# Patient Record
Sex: Female | Born: 1965 | Race: Black or African American | Hispanic: No | Marital: Married | Smoking: Current every day smoker
Health system: Southern US, Community
[De-identification: ages and names within clinical notes are randomized; demographics above are authoritative.]

## PROBLEM LIST (undated history)

## (undated) DIAGNOSIS — R531 Weakness: Secondary | ICD-10-CM

## (undated) DIAGNOSIS — T7840XA Allergy, unspecified, initial encounter: Secondary | ICD-10-CM

## (undated) DIAGNOSIS — K219 Gastro-esophageal reflux disease without esophagitis: Secondary | ICD-10-CM

## (undated) DIAGNOSIS — J45909 Unspecified asthma, uncomplicated: Secondary | ICD-10-CM

## (undated) DIAGNOSIS — D649 Anemia, unspecified: Secondary | ICD-10-CM

## (undated) DIAGNOSIS — J309 Allergic rhinitis, unspecified: Secondary | ICD-10-CM

## (undated) DIAGNOSIS — E785 Hyperlipidemia, unspecified: Secondary | ICD-10-CM

## (undated) DIAGNOSIS — R55 Syncope and collapse: Secondary | ICD-10-CM

## (undated) DIAGNOSIS — R011 Cardiac murmur, unspecified: Secondary | ICD-10-CM

## (undated) DIAGNOSIS — J9801 Acute bronchospasm: Secondary | ICD-10-CM

## (undated) DIAGNOSIS — R748 Abnormal levels of other serum enzymes: Secondary | ICD-10-CM

## (undated) DIAGNOSIS — R42 Dizziness and giddiness: Secondary | ICD-10-CM

## (undated) DIAGNOSIS — R079 Chest pain, unspecified: Secondary | ICD-10-CM

## (undated) DIAGNOSIS — I10 Essential (primary) hypertension: Secondary | ICD-10-CM

## (undated) DIAGNOSIS — H538 Other visual disturbances: Secondary | ICD-10-CM

## (undated) DIAGNOSIS — Z720 Tobacco use: Secondary | ICD-10-CM

## (undated) HISTORY — DX: Chest pain, unspecified: R07.9

## (undated) HISTORY — DX: Allergic rhinitis, unspecified: J30.9

## (undated) HISTORY — PX: TUBAL LIGATION: SHX77

## (undated) HISTORY — DX: Tobacco use: Z72.0

## (undated) HISTORY — DX: Acute bronchospasm: J98.01

## (undated) HISTORY — DX: Unspecified asthma, uncomplicated: J45.909

## (undated) HISTORY — DX: Hyperlipidemia, unspecified: E78.5

## (undated) HISTORY — DX: Other visual disturbances: H53.8

## (undated) HISTORY — DX: Dizziness and giddiness: R42

## (undated) HISTORY — DX: Gastro-esophageal reflux disease without esophagitis: K21.9

## (undated) HISTORY — DX: Abnormal levels of other serum enzymes: R74.8

## (undated) HISTORY — DX: Essential (primary) hypertension: I10

## (undated) HISTORY — DX: Cardiac murmur, unspecified: R01.1

## (undated) HISTORY — DX: Anemia, unspecified: D64.9

## (undated) HISTORY — DX: Weakness: R53.1

## (undated) HISTORY — DX: Syncope and collapse: R55

## (undated) HISTORY — DX: Allergy, unspecified, initial encounter: T78.40XA

---

## 2009-04-02 ENCOUNTER — Emergency Department (HOSPITAL_COMMUNITY): Admission: EM | Admit: 2009-04-02 | Discharge: 2009-04-02 | Payer: Self-pay | Admitting: Emergency Medicine

## 2009-07-02 ENCOUNTER — Emergency Department (HOSPITAL_COMMUNITY): Admission: EM | Admit: 2009-07-02 | Discharge: 2009-07-02 | Payer: Self-pay | Admitting: Emergency Medicine

## 2010-06-10 ENCOUNTER — Emergency Department (HOSPITAL_COMMUNITY)
Admission: EM | Admit: 2010-06-10 | Discharge: 2010-06-10 | Disposition: A | Discharge status: Home / Self Care | Payer: Self-pay | Attending: Emergency Medicine | Admitting: Emergency Medicine

## 2010-06-10 DIAGNOSIS — M79609 Pain in unspecified limb: Secondary | ICD-10-CM | POA: Insufficient documentation

## 2010-06-10 LAB — DIFFERENTIAL
Basophils Relative: 1 % (ref 0–1)
Eosinophils Absolute: 0.1 10*3/uL (ref 0.0–0.7)
Eosinophils Relative: 2 % (ref 0–5)
Lymphocytes Relative: 37 % (ref 12–46)
Monocytes Absolute: 0.4 10*3/uL (ref 0.1–1.0)
Neutro Abs: 3.6 10*3/uL (ref 1.7–7.7)
Neutrophils Relative %: 54 % (ref 43–77)

## 2010-06-10 LAB — CBC
HCT: 35.6 % — ABNORMAL LOW (ref 36.0–46.0)
Hemoglobin: 12.3 g/dL (ref 12.0–15.0)
MCH: 31.9 pg (ref 26.0–34.0)

## 2010-06-10 LAB — BASIC METABOLIC PANEL
Calcium: 9.6 mg/dL (ref 8.4–10.5)
Creatinine, Ser: 0.63 mg/dL (ref 0.4–1.2)
GFR calc non Af Amer: >60 mL/min (ref >60)
Sodium: 134 mEq/L — ABNORMAL LOW (ref 135–145)

## 2019-09-14 DIAGNOSIS — R55 Syncope and collapse: Secondary | ICD-10-CM | POA: Insufficient documentation

## 2019-09-28 ENCOUNTER — Ambulatory Visit (INDEPENDENT_AMBULATORY_CARE_PROVIDER_SITE_OTHER): Payer: Self-pay | Admitting: Interventional Cardiology

## 2019-09-28 ENCOUNTER — Other Ambulatory Visit: Payer: Self-pay

## 2019-09-28 ENCOUNTER — Telehealth: Payer: Self-pay | Admitting: Radiology

## 2019-09-28 ENCOUNTER — Encounter: Payer: Self-pay | Admitting: Interventional Cardiology

## 2019-09-28 VITALS — BP 130/72 | HR 102 | Ht 64.0 in | Wt 169.8 lb

## 2019-09-28 DIAGNOSIS — I10 Essential (primary) hypertension: Secondary | ICD-10-CM

## 2019-09-28 DIAGNOSIS — E785 Hyperlipidemia, unspecified: Secondary | ICD-10-CM

## 2019-09-28 DIAGNOSIS — Z7189 Other specified counseling: Secondary | ICD-10-CM

## 2019-09-28 DIAGNOSIS — Z720 Tobacco use: Secondary | ICD-10-CM

## 2019-09-28 DIAGNOSIS — R079 Chest pain, unspecified: Secondary | ICD-10-CM

## 2019-09-28 DIAGNOSIS — R55 Syncope and collapse: Secondary | ICD-10-CM

## 2019-09-28 NOTE — Patient Instructions (Addendum)
Medication Instructions:  Your physician recommends that you continue on your current medications as directed. Please refer to the Current Medication list given to you today.  *If you need a refill on your cardiac medications before your next appointment, please call your pharmacy*   Lab Work: None If you have labs (blood work) drawn today and your tests are completely normal, you will receive your results only by: . MyChart Message (if you have MyChart) OR . A paper copy in the mail If you have any lab test that is abnormal or we need to change your treatment, we will call you to review the results.   Testing/Procedures: Your physician has recommended that you wear an event monitor. Event monitors are medical devices that record the heart's electrical activity. Doctors most often us these monitors to diagnose arrhythmias. Arrhythmias are problems with the speed or rhythm of the heartbeat. The monitor is a small, portable device. You can wear one while you do your normal daily activities. This is usually used to diagnose what is causing palpitations/syncope (passing out).    Follow-Up: At [facility], you and your health needs are our priority.  As part of our continuing mission to provide you with exceptional heart care, we have created designated Provider Care Teams.  These Care Teams include your primary Cardiologist (physician) and Advanced Practice Providers (APPs -  Physician Assistants and Nurse Practitioners) who all work together to provide you with the care you need, when you need it.  We recommend signing up for the patient portal called "MyChart".  Sign up information is provided on this After Visit Summary.  MyChart is used to connect with patients for Virtual Visits (Telemedicine).  Patients are able to view lab/test results, encounter notes, upcoming appointments, etc.  Non-urgent messages can be sent to your provider as well.   To learn more about what you can do with  MyChart, go to [email or web address].    Your next appointment:   As needed  The format for your next appointment:   In Person  Provider:   You may see Dr. Henry Smith or one of the following Advanced Practice Providers on your designated Care Team:    Lori Gerhardt, NP  Laura Ingold, NP  Jill McDaniel, NP    Other Instructions  [employer] Cardiac Event Monitor Instructions Your physician has requested you wear your cardiac event monitor for 30 days. [employer] may call or text to confirm a shipping address. The monitor will be sent to a land address via UPS. [employer] will not ship a monitor to a PO BOX. It typically takes 3-5 days to receive your monitor after it has been enrolled. [employer] will assist with USPS tracking if your package is delayed. The telephone number for [employer] is [phone]. Once you have received your monitor, please review the enclosed instructions. Instruction tutorials can also be viewed under help and settings on the enclosed cell phone. Your monitor has already been registered assigning a specific monitor serial # to you.  Applying the monitor Remove cell phone from case and turn it on. The cell phone works as your transmitter and needs to be within 10 feet of you at all times. The cell phone will need to be charged on a daily basis. We recommend you plug the cell phone into the enclosed charger at your bedside table every night.  Monitor batteries: You will receive two monitor batteries labelled #1 and #2. These are your recorders. Plug battery #2 onto the second connection   on the enclosed charger. Keep one battery on the charger at all times. This will keep the monitor battery deactivated. It will also keep it fully charged for when you need to switch your monitor batteries. A small light will be blinking on the battery emblem when it is charging. The light on the battery emblem will remain on when the battery is fully  charged.  Open package of a Monitor strip. Insert battery #1 into black hood on strip and gently squeeze monitor battery onto connection as indicated in instruction booklet. Set aside while preparing skin.  Choose location for your strip, vertical or horizontal, as indicated in the instruction booklet. Shave to remove all hair from location. There cannot be any lotions, oils, powders, or colognes on skin where monitor is to be applied. Wipe skin clean with enclosed Saline wipe. Dry skin completely.  Peel paper labeled #1 off the back of the Monitor strip exposing the adhesive. Place the monitor on the chest in the vertical or horizontal position shown in the instruction booklet. One arrow on the monitor strip must be pointing upward. Carefully remove paper labeled #2, attaching remainder of strip to your skin. Try not to create any folds or wrinkles in the strip as you apply it.  Firmly press and release the circle in the center of the monitor battery. You will hear a small beep. This is turning the monitor battery on. The heart emblem on the monitor battery will light up every 5 seconds if the monitor battery in turned on and connected to the patient securely. Do not push and hold the circle down as this turns the monitor battery off. The cell phone will locate the monitor battery. A screen will appear on the cell phone checking the connection of your monitor strip. This may read poor connection initially but change to good connection within the next minute. Once your monitor accepts the connection you will hear a series of 3 beeps followed by a climbing crescendo of beeps. A screen will appear on the cell phone showing the two monitor strip placement options. Touch the picture that demonstrates where you applied the monitor strip.  Your monitor strip and battery are waterproof. You are able to shower, bathe, or swim with the monitor on. They just ask you do not submerge deeper than 3 feet  underwater. We recommend removing the monitor if you are swimming in a lake, river, or ocean.  Your monitor battery will need to be switched to a fully charged monitor battery approximately once a week. The cell phone will alert you of an action which needs to be made.  On the cell phone, tap for details to reveal connection status, monitor battery status, and cell phone battery status. The green dots indicates your monitor is in good status. A red dot indicates there is something that needs your attention.  To record a symptom, click the circle on the monitor battery. In 30-60 seconds a list of symptoms will appear on the cell phone. Select your symptom and tap save. Your monitor will record a sustained or significant arrhythmia regardless of you clicking the button. Some patients do not feel the heart rhythm irregularities. [employer] will notify us of any serious or critical events.  Refer to instruction booklet for instructions on switching batteries, changing strips, the Do not disturb or Pause features, or any additional questions.  Call [employer] at [phone], to confirm your monitor is transmitting and record your baseline. They will answer any questions you   may have regarding the monitor instructions at that time.  Returning the monitor to [employer] Place all equipment back into blue box. Peel off strip of paper to expose adhesive and close box securely. There is a prepaid UPS shipping label on this box. Drop in a UPS drop box, or at a UPS facility like [employer]. You may also contact [employer] to arrange UPS to pick up monitor package at your home.  

## 2019-09-28 NOTE — Progress Notes (Signed)
Cardiology Office Note:    Date:  09/28/2019   ID:  Haley Nielsen, DOB November 16, 1965, MRN [id number]  PCP:  System, Pcp Not In  Cardiologist:  No primary care provider on file.   Referring MD: Health, Andre Lefort*   Chief Complaint  Patient presents with  . Chest Pain  . Hyperlipidemia  . Hypertension  . Loss of Consciousness    History of Present Illness:    Haley Nielsen is a 54 y.o. female with a hx of chest pain, being referred from the emergency department for further evaluation.  Medical problems include severe hyperlipidemia (potentially familial hypercholesterolemia), tobacco abuse, essential hypertension, presyncope/syncope, and history of bronchospasm.  This is a difficult situation in this female.  She is referred for chest discomfort.  She was seen in late August in the [facility] emergency room.  Delta troponin levels were unremarkable.  An EKG was done and was abnormal but it was related to lead position irregularity.  Repeat EKG today is absolutely normal.  She describes mid back discomfort lower back discomfort and neck stiffness.  She has been having sharp shooting pains around her lower rib cage.  The discomfort feels like electrical shocks and burning.  When it occurs it can last up to 5 minutes.  There is no associated shortness of breath.  The discomfort is not brought on by physical activity.  Coughing or sneezing can sometimes precipitate an episode.  This began occurring on September 14, 2019.  On 09/14/2019 she had an episode of syncope.  She was outdoors feeding her dog and the next thing she knew she was on the ground.  She had a similar episode the next day but fell onto bed.  There was no premonitory symptom.  She has not had fainting in the past.  Past Medical History:  Diagnosis Date  . Allergic rhinitis   . Blurry vision   . Bronchospasm   . Chest pain   . Dizziness   . Elevated CK-MB level   . GERD (gastroesophageal reflux disease)   .  Hyperlipidemia   . Hypertension   . Syncope   . Syncope   . Tobacco abuse   . Weakness     History reviewed. No pertinent surgical history.  Current Medications: Current Meds  Medication Sig  . albuterol (VENTOLIN HFA) 108 (90 Base) MCG/ACT inhaler Inhale into the lungs every 6 (six) hours as needed for wheezing or shortness of breath.  Marland Kitchen amLODipine (NORVASC) 10 MG tablet Take 10 mg by mouth daily.  Marland Kitchen aspirin EC 81 MG tablet Take 81 mg by mouth daily. Swallow whole.  . fluticasone (FLOVENT DISKUS) 50 MCG/BLIST diskus inhaler Inhale 1 puff into the lungs 2 (two) times daily.  Marland Kitchen lisinopril-hydrochlorothiazide (ZESTORETIC) 20-25 MG tablet Take 1 tablet by mouth daily.  Marland Kitchen loratadine (CLARITIN) 10 MG tablet Take 10 mg by mouth daily.  Marland Kitchen lovastatin (MEVACOR) 20 MG tablet Take 20 mg by mouth at bedtime.  . naproxen sodium (ALEVE) 220 MG tablet Take 220 mg by mouth.  . Omega-3 Fatty Acids (FISH OIL) 1000 MG CAPS Take by mouth.  . pantoprazole (PROTONIX) 40 MG tablet Take 40 mg by mouth daily.     Allergies:   Penicillins   Social History   Socioeconomic History  . Marital status: Married    Spouse name: Not on file  . Number of children: Not on file  . Years of education: Not on file  . Highest education level: Not on file  Occupational History  . Not on file  Tobacco Use  . Smoking status: Current Some Day Smoker  . Smokeless tobacco: Never Used  Substance and Sexual Activity  . Alcohol use: Not on file  . Drug use: Not on file  . Sexual activity: Not on file  Other Topics Concern  . Not on file  Social History Narrative  . Not on file   Social Determinants of Health   Financial Resource Strain:   . Difficulty of Paying Living Expenses: Not on file  Food Insecurity:   . Worried About Charity fundraiser in the Last Year: Not on file  . Ran Out of Food in the Last Year: Not on file  Transportation Needs:   . Lack of Transportation (Medical): Not on file  . Lack of  Transportation (Non-Medical): Not on file  Physical Activity:   . Days of Exercise per Week: Not on file  . Minutes of Exercise per Session: Not on file  Stress:   . Feeling of Stress : Not on file  Social Connections:   . Frequency of Communication with Friends and Family: Not on file  . Frequency of Social Gatherings with Friends and Family: Not on file  . Attends Religious Services: Not on file  . Active Member of Clubs or Organizations: Not on file  . Attends Archivist Meetings: Not on file  . Marital Status: Not on file     Family History: The patient's family history is not on file.  ROS:   Please see the history of present illness.    She smokes cigarettes, 3/day.  Has lower back and mid back discomfort.  Stiff neck.  All other systems reviewed and are negative.  EKGs/Labs/Other Studies Reviewed:    The following studies were reviewed today: Recent chest x-ray done at [facility] did not reveal evidence of bony abnormality or rib fracture.  EKG:  EKG reveals normal sinus rhythm today with normal appearance.  In contrast the EKG performed in the emergency room at [facility] appeared abnormal because of abnormal lead placement with Q waves in lead I and aVL and upright lead in aVR suggesting left arm right arm switch.  Recent Labs: No results found for requested labs within last 8760 hours.  Recent Lipid Panel No results found for: CHOL, TRIG, HDL, CHOLHDL, VLDL, LDLCALC, LDLDIRECT  Physical Exam:    VS:  BP 130/72   Pulse (!) 102   Ht 5\' 4"  (1.626 m)   Wt 169 lb 12.8 oz (77 kg)   SpO2 97%   BMI 29.15 kg/m     Wt Readings from Last 3 Encounters:  09/28/19 169 lb 12.8 oz (77 kg)     GEN: Appears older than stated age. No acute distress HEENT: Normal NECK: No JVD. LYMPHATICS: No lymphadenopathy CARDIAC:  RRR without murmur, gallop, or edema. VASCULAR:  Normal Pulses. No bruits. RESPIRATORY:  Clear to auscultation without rales, wheezing or  rhonchi  ABDOMEN: Soft, non-tender, non-distended, No pulsatile mass, MUSCULOSKELETAL: No deformity  SKIN: Warm and dry NEUROLOGIC:  Alert and oriented x 3 PSYCHIATRIC:  Normal affect   ASSESSMENT:    1. Syncope and collapse   2. Chest pain of uncertain etiology   3. Hyperlipidemia LDL goal <70   4. Essential hypertension   5. Tobacco abuse   6. Educated about COVID-19 virus infection    PLAN:    In order of problems listed above:  1. Etiology is uncertain.  Possible neurally  mediated etiology.  30-day continuous monitor is ordered. 2. Chest pain appears to be radicular and emanating from the mid back.  Possible injury to the disc or spine related to the syncopal episode.  She needs imaging of her back.  This should be carried out by primary care. 3. Severe elevation in lipids suggesting familial hypercholesterolemia.  Agree with the addition of Mevacor. 4. Blood pressure is much better controlled today compared to the report from the emergency room.  Continue lisinopril HCTZ 5. Recommended smoking cessation. 6. Recommended mitigation of potential COVID-19 exposure.  Vaccination.  Booster shot when available.  I do not believe further cardiac evaluation is needed at this time.  A 30-day monitor will be done to rule out arrhythmia.  She should not drive for at least 6 months.  In addition to the mid back discomfort with radiating left chest pain, she has numbness in her feet.  I worry that she may have a spinal cord compression problem in the thoracic spine.  This needs to be managed immediately.   Medication Adjustments/Labs and Tests Ordered: Current medicines are reviewed at length with the patient today.  Concerns regarding medicines are outlined above.  Orders Placed This Encounter  Procedures  . Cardiac event monitor  . EKG 12-Lead   No orders of the defined types were placed in this encounter.   Patient Instructions  Medication Instructions:  Your physician  recommends that you continue on your current medications as directed. Please refer to the Current Medication list given to you today.  *If you need a refill on your cardiac medications before your next appointment, please call your pharmacy*   Lab Work: None If you have labs (blood work) drawn today and your tests are completely normal, you will receive your results only by: Marland Kitchen MyChart Message (if you have MyChart) OR . A paper copy in the mail If you have any lab test that is abnormal or we need to change your treatment, we will call you to review the results.   Testing/Procedures: Your physician has recommended that you wear an event monitor. Event monitors are medical devices that record the heart's electrical activity. Doctors most often [place] these monitors to diagnose arrhythmias. Arrhythmias are problems with the speed or rhythm of the heartbeat. The monitor is a small, portable device. You can wear one while you do your normal daily activities. This is usually used to diagnose what is causing palpitations/syncope (passing out).    Follow-Up: At [facility], you and your health needs are our priority.  As part of our continuing mission to provide you with exceptional heart care, we have created designated Provider Care Teams.  These Care Teams include your primary Cardiologist (physician) and Advanced Practice Providers (APPs -  Physician Assistants and Nurse Practitioners) who all work together to provide you with the care you need, when you need it.  We recommend signing up for the patient portal called "MyChart".  Sign up information is provided on this After Visit Summary.  MyChart is used to connect with patients for Virtual Visits (Telemedicine).  Patients are able to view lab/test results, encounter notes, upcoming appointments, etc.  Non-urgent messages can be sent to your provider as well.   To learn more about what you can do with MyChart, go to [email or web address].     Your next appointment:   As needed  The format for your next appointment:   In Person  Provider:   You may see Dr. Daneen Schick or  one of the following Advanced Practice Providers on your designated Care Team:    Truitt Merle, NP  Cecilie Kicks, NP  Kathyrn Drown, NP    Other Instructions      Signed, Sinclair Grooms, MD  09/28/2019 11:21 AM    [place]

## 2019-09-28 NOTE — Telephone Encounter (Signed)
Enrolled patient for a 30 day [employer] Event Monitor to be mailed to patients home  

## 2019-10-04 ENCOUNTER — Other Ambulatory Visit (HOSPITAL_COMMUNITY): Payer: Self-pay | Admitting: *Deleted

## 2019-10-04 DIAGNOSIS — M546 Pain in thoracic spine: Secondary | ICD-10-CM

## 2019-10-04 DIAGNOSIS — M545 Low back pain, unspecified: Secondary | ICD-10-CM

## 2019-10-06 ENCOUNTER — Other Ambulatory Visit: Payer: Self-pay

## 2019-10-06 ENCOUNTER — Ambulatory Visit (HOSPITAL_COMMUNITY)
Admission: RE | Admit: 2019-10-06 | Discharge: 2019-10-06 | Disposition: A | Discharge status: Home / Self Care | Payer: Self-pay | Source: Ambulatory Visit | Attending: *Deleted | Admitting: *Deleted

## 2019-10-06 DIAGNOSIS — M546 Pain in thoracic spine: Secondary | ICD-10-CM | POA: Insufficient documentation

## 2019-10-06 DIAGNOSIS — M545 Low back pain, unspecified: Secondary | ICD-10-CM

## 2019-10-12 ENCOUNTER — Encounter (INDEPENDENT_AMBULATORY_CARE_PROVIDER_SITE_OTHER): Payer: Self-pay

## 2019-10-12 DIAGNOSIS — R55 Syncope and collapse: Secondary | ICD-10-CM

## 2021-05-07 ENCOUNTER — Encounter: Payer: Self-pay | Admitting: Family Medicine

## 2021-05-07 ENCOUNTER — Ambulatory Visit: Payer: PRIVATE HEALTH INSURANCE | Admitting: Family Medicine

## 2021-05-07 ENCOUNTER — Telehealth: Payer: Self-pay | Admitting: Family Medicine

## 2021-05-07 VITALS — BP 212/114 | HR 89 | Temp 96.8°F | Ht 64.0 in | Wt 184.4 lb

## 2021-05-07 DIAGNOSIS — I700 Atherosclerosis of aorta: Secondary | ICD-10-CM

## 2021-05-07 DIAGNOSIS — K219 Gastro-esophageal reflux disease without esophagitis: Secondary | ICD-10-CM

## 2021-05-07 DIAGNOSIS — I10 Essential (primary) hypertension: Secondary | ICD-10-CM | POA: Diagnosis not present

## 2021-05-07 DIAGNOSIS — E6609 Other obesity due to excess calories: Secondary | ICD-10-CM | POA: Diagnosis not present

## 2021-05-07 DIAGNOSIS — Z833 Family history of diabetes mellitus: Secondary | ICD-10-CM

## 2021-05-07 DIAGNOSIS — J4530 Mild persistent asthma, uncomplicated: Secondary | ICD-10-CM

## 2021-05-07 DIAGNOSIS — R82998 Other abnormal findings in urine: Secondary | ICD-10-CM

## 2021-05-07 DIAGNOSIS — J309 Allergic rhinitis, unspecified: Secondary | ICD-10-CM

## 2021-05-07 DIAGNOSIS — Z6831 Body mass index (BMI) 31.0-31.9, adult: Secondary | ICD-10-CM

## 2021-05-07 LAB — MICROSCOPIC EXAMINATION
RBC, Urine: NONE SEEN /hpf (ref 0–2)
Renal Epithel, UA: NONE SEEN /hpf
WBC, UA: NONE SEEN /hpf (ref 0–5)

## 2021-05-07 LAB — URINALYSIS, ROUTINE W REFLEX MICROSCOPIC
Bilirubin, UA: NEGATIVE
Glucose, UA: NEGATIVE
Ketones, UA: NEGATIVE
Leukocytes,UA: NEGATIVE
Nitrite, UA: NEGATIVE
RBC, UA: NEGATIVE
Specific Gravity, UA: 1.025 (ref 1.005–1.030)
Urobilinogen, Ur: 0.2 mg/dL (ref 0.2–1.0)
pH, UA: 5.5 (ref 5.0–7.5)

## 2021-05-07 LAB — BAYER DCA HB A1C WAIVED: HB A1C (BAYER DCA - WAIVED): 5.3 % (ref 4.8–5.6)

## 2021-05-07 MED ORDER — ALBUTEROL SULFATE HFA 108 (90 BASE) MCG/ACT IN AERS: 1.0000 | INHALATION_SPRAY | RESPIRATORY_TRACT | 2 refills | Status: DC | PRN

## 2021-05-07 MED ORDER — LISINOPRIL-HYDROCHLOROTHIAZIDE 20-25 MG PO TABS: 1.0000 | ORAL_TABLET | Freq: Every day | ORAL | 0 refills | Status: DC

## 2021-05-07 MED ORDER — ASPIRIN EC 81 MG PO TBEC: 81.0000 mg | DELAYED_RELEASE_TABLET | Freq: Every day | ORAL | 3 refills | Status: DC

## 2021-05-07 MED ORDER — OMEPRAZOLE 20 MG PO CPDR: 20.0000 mg | DELAYED_RELEASE_CAPSULE | Freq: Every day | ORAL | 3 refills | Status: DC

## 2021-05-07 MED ORDER — LORATADINE 10 MG PO TABS: 10.0000 mg | ORAL_TABLET | Freq: Every day | ORAL | 3 refills | Status: DC

## 2021-05-07 MED ORDER — FLUTICASONE PROPIONATE 50 MCG/ACT NA SUSP: 2.0000 | Freq: Every day | NASAL | 6 refills | Status: DC

## 2021-05-07 MED ORDER — FLOVENT DISKUS 50 MCG/ACT IN AEPB: 1.0000 | INHALATION_SPRAY | Freq: Two times a day (BID) | RESPIRATORY_TRACT | 3 refills | Status: DC

## 2021-05-07 NOTE — Patient Instructions (Signed)
Hypertension, Adult High blood pressure (hypertension) is when the force of blood pumping through the arteries is too strong. The arteries are the blood vessels that carry blood from the heart throughout the body. Hypertension forces the heart to work harder to pump blood and may cause arteries to become narrow or stiff. Untreated or uncontrolled hypertension can lead to a heart attack, heart failure, a stroke, kidney disease, and other problems. A blood pressure reading consists of a higher number over a lower number. Ideally, your blood pressure should be below 120/80. The first ("top") number is called the systolic pressure. It is a measure of the pressure in your arteries as your heart beats. The second ("bottom") number is called the diastolic pressure. It is a measure of the pressure in your arteries as the heart relaxes. What are the causes? The exact cause of this condition is not known. There are some conditions that result in high blood pressure. What increases the risk? Certain factors may make you more likely to develop high blood pressure. Some of these risk factors are under your control, including: Smoking. Not getting enough exercise or physical activity. Being overweight. Having too much fat, sugar, calories, or salt (sodium) in your diet. Drinking too much alcohol. Other risk factors include: Having a personal history of heart disease, diabetes, high cholesterol, or kidney disease. Stress. Having a family history of high blood pressure and high cholesterol. Having obstructive sleep apnea. Age. The risk increases with age. What are the signs or symptoms? High blood pressure may not cause symptoms. Very high blood pressure (hypertensive crisis) may cause: Headache. Fast or irregular heartbeats (palpitations). Shortness of breath. Nosebleed. Nausea and vomiting. Vision changes. Severe chest pain, dizziness, and seizures. How is this diagnosed? This condition is diagnosed by  measuring your blood pressure while you are seated, with your arm resting on a flat surface, your legs uncrossed, and your feet flat on the floor. The cuff of the blood pressure monitor will be placed directly against the skin of your upper arm at the level of your heart. Blood pressure should be measured at least twice using the same arm. Certain conditions can cause a difference in blood pressure between your right and left arms. If you have a high blood pressure reading during one visit or you have normal blood pressure with other risk factors, you may be asked to: Return on a different day to have your blood pressure checked again. Monitor your blood pressure at home for 1 week or longer. If you are diagnosed with hypertension, you may have other blood or imaging tests to help your health care provider understand your overall risk for other conditions. How is this treated? This condition is treated by making healthy lifestyle changes, such as eating healthy foods, exercising more, and reducing your alcohol intake. You may be referred for counseling on a healthy diet and physical activity. Your health care provider may prescribe medicine if lifestyle changes are not enough to get your blood pressure under control and if: Your systolic blood pressure is above 130. Your diastolic blood pressure is above 80. Your personal target blood pressure may vary depending on your medical conditions, your age, and other factors. Follow these instructions at home: Eating and drinking  Eat a diet that is high in fiber and potassium, and low in sodium, added sugar, and fat. An example of this eating plan is called the DASH diet. DASH stands for Dietary Approaches to Stop Hypertension. To eat this way: Eat   plenty of fresh fruits and vegetables. Try to fill one half of your plate at each meal with fruits and vegetables. Eat whole grains, such as whole-wheat pasta, brown rice, or whole-grain bread. Fill about one  fourth of your plate with whole grains. Eat or drink low-fat dairy products, such as skim milk or low-fat yogurt. Avoid fatty cuts of meat, processed or cured meats, and poultry with skin. Fill about one fourth of your plate with lean proteins, such as fish, chicken without skin, beans, eggs, or tofu. Avoid pre-made and processed foods. These tend to be higher in sodium, added sugar, and fat. Reduce your daily sodium intake. Many people with hypertension should eat less than 1,500 mg of sodium a day. Do not drink alcohol if: Your health care provider tells you not to drink. You are pregnant, may be pregnant, or are planning to become pregnant. If you drink alcohol: Limit how much you have to: 0-1 drink a day for women. 0-2 drinks a day for men. Know how much alcohol is in your drink. In the [place] one drink equals one 12 oz bottle of beer (355 mL), one 5 oz glass of wine (148 mL), or one 1 oz glass of hard liquor (44 mL). Lifestyle  Work with your health care provider to maintain a healthy body weight or to lose weight. Ask what an ideal weight is for you. Get at least 30 minutes of exercise that causes your heart to beat faster (aerobic exercise) most days of the week. Activities may include walking, swimming, or biking. Include exercise to strengthen your muscles (resistance exercise), such as Pilates or lifting weights, as part of your weekly exercise routine. Try to do these types of exercises for 30 minutes at least 3 days a week. Do not use any products that contain nicotine or tobacco. These products include cigarettes, chewing tobacco, and vaping devices, such as e-cigarettes. If you need help quitting, ask your health care provider. Monitor your blood pressure at home as told by your health care provider. Keep all follow-up visits. This is important. Medicines Take over-the-counter and prescription medicines only as told by your health care provider. Follow directions carefully. Blood  pressure medicines must be taken as prescribed. Do not skip doses of blood pressure medicine. Doing this puts you at risk for problems and can make the medicine less effective. Ask your health care provider about side effects or reactions to medicines that you should watch for. Contact a health care provider if you: Think you are having a reaction to a medicine you are taking. Have headaches that keep coming back (recurring). Feel dizzy. Have swelling in your ankles. Have trouble with your vision. Get help right away if you: Develop a severe headache or confusion. Have unusual weakness or numbness. Feel faint. Have severe pain in your chest or abdomen. Vomit repeatedly. Have trouble breathing. These symptoms may be an emergency. Get help right away. Call 911. Do not wait to see if the symptoms will go away. Do not drive yourself to the hospital. Summary Hypertension is when the force of blood pumping through your arteries is too strong. If this condition is not controlled, it may put you at risk for serious complications. Your personal target blood pressure may vary depending on your medical conditions, your age, and other factors. For most people, a normal blood pressure is less than 120/80. Hypertension is treated with lifestyle changes, medicines, or a combination of both. Lifestyle changes include losing weight, eating a healthy,   low-sodium diet, exercising more, and limiting alcohol. This information is not intended to replace advice given to you by your health care provider. Make sure you discuss any questions you have with your health care provider. Document Revised: 11/12/2020 Document Reviewed: 11/12/2020 Elsevier Patient Education  2023 [employer]vier Inc.  

## 2021-05-07 NOTE — Progress Notes (Signed)
? ?New Patient Office Visit ? ?Subjective   ? ?Patient ID: Haley Nielsen, female    DOB: 11/01/1965  Age: 56 y.o. MRN: [id number] ? ?CC:  ?Chief Complaint  ?Patient presents with  ? New Patient (Initial Visit)  ? ? ?HPI ?Haley Nielsen presents to establish care. She has a history of HTN. She reports his BP is often 825 or higher systolic. She has not been on medication in over a year. She denies chest pain, shortness of breath, dizziness, edema, diaphoresis, visual disturbances, or focal weakness. ? ?She also reports a history of GERD. Has been off medication for a year for this as well. She reports heartburn at times and water brash. Denies nausea, vomiting, weight loss, or bleeding.  ? ?Has hx of asthma. Is has been out of her inhaler for a year. She has been using her husband's albuterol intermittently for wheezing. Also reports chronic cough and nasal congestion due to seasonal allergies.  ? ? ? ?Outpatient Encounter Medications as of 05/07/2021  ?Medication Sig  ? albuterol (VENTOLIN HFA) 108 (90 Base) MCG/ACT inhaler Inhale into the lungs every 6 (six) hours as needed for wheezing or shortness of breath.  ? amLODipine (NORVASC) 10 MG tablet Take 10 mg by mouth daily.  ? aspirin EC 81 MG tablet Take 81 mg by mouth daily. Swallow whole.  ? fluticasone (FLOVENT DISKUS) 50 MCG/BLIST diskus inhaler Inhale 1 puff into the lungs 2 (two) times daily.  ? lisinopril-hydrochlorothiazide (ZESTORETIC) 20-25 MG tablet Take 1 tablet by mouth daily.  ? loratadine (CLARITIN) 10 MG tablet Take 10 mg by mouth daily.  ? lovastatin (MEVACOR) 20 MG tablet Take 20 mg by mouth at bedtime.  ? naproxen sodium (ALEVE) 220 MG tablet Take 220 mg by mouth.  ? Omega-3 Fatty Acids (FISH OIL) 1000 MG CAPS Take by mouth.  ? pantoprazole (PROTONIX) 40 MG tablet Take 40 mg by mouth daily.  ? ?No facility-administered encounter medications on file as of 05/07/2021.  ? ? ?Past Medical History:  ?Diagnosis Date  ? Allergic rhinitis   ? Allergy   ?  Blurry vision   ? Bronchospasm   ? Chest pain   ? Dizziness   ? Elevated CK-MB level   ? GERD (gastroesophageal reflux disease)   ? Hyperlipidemia   ? Hypertension   ? Syncope   ? Syncope   ? Tobacco abuse   ? Weakness   ? ? ?Past Surgical History:  ?Procedure Laterality Date  ? TUBAL LIGATION    ? ? ?Family History  ?Problem Relation Age of Onset  ? Hyperlipidemia Mother   ? Hypertension Mother   ? Stroke Father 19  ? Heart attack Maternal Grandmother   ? Cancer Paternal Grandmother   ?     cervical cancer  ? ? ?Social History  ? ?Socioeconomic History  ? Marital status: Married  ?  Spouse name: Not on file  ? Number of children: Not on file  ? Years of education: 67  ? Highest education level: 11th grade  ?Occupational History  ? Not on file  ?Tobacco Use  ? Smoking status: Every Day  ?  Packs/day: 0.25  ?  Years: 15.00  ?  Pack years: 3.75  ?  Types: Cigarettes  ? Smokeless tobacco: Never  ?Vaping Use  ? Vaping Use: Never used  ?Substance and Sexual Activity  ? Alcohol use: Not Currently  ? Drug use: Not Currently  ? Sexual activity: Yes  ?  Birth control/protection: Surgical  ?  Other Topics Concern  ? Not on file  ?Social History Narrative  ? Not on file  ? ?Social Determinants of Health  ? ?Financial Resource Strain: Not on file  ?Food Insecurity: Not on file  ?Transportation Needs: Not on file  ?Physical Activity: Not on file  ?Stress: Not on file  ?Social Connections: Not on file  ?Intimate Partner Violence: Not on file  ? ? ?ROS ?As per HPI.  ?  ? ? ?Objective   ? ?BP (!) 212/114   Pulse 89   Temp (!) 96.8 ?F (36 ?C) (Temporal)   Ht _0  (1.626 m)   Wt 184 lb 6 oz (83.6 kg)   SpO2 97%   BMI 31.65 kg/m?  ? ?Physical Exam ?Vitals and nursing note reviewed.  ?Constitutional:   ?   General: She is not in acute distress. ?   Appearance: She is not ill-appearing, toxic-appearing or diaphoretic.  ?HENT:  ?   Head: Normocephalic and atraumatic.  ?   Nose: Congestion present.  ?   Mouth/Throat:  ?   Mouth:  Mucous membranes are moist.  ?   Pharynx: Oropharynx is clear.  ?Eyes:  ?   Extraocular Movements: Extraocular movements intact.  ?   Pupils: Pupils are equal, round, and reactive to light.  ?Neck:  ?   Vascular: No carotid bruit or JVD.  ?Cardiovascular:  ?   Rate and Rhythm: Normal rate and regular rhythm.  ?   Heart sounds: Normal heart sounds. No murmur heard. ?Pulmonary:  ?   Effort: Pulmonary effort is normal.  ?   Breath sounds: Normal breath sounds.  ?Abdominal:  ?   General: Bowel sounds are normal. There is no distension.  ?   Palpations: Abdomen is soft.  ?   Tenderness: There is no abdominal tenderness. There is no guarding or rebound.  ?Musculoskeletal:  ?   Right lower leg: No edema.  ?   Left lower leg: No edema.  ?Lymphadenopathy:  ?   Cervical: No cervical adenopathy.  ?Skin: ?   General: Skin is warm and dry.  ?Neurological:  ?   General: No focal deficit present.  ?   Mental Status: She is alert and oriented to person, place, and time.  ?Psychiatric:     ?   Mood and Affect: Mood normal.     ?   Behavior: Behavior normal.  ? ? ? ?  ? ?Assessment & Plan:  ? ?Haley Nielsen was seen today for new patient (initial visit). ? ?Diagnoses and all orders for this visit: ? ?Uncontrolled hypertension ?BP 212/114 today. Has been out of medication x1 year. EKG done today- no significant changes from previous, sinus rhythm. Recommend ED evaluation given significant elevated BP, patient refused. Restart lisinopril-hctz. BP log given. Labs pending as below. Will follow up in 2 weeks for recheck, sooner if needed. Strict return precautions given.  ?-     EKG 12-Lead ?-     Microalbumin / creatinine urine ratio ?-     CBC with Differential/Platelet ?-     CMP14+EGFR ?-     Lipid panel ?-     Thyroid Panel With TSH ?-     lisinopril-hydrochlorothiazide (ZESTORETIC) 20-25 MG tablet; Take 1 tablet by mouth daily. ? ?Aortic atherosclerosis ([place]) ?Restart daily aspiring. Will restart stating pending lab results.  ?-     CBC  with Differential/Platelet ?-     CMP14+EGFR ?-     Lipid panel ?-  aspirin EC 81 MG tablet; Take 1 tablet (81 mg total) by mouth daily. Swallow whole. ? ?Class 1 obesity due to excess calories with serious comorbidity and body mass index (BMI) of 31.0 to 31.9 in adult ?A1c 5.3 today.  ?-     Bayer DCA Hb A1c Waived ? ?Dark urine ?Negative UA today. Increase hydration.  ?-     Urinalysis, Routine w reflex microscopic ?-     Microscopic Examination ? ?Mild persistent asthma without complication ?Restart maintenance and rescue inhaler.  ?-     albuterol (VENTOLIN HFA) 108 (90 Base) MCG/ACT inhaler; Inhale 1-2 puffs into the lungs every 4 (four) hours as needed for wheezing or shortness of breath. ?-     Fluticasone Propionate, Inhal, (FLOVENT DISKUS) 50 MCG/ACT AEPB; Inhale 1 puff into the lungs in the morning and at bedtime. ? ?Gastroesophageal reflux disease, unspecified whether esophagitis present ?Will restart PPI today.  ?-     omeprazole (PRILOSEC) 20 MG capsule; Take 1 capsule (20 mg total) by mouth daily. ? ?Chronic allergic rhinitis ?Continue claritin. Restart flonase.  ?-     fluticasone (FLONASE) 50 MCG/ACT nasal spray; Place 2 sprays into both nostrils daily. ?-     loratadine (CLARITIN) 10 MG tablet; Take 1 tablet (10 mg total) by mouth daily. ? ?Family history of diabetes mellitus ?-     Bayer DCA Hb A1c Waived ? ?Return in about 2 weeks (around 05/21/2021) for BP and labs.  ? ?The patient indicates understanding of these issues and agrees with the plan. ? ?Gwenlyn Perking, FNP ? ? ?

## 2021-05-07 NOTE — Telephone Encounter (Signed)
Fluticasone Propionate, Inhal, (FLOVENT DISKUS) 50 MCG/ACT AEPB is $145 for month supply. Pt wants to know if something else can be called in. Please call back ?

## 2021-05-08 ENCOUNTER — Other Ambulatory Visit: Payer: Self-pay | Admitting: Family Medicine

## 2021-05-08 ENCOUNTER — Telehealth: Payer: Self-pay | Admitting: Family Medicine

## 2021-05-08 DIAGNOSIS — J4530 Mild persistent asthma, uncomplicated: Secondary | ICD-10-CM

## 2021-05-08 DIAGNOSIS — I700 Atherosclerosis of aorta: Secondary | ICD-10-CM

## 2021-05-08 DIAGNOSIS — E782 Mixed hyperlipidemia: Secondary | ICD-10-CM

## 2021-05-08 LAB — CBC WITH DIFFERENTIAL/PLATELET
Basophils Absolute: 0.1 10*3/uL (ref 0.0–0.2)
Basos: 1 %
EOS (ABSOLUTE): 0.3 10*3/uL (ref 0.0–0.4)
Eos: 4 %
Hematocrit: 42.2 % (ref 34.0–46.6)
Hemoglobin: 14.6 g/dL (ref 11.1–15.9)
Immature Grans (Abs): 0 10*3/uL (ref 0.0–0.1)
Immature Granulocytes: 0 %
Lymphocytes Absolute: 2.7 10*3/uL (ref 0.7–3.1)
Lymphs: 32 %
MCH: 31.2 pg (ref 26.6–33.0)
MCHC: 34.6 g/dL (ref 31.5–35.7)
MCV: 90 fL (ref 79–97)
Monocytes Absolute: 0.6 10*3/uL (ref 0.1–0.9)
Monocytes: 7 %
Neutrophils Absolute: 4.9 10*3/uL (ref 1.4–7.0)
Neutrophils: 56 %
Platelets: 338 10*3/uL (ref 150–450)
RBC: 4.68 x10E6/uL (ref 3.77–5.28)
RDW: 12.9 % (ref 11.7–15.4)
WBC: 8.6 10*3/uL (ref 3.4–10.8)

## 2021-05-08 LAB — CMP14+EGFR
ALT: 23 IU/L (ref 0–32)
AST: 19 IU/L (ref 0–40)
Albumin/Globulin Ratio: 1.8 (ref 1.2–2.2)
Albumin: 4.4 g/dL (ref 3.8–4.9)
Alkaline Phosphatase: 114 IU/L (ref 44–121)
BUN/Creatinine Ratio: 13 (ref 9–23)
BUN: 8 mg/dL (ref 6–24)
Bilirubin Total: 0.3 mg/dL (ref 0.0–1.2)
CO2: 24 mmol/L (ref 20–29)
Calcium: 9.7 mg/dL (ref 8.7–10.2)
Chloride: 104 mmol/L (ref 96–106)
Creatinine, Ser: 0.62 mg/dL (ref 0.57–1.00)
Globulin, Total: 2.4 g/dL (ref 1.5–4.5)
Glucose: 93 mg/dL (ref 70–99)
Potassium: 4.6 mmol/L (ref 3.5–5.2)
Sodium: 144 mmol/L (ref 134–144)
Total Protein: 6.8 g/dL (ref 6.0–8.5)
eGFR: 104 mL/min/{1.73_m2} (ref >59)

## 2021-05-08 LAB — LIPID PANEL
Chol/HDL Ratio: 4.8 ratio — ABNORMAL HIGH (ref 0.0–4.4)
Cholesterol, Total: 252 mg/dL — ABNORMAL HIGH (ref 100–199)
HDL: 52 mg/dL (ref >39)
LDL Chol Calc (NIH): 164 mg/dL — ABNORMAL HIGH (ref 0–99)
Triglycerides: 195 mg/dL — ABNORMAL HIGH (ref 0–149)
VLDL Cholesterol Cal: 36 mg/dL (ref 5–40)

## 2021-05-08 LAB — MICROALBUMIN / CREATININE URINE RATIO
Creatinine, Urine: 99.8 mg/dL
Microalb/Creat Ratio: 269 mg/g creat — ABNORMAL HIGH (ref 0–29)
Microalbumin, Urine: 268 ug/mL

## 2021-05-08 LAB — THYROID PANEL WITH TSH
Free Thyroxine Index: 1.6 (ref 1.2–4.9)
T3 Uptake Ratio: 26 % (ref 24–39)
T4, Total: 6.1 ug/dL (ref 4.5–12.0)
TSH: 1.33 u[IU]/mL (ref 0.450–4.500)

## 2021-05-08 MED ORDER — ATORVASTATIN CALCIUM 40 MG PO TABS: 40.0000 mg | ORAL_TABLET | Freq: Every day | ORAL | 3 refills | Status: DC

## 2021-05-08 MED ORDER — ARNUITY ELLIPTA 100 MCG/ACT IN AEPB: 1.0000 | INHALATION_SPRAY | Freq: Every day | RESPIRATORY_TRACT | 6 refills | Status: DC

## 2021-05-08 MED ORDER — FLUTICASONE PROPIONATE HFA 44 MCG/ACT IN AERO: 2.0000 | INHALATION_SPRAY | Freq: Two times a day (BID) | RESPIRATORY_TRACT | 12 refills | Status: DC

## 2021-05-08 NOTE — Telephone Encounter (Signed)
Different rx sent

## 2021-05-08 NOTE — Telephone Encounter (Signed)
I sent in [place]. This is showing in epic as preferred level 1 for her insurance.  ?

## 2021-05-08 NOTE — Telephone Encounter (Signed)
[place] called to let PCP know that pts insurance is not covering [place] Rx that was sent in for pt either.  ?

## 2021-05-08 NOTE — Progress Notes (Signed)
Patient returning call. Please call back

## 2021-05-08 NOTE — Telephone Encounter (Signed)
Patient informed. 

## 2021-05-08 NOTE — Telephone Encounter (Signed)
LM informing patient of RX sent to pharmacy. Advised to call back with questions or concerns.  ? ?Haley Nielsen. LPN  ? ?

## 2021-05-09 ENCOUNTER — Telehealth: Payer: Self-pay | Admitting: Family Medicine

## 2021-05-09 NOTE — Telephone Encounter (Signed)
Patient said she went to the pharmacy to pick up fluticasone (FLOVENT HFA) 44 MCG/ACT inhaler and it was too expensive. She would like to know if anything else can be called in. Please call back.  ?

## 2021-05-12 NOTE — Telephone Encounter (Signed)
Pt aware of provider feedback and voiced understanding. She states she will call her insurance and then let us know what is covered. ?

## 2021-05-12 NOTE — Telephone Encounter (Signed)
I've sent in the options that are showing as preferred level 1 for her insurance. She can look for a copay card or contact her insurance to see what would be best covered  ?

## 2021-05-15 ENCOUNTER — Encounter: Payer: Self-pay | Admitting: Family Medicine

## 2021-05-15 DIAGNOSIS — G629 Polyneuropathy, unspecified: Secondary | ICD-10-CM | POA: Insufficient documentation

## 2021-05-15 HISTORY — DX: Polyneuropathy, unspecified: G62.9

## 2021-05-15 NOTE — Telephone Encounter (Signed)
Pt insurance will cover fluticasone. Should be $5. Pt said to go ahead with the testing for colonoscopy and shingles. Insurance will cover. ?

## 2021-05-15 NOTE — Telephone Encounter (Signed)
LMTCB as it looks like fluticasone has already been sent in so just wanted to verify with the pt. ?

## 2021-05-16 ENCOUNTER — Other Ambulatory Visit: Payer: Self-pay | Admitting: Family Medicine

## 2021-05-16 DIAGNOSIS — Z1231 Encounter for screening mammogram for malignant neoplasm of breast: Secondary | ICD-10-CM

## 2021-05-21 ENCOUNTER — Ambulatory Visit
Admission: RE | Admit: 2021-05-21 | Discharge: 2021-05-21 | Disposition: A | Discharge status: Home / Self Care | Payer: Managed Care, Other (non HMO) | Source: Ambulatory Visit

## 2021-05-21 DIAGNOSIS — Z1231 Encounter for screening mammogram for malignant neoplasm of breast: Secondary | ICD-10-CM

## 2021-05-22 ENCOUNTER — Ambulatory Visit: Payer: PRIVATE HEALTH INSURANCE | Admitting: Family Medicine

## 2021-05-22 ENCOUNTER — Encounter: Payer: Self-pay | Admitting: Family Medicine

## 2021-05-22 VITALS — BP 118/76 | HR 98 | Temp 98.6°F | Resp 18 | Ht 64.0 in | Wt 184.4 lb

## 2021-05-22 DIAGNOSIS — Z23 Encounter for immunization: Secondary | ICD-10-CM

## 2021-05-22 DIAGNOSIS — I10 Essential (primary) hypertension: Secondary | ICD-10-CM

## 2021-05-22 DIAGNOSIS — J4530 Mild persistent asthma, uncomplicated: Secondary | ICD-10-CM | POA: Diagnosis not present

## 2021-05-22 DIAGNOSIS — Z1211 Encounter for screening for malignant neoplasm of colon: Secondary | ICD-10-CM

## 2021-05-22 DIAGNOSIS — J4540 Moderate persistent asthma, uncomplicated: Secondary | ICD-10-CM | POA: Insufficient documentation

## 2021-05-22 DIAGNOSIS — M62838 Other muscle spasm: Secondary | ICD-10-CM | POA: Diagnosis not present

## 2021-05-22 MED ORDER — FLUTICASONE PROPIONATE HFA 220 MCG/ACT IN AERO: 1.0000 | INHALATION_SPRAY | Freq: Two times a day (BID) | RESPIRATORY_TRACT | 12 refills | Status: DC

## 2021-05-22 NOTE — Progress Notes (Signed)
? ?Established Patient Office Visit ? ?Subjective   ?Patient ID: Haley Nielsen, female    DOB: November 19, 1965  Age: 56 y.o. MRN: [id number] ? ?Chief Complaint  ?Patient presents with  ? Blood Pressure Check  ?  Pt states it has been really good , 118/76 , states in the morning before her medicine it ranges around 140/80 KH/CMA  ? ? ?HPI ? ?HTN ?Complaint with meds - yes, but did not take them this morning before visit ?Current Medications - HCTZ-lisinopril ?Checking BP at home ranging 110-130s/70-80s ?Pertinent ROS:  ?Headache - No ?Fatigue - No ?Visual Disturbances - No ?Chest pain - No ?Dyspnea - No ?Palpitations - No ?LE edema - No ? ?2. Asthma ?Was not able to pick up inhaler. Was told the cost would be $145 by pharmacy and that the generic fluticasone would be $5 with her insurance. ? ?3. Muscle spasms ?Gazelle reports muscle spasms in her thighs, lower legs, and ribs. This has been ongoing for a few months now. She reports that she does stay well hydrated.  ?  ? ? ?ROS ?Negative unless specially indicated above in HPI. ?  ?Objective:  ?  ? ?BP 118/76 Comment: home reading  Pulse 98   Temp 98.6 ?F (37 ?C) (Oral)   Resp 18   Ht _0  (1.626 m)   Wt 184 lb 6.4 oz (83.6 kg)   SpO2 97%   BMI 31.65 kg/m?  ?BP Readings from Last 3 Encounters:  ?05/22/21 118/76  ?05/07/21 (!) 212/114  ?09/28/19 130/72  ? ?  ? ?Physical Exam ?Vitals and nursing note reviewed.  ?Constitutional:   ?   General: She is not in acute distress. ?   Appearance: She is not ill-appearing, toxic-appearing or diaphoretic.  ?HENT:  ?   Nose: Nose normal.  ?   Mouth/Throat:  ?   Mouth: Mucous membranes are moist.  ?   Pharynx: Oropharynx is clear.  ?Eyes:  ?   Extraocular Movements: Extraocular movements intact.  ?   Pupils: Pupils are equal, round, and reactive to light.  ?Neck:  ?   Vascular: No carotid bruit or JVD.  ?Cardiovascular:  ?   Rate and Rhythm: Normal rate and regular rhythm.  ?   Heart sounds: Normal heart sounds. No murmur  heard. ?Pulmonary:  ?   Effort: Pulmonary effort is normal. No respiratory distress.  ?   Breath sounds: Normal breath sounds. No wheezing, rhonchi or rales.  ?Chest:  ?   Chest wall: No tenderness.  ?Musculoskeletal:  ?   Right lower leg: No edema.  ?   Left lower leg: No edema.  ?Skin: ?   General: Skin is warm and dry.  ?Neurological:  ?   General: No focal deficit present.  ?   Mental Status: She is alert and oriented to person, place, and time.  ?Psychiatric:     ?   Mood and Affect: Mood normal.     ?   Behavior: Behavior normal.  ? ? ? ?No results found for any visits on 05/22/21. ? ?Last CBC ?Lab Results  ?Component Value Date  ? WBC 8.6 05/07/2021  ? HGB 14.6 05/07/2021  ? HCT 42.2 05/07/2021  ? MCV 90 05/07/2021  ? MCH 31.2 05/07/2021  ? RDW 12.9 05/07/2021  ? PLT 338 05/07/2021  ? ?Last metabolic panel ?Lab Results  ?Component Value Date  ? GLUCOSE 93 05/07/2021  ? NA 144 05/07/2021  ? K 4.6 05/07/2021  ? CL 104 05/07/2021  ?  CO2 24 05/07/2021  ? BUN 8 05/07/2021  ? CREATININE 0.62 05/07/2021  ? EGFR 104 05/07/2021  ? CALCIUM 9.7 05/07/2021  ? PROT 6.8 05/07/2021  ? ALBUMIN 4.4 05/07/2021  ? LABGLOB 2.4 05/07/2021  ? AGRATIO 1.8 05/07/2021  ? BILITOT 0.3 05/07/2021  ? ALKPHOS 114 05/07/2021  ? AST 19 05/07/2021  ? ALT 23 05/07/2021  ? ?Last lipids ?Lab Results  ?Component Value Date  ? CHOL 252 (H) 05/07/2021  ? HDL 52 05/07/2021  ? LDLCALC 164 (H) 05/07/2021  ? TRIG 195 (H) 05/07/2021  ? CHOLHDL 4.8 (H) 05/07/2021  ? ?Last hemoglobin A1c ?Lab Results  ?Component Value Date  ? HGBA1C 5.3 05/07/2021  ? ?  ? ?The 10-year ASCVD risk score (Arnett DK, et al., 2019) is: 22.8% ? ?  ?Assessment & Plan:  ? ?Tyyonna was seen today for blood pressure check. ? ?Diagnoses and all orders for this visit: ? ?Primary hypertension ?Well controlled on current regimen. Will recheck labs today.  ?-     Anemia Profile B ?-     BMP8+EGFR ? ?Mild persistent asthma without complication ?Will resend fluticasone Rx. Discussed to  notify office if this is not $5 as she was told it would be with her insurance.  ?-     fluticasone (FLOVENT HFA) 220 MCG/ACT inhaler; Inhale 1 puff into the lungs 2 (two) times daily. ? ?Muscle spasm ?Hydration, stretching. Will check labs as below.  ?-     Anemia Profile B ?-     BMP8+EGFR ? ?Colon cancer screening ?-     Ambulatory referral to Gastroenterology ? ?Need for vaccination ?Shingles vaccine today in office.  ? ? ?Return in about 2 months (around 08/06/2021) for chronic follow up.  ? ?The patient indicates understanding of these issues and agrees with the plan. ? ?Gwenlyn Perking, FNP ? ?

## 2021-05-23 LAB — ANEMIA PROFILE B
Basophils Absolute: 0.1 10*3/uL (ref 0.0–0.2)
Basos: 1 %
EOS (ABSOLUTE): 0.3 10*3/uL (ref 0.0–0.4)
Eos: 4 %
Ferritin: 241 ng/mL — ABNORMAL HIGH (ref 15–150)
Folate: 6.3 ng/mL (ref >3.0)
Hematocrit: 40.7 % (ref 34.0–46.6)
Hemoglobin: 14.4 g/dL (ref 11.1–15.9)
Immature Grans (Abs): 0 10*3/uL (ref 0.0–0.1)
Immature Granulocytes: 0 %
Iron Saturation: 33 % (ref 15–55)
Iron: 83 ug/dL (ref 27–159)
Lymphocytes Absolute: 2.3 10*3/uL (ref 0.7–3.1)
Lymphs: 27 %
MCH: 32.4 pg (ref 26.6–33.0)
MCHC: 35.4 g/dL (ref 31.5–35.7)
MCV: 92 fL (ref 79–97)
Monocytes Absolute: 0.7 10*3/uL (ref 0.1–0.9)
Monocytes: 8 %
Neutrophils Absolute: 5.1 10*3/uL (ref 1.4–7.0)
Neutrophils: 60 %
Platelets: 371 10*3/uL (ref 150–450)
RBC: 4.45 x10E6/uL (ref 3.77–5.28)
RDW: 12.9 % (ref 11.7–15.4)
Retic Ct Pct: 1.6 % (ref 0.6–2.6)
Total Iron Binding Capacity: 254 ug/dL (ref 250–450)
UIBC: 171 ug/dL (ref 131–425)
Vitamin B-12: 579 pg/mL (ref 232–1245)
WBC: 8.4 10*3/uL (ref 3.4–10.8)

## 2021-05-23 LAB — BMP8+EGFR
BUN/Creatinine Ratio: 21 (ref 9–23)
BUN: 14 mg/dL (ref 6–24)
CO2: 23 mmol/L (ref 20–29)
Calcium: 9.1 mg/dL (ref 8.7–10.2)
Chloride: 101 mmol/L (ref 96–106)
Creatinine, Ser: 0.67 mg/dL (ref 0.57–1.00)
Glucose: 92 mg/dL (ref 70–99)
Potassium: 4.2 mmol/L (ref 3.5–5.2)
Sodium: 143 mmol/L (ref 134–144)
eGFR: 103 mL/min/{1.73_m2} (ref >59)

## 2021-05-26 NOTE — Telephone Encounter (Signed)
Attempted to contact pt without a return call in over 3 days, will close encounter. 

## 2021-05-28 NOTE — Telephone Encounter (Signed)
Spoke with [facility] pharmacist and was told that pts insurance does not cover the Fluticasone Rx and says it will cost pt $145 to fill. ? ?Can we send a cheaper Rx? ?

## 2021-05-31 ENCOUNTER — Other Ambulatory Visit: Payer: Self-pay | Admitting: Family Medicine

## 2021-05-31 DIAGNOSIS — I10 Essential (primary) hypertension: Secondary | ICD-10-CM

## 2021-06-19 ENCOUNTER — Telehealth: Payer: Self-pay | Admitting: Family Medicine

## 2021-06-19 NOTE — Telephone Encounter (Signed)
Na acc

## 2021-06-19 NOTE — Telephone Encounter (Signed)
Pt aware we do not have this as samples we give.

## 2021-06-19 NOTE — Telephone Encounter (Signed)
Pt aware we do not have samples to give. Aware This is not something we typically give

## 2021-06-19 NOTE — Telephone Encounter (Signed)
Patient calling to check on samples.

## 2021-06-25 DIAGNOSIS — H5213 Myopia, bilateral: Secondary | ICD-10-CM | POA: Diagnosis not present

## 2021-06-25 DIAGNOSIS — H5203 Hypermetropia, bilateral: Secondary | ICD-10-CM | POA: Diagnosis not present

## 2021-06-27 ENCOUNTER — Encounter: Payer: PRIVATE HEALTH INSURANCE | Admitting: Family Medicine

## 2021-07-02 ENCOUNTER — Other Ambulatory Visit: Payer: Self-pay | Admitting: Family Medicine

## 2021-07-02 DIAGNOSIS — I10 Essential (primary) hypertension: Secondary | ICD-10-CM

## 2021-07-23 ENCOUNTER — Ambulatory Visit: Payer: PRIVATE HEALTH INSURANCE | Admitting: Family Medicine

## 2021-07-24 ENCOUNTER — Other Ambulatory Visit: Payer: Self-pay | Admitting: Family Medicine

## 2021-07-24 ENCOUNTER — Encounter: Payer: Self-pay | Admitting: Family Medicine

## 2021-07-24 DIAGNOSIS — I10 Essential (primary) hypertension: Secondary | ICD-10-CM

## 2021-08-04 ENCOUNTER — Ambulatory Visit: Payer: BC Managed Care – PPO | Admitting: Family Medicine

## 2021-08-04 ENCOUNTER — Encounter: Payer: Self-pay | Admitting: Family Medicine

## 2021-08-04 VITALS — BP 134/77 | HR 86 | Temp 97.6°F | Resp 20 | Ht 64.0 in | Wt 184.0 lb

## 2021-08-04 DIAGNOSIS — E6609 Other obesity due to excess calories: Secondary | ICD-10-CM

## 2021-08-04 DIAGNOSIS — Z6831 Body mass index (BMI) 31.0-31.9, adult: Secondary | ICD-10-CM | POA: Diagnosis not present

## 2021-08-04 DIAGNOSIS — J309 Allergic rhinitis, unspecified: Secondary | ICD-10-CM

## 2021-08-04 DIAGNOSIS — J4550 Severe persistent asthma, uncomplicated: Secondary | ICD-10-CM | POA: Diagnosis not present

## 2021-08-04 DIAGNOSIS — K219 Gastro-esophageal reflux disease without esophagitis: Secondary | ICD-10-CM

## 2021-08-04 DIAGNOSIS — E782 Mixed hyperlipidemia: Secondary | ICD-10-CM

## 2021-08-04 DIAGNOSIS — M62838 Other muscle spasm: Secondary | ICD-10-CM

## 2021-08-04 DIAGNOSIS — Z1211 Encounter for screening for malignant neoplasm of colon: Secondary | ICD-10-CM

## 2021-08-04 DIAGNOSIS — I10 Essential (primary) hypertension: Secondary | ICD-10-CM | POA: Diagnosis not present

## 2021-08-04 MED ORDER — TRELEGY ELLIPTA 100-62.5-25 MCG/ACT IN AEPB: 1.0000 | INHALATION_SPRAY | Freq: Every day | RESPIRATORY_TRACT | 11 refills | Status: DC

## 2021-08-04 MED ORDER — METHOCARBAMOL 500 MG PO TABS: 500.0000 mg | ORAL_TABLET | Freq: Three times a day (TID) | ORAL | 3 refills | Status: DC | PRN

## 2021-08-04 NOTE — Patient Instructions (Signed)
Muscle Cramps and Spasms Muscle cramps and spasms occur when a muscle or muscles tighten and you have no control over this tightening (involuntary muscle contraction). They are a common problem and can develop in any muscle. The most common place is in the calf muscles of the leg. Muscle cramps and muscle spasms are both involuntary muscle contractions, but there are some differences between the two: Muscle cramps are painful. They come and go and may last for a few seconds or up to 15 minutes. Muscle cramps are often more forceful and last longer than muscle spasms. Muscle spasms may or may not be painful. They may also last just a few seconds or much longer. Certain medical conditions, such as diabetes or Parkinson's disease, can make it more likely to develop cramps or spasms. However, cramps or spasms are usually not caused by a serious underlying problem. Common causes include: Doing more physical work or exercise than your body is ready for (overexertion). Overuse from repeating certain movements too many times. Remaining in a certain position for a long period of time. Improper preparation, form, or technique while playing a sport or doing an activity. Dehydration. Injury. Side effects of some medicines. Abnormally low levels of the salts and minerals in your blood (electrolytes), especially potassium and calcium. This could happen if you are taking water pills (diuretics) or if you are pregnant. In many cases, the cause of muscle cramps or spasms is not known. Follow these instructions at home: Managing pain and stiffness     Try massaging, stretching, and relaxing the affected muscle. Do this for several minutes at a time. If directed, apply heat to tight or tense muscles as often as told by your health care provider. Use the heat source that your health care provider recommends, such as a moist heat pack or a heating pad. Place a towel between your skin and the heat source. Leave the  heat on for 20-30 minutes. Remove the heat if your skin turns bright red. This is especially important if you are unable to feel pain, heat, or cold. You may have a greater risk of getting burned. If directed, put ice on the affected area. This may help if you are sore or have pain after a cramp or spasm. Put ice in a plastic bag. Place a towel between your skin and the bag. Leave the ice on for 20 minutes, 2-3 times a day. Try taking hot showers or baths to help relax tight muscles. Eating and drinking Drink enough fluid to keep your urine pale yellow. Staying well hydrated may help prevent cramps or spasms. Eat a healthy diet that includes plenty of nutrients to help your muscles function. A healthy diet includes fruits and vegetables, lean protein, whole grains, and low-fat or nonfat dairy products. General instructions If you are having frequent cramps, avoid intense exercise for several days. Take over-the-counter and prescription medicines only as told by your health care provider. Pay attention to any changes in your symptoms. Keep all follow-up visits as told by your health care provider. This is important. Contact a health care provider if: Your cramps or spasms get more severe or happen more often. Your cramps or spasms do not improve over time. Summary Muscle cramps and spasms occur when a muscle or muscles tighten and you have no control over this tightening (involuntary muscle contraction). The most common place for cramps or spasms to occur is in the calf muscles of the leg. Massaging, stretching, and relaxing the affected   muscle may relieve the cramp or spasm. Drink enough fluid to keep your urine pale yellow. Staying well hydrated may help prevent cramps or spasms. This information is not intended to replace advice given to you by your health care provider. Make sure you discuss any questions you have with your health care provider. Document Revised: 07/26/2020 Document  Reviewed: 07/26/2020 Elsevier Patient Education  2023 Elsevier Inc.  

## 2021-08-04 NOTE — Progress Notes (Signed)
Established Patient Office Visit  Subjective   Patient ID: Haley Nielsen, female    DOB: 1965/02/04  Age: 56 y.o. MRN: [id number]  Chief Complaint  Patient presents with   Medical Management of Chronic Issues    HPI 1.HTN Complaint with meds - yes for the most part- she has missed her dose today.  Current Medications - lisinopril HCTZ Pertinent ROS:  Headache - No Fatigue - No Visual Disturbances - No Chest pain - No Dyspnea - No Palpitations - No LE edema - No  2. Asthma She has been using flovent daily. She has felt improvement with her symptoms but continues to have some wheezing and shortness of breath daily. She uses her albuterol inhaler BID currenlty.   3.  Muscle spasms She would like to try a muscle relaxer for her muscle spasms. They occurs in her sides and legs. They occur daily, more likely at night but also depending on her activity at work. She has been staying well hydrated. She has not been stretching.   4. GERD Compliant with medications - Yes Current medications - prilosec Sore throat - No Voice change - No Hemoptysis - No Dysphagia or dyspepsia - No Water brash - No Red Flags (weight loss, hematochezia, melena, weight loss, early satiety, fevers, odynophagia, or persistent vomiting) - No   Past Medical History:  Diagnosis Date   Allergic rhinitis    Allergy    Blurry vision    Bronchospasm    Chest pain    Dizziness    Elevated CK-MB level    GERD (gastroesophageal reflux disease)    Hyperlipidemia    Hypertension    Neuropathy 05/15/2021   Syncope    Syncope    Tobacco abuse    Weakness       ROS Negative unless specially indicated above in HPI.   Objective:     BP 134/77   Pulse 86   Temp 97.6 F (36.4 C) (Oral)   Resp 20   Ht _0  (1.626 m)   Wt 184 lb (83.5 kg)   SpO2 98%   BMI 31.58 kg/m  BP Readings from Last 3 Encounters:  08/04/21 134/77  05/22/21 118/76  05/07/21 (!) 212/114     Physical Exam Vitals and  nursing note reviewed.  Constitutional:      General: She is not in acute distress.    Appearance: She is not ill-appearing, toxic-appearing or diaphoretic.  HENT:     Head: Normocephalic and atraumatic.  Eyes:     Extraocular Movements: Extraocular movements intact.     Conjunctiva/sclera: Conjunctivae normal.     Pupils: Pupils are equal, round, and reactive to light.  Cardiovascular:     Rate and Rhythm: Normal rate and regular rhythm.     Heart sounds: Normal heart sounds. No murmur heard. Pulmonary:     Effort: Pulmonary effort is normal. No respiratory distress.     Breath sounds: Normal breath sounds. No wheezing or rales.  Chest:     Chest wall: No tenderness.  Abdominal:     General: Bowel sounds are normal. There is no distension.     Palpations: Abdomen is soft.     Tenderness: There is no abdominal tenderness. There is no guarding or rebound.  Musculoskeletal:        General: No swelling or tenderness. Normal range of motion.     Right lower leg: No edema.     Left lower leg: No edema.  Skin:  General: Skin is warm and dry.     Coloration: Skin is not jaundiced.     Findings: No erythema or rash.  Neurological:     General: No focal deficit present.     Mental Status: She is alert and oriented to person, place, and time.     Motor: No weakness.     Gait: Gait normal.  Psychiatric:        Mood and Affect: Mood normal.        Behavior: Behavior normal.      No results found for any visits on 08/04/21.  Last CBC Lab Results  Component Value Date   WBC 9.7 08/04/2021   HGB 12.7 08/04/2021   HCT 37.2 08/04/2021   MCV 94 08/04/2021   MCH 31.9 08/04/2021   RDW 12.9 08/04/2021   PLT 335 12/75/1700   Last metabolic panel Lab Results  Component Value Date   GLUCOSE 96 08/04/2021   NA 146 (H) 08/04/2021   K 4.2 08/04/2021   CL 107 (H) 08/04/2021   CO2 25 08/04/2021   BUN 14 08/04/2021   CREATININE 0.68 08/04/2021   EGFR 102 08/04/2021   CALCIUM 9.7  08/04/2021   PROT 6.8 08/04/2021   ALBUMIN 4.3 08/04/2021   LABGLOB 2.5 08/04/2021   AGRATIO 1.7 08/04/2021   BILITOT 0.2 08/04/2021   ALKPHOS 102 08/04/2021   AST 19 08/04/2021   ALT 22 08/04/2021   Last lipids Lab Results  Component Value Date   CHOL 187 08/04/2021   HDL 46 08/04/2021   LDLCALC 100 (H) 08/04/2021   TRIG 239 (H) 08/04/2021   CHOLHDL 4.1 08/04/2021      The 10-year ASCVD risk score (Arnett DK, et al., 2019) is: 15.2%    Assessment & Plan:   Jalisia was seen today for medical management of chronic issues.  Diagnoses and all orders for this visit:  Primary hypertension Well controlled on current regimen.  -     CBC with Differential/Platelet -     CMP14+EGFR -     Lipid panel  Severe persistent asthma without complication Uncontrolled. Switch from flovent to trelegy.  -     Fluticasone-Umeclidin-Vilant (TRELEGY ELLIPTA) 100-62.5-25 MCG/ACT AEPB; Inhale 1 puff into the lungs daily.  Mixed hyperlipidemia On statin. Labs pending. -     CBC with Differential/Platelet -     CMP14+EGFR -     Lipid panel  Class 1 obesity due to excess calories with serious comorbidity and body mass index (BMI) of 31.0 to 31.9 in adult Diet and exercise. Labs pending.  -     CBC with Differential/Platelet -     CMP14+EGFR -     Lipid panel  Gastroesophageal reflux disease, unspecified whether esophagitis present Well controlled on current regimen. On prilosec -     CBC with Differential/Platelet -     CMP14+EGFR  Muscle spasm Discussed hydration, stretching. Try robaxin as below.  -     methocarbamol (ROBAXIN) 500 MG tablet; Take 1 tablet (500 mg total) by mouth every 8 (eight) hours as needed for muscle spasms.  Colon cancer screening -     Ambulatory referral to Gastroenterology   Return in about 3 months (around 11/04/2021) for chronic follow up.  The patient indicates understanding of these issues and agrees with the plan.   Gwenlyn Perking, FNP

## 2021-08-05 LAB — CBC WITH DIFFERENTIAL/PLATELET
Basophils Absolute: 0.1 10*3/uL (ref 0.0–0.2)
Basos: 1 %
EOS (ABSOLUTE): 0.4 10*3/uL (ref 0.0–0.4)
Eos: 4 %
Hematocrit: 37.2 % (ref 34.0–46.6)
Hemoglobin: 12.7 g/dL (ref 11.1–15.9)
Immature Grans (Abs): 0 10*3/uL (ref 0.0–0.1)
Immature Granulocytes: 0 %
Lymphocytes Absolute: 2.7 10*3/uL (ref 0.7–3.1)
Lymphs: 28 %
MCH: 31.9 pg (ref 26.6–33.0)
MCHC: 34.1 g/dL (ref 31.5–35.7)
MCV: 94 fL (ref 79–97)
Monocytes Absolute: 0.6 10*3/uL (ref 0.1–0.9)
Monocytes: 7 %
Neutrophils Absolute: 5.9 10*3/uL (ref 1.4–7.0)
Neutrophils: 60 %
Platelets: 335 10*3/uL (ref 150–450)
RBC: 3.98 x10E6/uL (ref 3.77–5.28)
RDW: 12.9 % (ref 11.7–15.4)
WBC: 9.7 10*3/uL (ref 3.4–10.8)

## 2021-08-05 LAB — CMP14+EGFR
ALT: 22 IU/L (ref 0–32)
AST: 19 IU/L (ref 0–40)
Albumin/Globulin Ratio: 1.7 (ref 1.2–2.2)
Albumin: 4.3 g/dL (ref 3.8–4.9)
Alkaline Phosphatase: 102 IU/L (ref 44–121)
BUN/Creatinine Ratio: 21 (ref 9–23)
BUN: 14 mg/dL (ref 6–24)
Bilirubin Total: 0.2 mg/dL (ref 0.0–1.2)
CO2: 25 mmol/L (ref 20–29)
Calcium: 9.7 mg/dL (ref 8.7–10.2)
Chloride: 107 mmol/L — ABNORMAL HIGH (ref 96–106)
Creatinine, Ser: 0.68 mg/dL (ref 0.57–1.00)
Globulin, Total: 2.5 g/dL (ref 1.5–4.5)
Glucose: 96 mg/dL (ref 70–99)
Potassium: 4.2 mmol/L (ref 3.5–5.2)
Sodium: 146 mmol/L — ABNORMAL HIGH (ref 134–144)
Total Protein: 6.8 g/dL (ref 6.0–8.5)
eGFR: 102 mL/min/{1.73_m2} (ref >59)

## 2021-08-05 LAB — LIPID PANEL
Chol/HDL Ratio: 4.1 ratio (ref 0.0–4.4)
Cholesterol, Total: 187 mg/dL (ref 100–199)
HDL: 46 mg/dL (ref >39)
LDL Chol Calc (NIH): 100 mg/dL — ABNORMAL HIGH (ref 0–99)
Triglycerides: 239 mg/dL — ABNORMAL HIGH (ref 0–149)
VLDL Cholesterol Cal: 41 mg/dL — ABNORMAL HIGH (ref 5–40)

## 2021-08-06 ENCOUNTER — Other Ambulatory Visit: Payer: Self-pay | Admitting: Family Medicine

## 2021-08-06 DIAGNOSIS — I700 Atherosclerosis of aorta: Secondary | ICD-10-CM

## 2021-08-06 DIAGNOSIS — E782 Mixed hyperlipidemia: Secondary | ICD-10-CM

## 2021-08-06 MED ORDER — ATORVASTATIN CALCIUM 80 MG PO TABS: 40.0000 mg | ORAL_TABLET | Freq: Every day | ORAL | 3 refills | Status: DC

## 2021-08-07 ENCOUNTER — Encounter: Payer: Self-pay | Admitting: Family Medicine

## 2021-08-07 DIAGNOSIS — K219 Gastro-esophageal reflux disease without esophagitis: Secondary | ICD-10-CM | POA: Insufficient documentation

## 2021-08-07 DIAGNOSIS — E782 Mixed hyperlipidemia: Secondary | ICD-10-CM | POA: Insufficient documentation

## 2021-08-07 DIAGNOSIS — E6609 Other obesity due to excess calories: Secondary | ICD-10-CM | POA: Insufficient documentation

## 2021-08-15 ENCOUNTER — Telehealth: Payer: Self-pay | Admitting: Family Medicine

## 2021-08-15 DIAGNOSIS — J4530 Mild persistent asthma, uncomplicated: Secondary | ICD-10-CM

## 2021-08-18 MED ORDER — ALBUTEROL SULFATE HFA 108 (90 BASE) MCG/ACT IN AERS: 1.0000 | INHALATION_SPRAY | RESPIRATORY_TRACT | 2 refills | Status: DC | PRN

## 2021-08-18 NOTE — Telephone Encounter (Signed)
Pt aware Albuterol RF sent to [facility] in chart per 08/04/21 OV note, changed to Trelegy

## 2021-09-02 ENCOUNTER — Other Ambulatory Visit: Payer: Self-pay | Admitting: Family Medicine

## 2021-09-02 DIAGNOSIS — K219 Gastro-esophageal reflux disease without esophagitis: Secondary | ICD-10-CM

## 2021-09-02 DIAGNOSIS — J309 Allergic rhinitis, unspecified: Secondary | ICD-10-CM

## 2021-10-13 ENCOUNTER — Encounter: Payer: Self-pay | Admitting: Gastroenterology

## 2021-11-05 ENCOUNTER — Ambulatory Visit: Payer: BC Managed Care – PPO | Admitting: Family Medicine

## 2021-11-05 ENCOUNTER — Telehealth: Payer: Self-pay | Admitting: *Deleted

## 2021-11-05 ENCOUNTER — Encounter: Payer: Self-pay | Admitting: Family Medicine

## 2021-11-05 VITALS — BP 113/67 | HR 92 | Temp 97.0°F | Ht 64.0 in | Wt 181.0 lb

## 2021-11-05 DIAGNOSIS — J4551 Severe persistent asthma with (acute) exacerbation: Secondary | ICD-10-CM | POA: Diagnosis not present

## 2021-11-05 DIAGNOSIS — Z124 Encounter for screening for malignant neoplasm of cervix: Secondary | ICD-10-CM

## 2021-11-05 DIAGNOSIS — E782 Mixed hyperlipidemia: Secondary | ICD-10-CM | POA: Diagnosis not present

## 2021-11-05 DIAGNOSIS — M62838 Other muscle spasm: Secondary | ICD-10-CM

## 2021-11-05 DIAGNOSIS — J4550 Severe persistent asthma, uncomplicated: Secondary | ICD-10-CM | POA: Insufficient documentation

## 2021-11-05 DIAGNOSIS — I700 Atherosclerosis of aorta: Secondary | ICD-10-CM | POA: Diagnosis not present

## 2021-11-05 DIAGNOSIS — M546 Pain in thoracic spine: Secondary | ICD-10-CM

## 2021-11-05 DIAGNOSIS — E6609 Other obesity due to excess calories: Secondary | ICD-10-CM

## 2021-11-05 DIAGNOSIS — I10 Essential (primary) hypertension: Secondary | ICD-10-CM | POA: Diagnosis not present

## 2021-11-05 DIAGNOSIS — Z6831 Body mass index (BMI) 31.0-31.9, adult: Secondary | ICD-10-CM

## 2021-11-05 MED ORDER — LIDOCAINE 5 % EX PTCH: 1.0000 | MEDICATED_PATCH | CUTANEOUS | 0 refills | Status: AC

## 2021-11-05 MED ORDER — PREDNISONE 20 MG PO TABS: 40.0000 mg | ORAL_TABLET | Freq: Every day | ORAL | 0 refills | Status: AC

## 2021-11-05 MED ORDER — METHOCARBAMOL 500 MG PO TABS: 500.0000 mg | ORAL_TABLET | Freq: Three times a day (TID) | ORAL | 0 refills | Status: DC | PRN

## 2021-11-05 MED ORDER — MELOXICAM 15 MG PO TABS: 15.0000 mg | ORAL_TABLET | Freq: Every day | ORAL | 0 refills | Status: AC

## 2021-11-05 MED ORDER — ATORVASTATIN CALCIUM 80 MG PO TABS: 80.0000 mg | ORAL_TABLET | Freq: Every day | ORAL | 3 refills | Status: DC

## 2021-11-05 NOTE — Patient Instructions (Signed)
Asthma, Adult ? ?Asthma is a long-term (chronic) condition that causes recurrent episodes in which the lower airways in the lungs become tight and narrow. The narrowing is caused by inflammation and tightening of the smooth muscle around the lower airways. ?Asthma episodes, also called asthma attacks or asthma flares, may cause coughing, making high-pitched whistling sounds when you breathe, most often when you breathe out (wheezing), shortness of breath, and chest pain. The airways may produce extra mucus caused by the inflammation and irritation. During an attack, it can be difficult to breathe. Asthma attacks can range from minor to life-threatening. ?Asthma cannot be cured, but medicines and lifestyle changes can help control it and treat acute attacks. It is important to keep your asthma well controlled so the condition does not interfere with your daily life. ?What are the causes? ?This condition is believed to be caused by inherited (genetic) and environmental factors, but its exact cause is not known. ?What can trigger an asthma attack? ?Many things can bring on an asthma attack or make symptoms worse. These triggers are different for every person. Common triggers include: ?Allergens and irritants like mold, dust, pet dander, cockroaches, pollen, air pollution, and chemical odors. ?Cigarette smoke. ?Weather changes and cold air. ?Stress and strong emotional responses such as crying or laughing hard. ?Certain medications such as aspirin or beta blockers. ?Infections and inflammatory conditions, such as the flu, a cold, pneumonia, or inflammation of the nasal membranes (rhinitis). ?Gastroesophageal reflux disease (GERD). ?What are the signs or symptoms? ?Symptoms may occur right after exposure to an asthma trigger or hours later and can vary by person. Common signs and symptoms include: ?Wheezing. ?Trouble breathing (shortness of breath). ?Excessive nighttime or early morning coughing. ?Chest  tightness. ?Tiredness (fatigue) with minimal activity. ?Difficulty talking in complete sentences. ?Poor exercise tolerance. ?How is this diagnosed? ?This condition is diagnosed based on: ?A physical exam and your medical history. ?Tests, which may include: ?Lung function studies to evaluate the flow of air in your lungs. ?Allergy tests. ?Imaging tests, such as X-rays. ?How is this treated? ?There is no cure, but symptoms can be controlled with proper treatment. Treatment usually involves: ?Identifying and avoiding your asthma triggers. ?Inhaled medicines. Two types are commonly used to treat asthma, depending on severity: ?Controller medicines. These help prevent asthma symptoms from occurring. They are taken every day. ?Fast-acting reliever or rescue medicines. These quickly relieve asthma symptoms. They are used as needed and provide short-term relief. ?Using other medicines, such as: ?Allergy medicines, such as antihistamines, if your asthma attacks are triggered by allergens. ?Immune medicines (immunomodulators). These are medicines that help control the immune system. ?Using supplemental oxygen. This is only needed during a severe episode. ?Creating an asthma action plan. An asthma action plan is a written plan for managing and treating your asthma attacks. This plan includes: ?A list of your asthma triggers and how to avoid them. ?Information about when medicines should be taken and when their dosage should be changed. ?Instructions about using a device called a peak flow meter. A peak flow meter measures how well the lungs are working and the severity of your asthma. It helps you monitor your condition. ?Follow these instructions at home: ?Take over-the-counter and prescription medicines only as told by your health care provider. ?Stay up to date on all vaccinations as recommended by your healthcare provider, including vaccines for the flu and pneumonia. ?Use a peak flow meter and keep track of your peak flow  readings. ?Understand and use your asthma   action plan to address any asthma flares. ?Do not smoke or allow anyone to smoke in your home. ?Contact a health care provider if: ?You have wheezing, shortness of breath, or a cough that is not responding to medicines. ?Your medicines are causing side effects, such as a rash, itching, swelling, or trouble breathing. ?You need to use a reliever medicine more than 2-3 times a week. ?Your peak flow reading is still at 50-79% of your personal best after following your action plan for 1 hour. ?You have a fever and shortness of breath. ?Get help right away if: ?You are getting worse and do not respond to treatment during an asthma attack. ?You are short of breath when at rest or when doing very little physical activity. ?You have difficulty eating, drinking, or talking. ?You have chest pain or tightness. ?You develop a fast heartbeat or palpitations. ?You have a bluish color to your lips or fingernails. ?You are light-headed or dizzy, or you faint. ?Your peak flow reading is less than 50% of your personal best. ?You feel too tired to breathe normally. ?These symptoms may be an emergency. Get help right away. Call 911. ?Do not wait to see if the symptoms will go away. ?Do not drive yourself to the hospital. ?Summary ?Asthma is a long-term (chronic) condition that causes recurrent episodes in which the airways become tight and narrow. Asthma episodes, also called asthma attacks or asthma flares, can cause coughing, wheezing, shortness of breath, and chest pain. ?Asthma cannot be cured, but medicines and lifestyle changes can help keep it well controlled and prevent asthma flares. ?Make sure you understand how to avoid triggers and how and when to use your medicines. ?Asthma attacks can range from minor to life-threatening. Get help right away if you have an asthma attack and do not respond to treatment with your usual rescue medicines. ?This information is not intended to replace  advice given to you by your health care provider. Make sure you discuss any questions you have with your health care provider. ?Document Revised: 10/23/2020 Document Reviewed: 10/14/2020 ?Elsevier Patient Education ? 2023 Elsevier Inc. ? ?

## 2021-11-05 NOTE — Telephone Encounter (Signed)
Lidocaine 5% patches  Status: PA Response - Approved  Effective from 11/05/2021 through 11/04/2022.

## 2021-11-05 NOTE — Progress Notes (Signed)
Established Patient Office Visit  Subjective   Patient ID: Haley Nielsen, female    DOB: 1965-04-10  Age: 56 y.o. MRN: [id number]  Chief Complaint  Patient presents with   Medical Management of Chronic Issues   Hypertension    HPI 1.HTN Complaint with meds - yes for the most part- she has missed her dose today.  Current Medications - lisinopril HCTZ Pertinent ROS:  Headache - No Fatigue - No Visual Disturbances - No Chest pain - No Dyspnea - No Palpitations - No LE edema - No  2. Asthma She has been using trelegy daily. She reports increased cough and wheezing for the last 1-2 weeks. She has had to start using her albuterol inhaler 4x a day for the last few days. Denies fever, chest pain. Reports shortness of breath intermittently at work with increased activity. She typically uses her albuterol BID.    3. GERD Compliant with medications - Yes Current medications - prilosec Sore throat - No Voice change - No Hemoptysis - No Dysphagia or dyspepsia - No Water brash - No Red Flags (weight loss, hematochezia, melena, weight loss, early satiety, fevers, odynophagia, or persistent vomiting) - No  4. Pain on side Reports a constant burning pain for 2 weeks right along her bra line on the left side. It is very sensitive to the touch. She denies any rash. She has not tried any remedies. Denies injury. She denies aggravating or relieving factors. She does do repetitive movements at work. She denies nausea, vomiting, or abdominal pain. She continues to take robaxin prn for muscle spasms with improvement.   Past Medical History:  Diagnosis Date   Allergic rhinitis    Allergy    Blurry vision    Bronchospasm    Chest pain    Dizziness    Elevated CK-MB level    GERD (gastroesophageal reflux disease)    Hyperlipidemia    Hypertension    Neuropathy 05/15/2021   Syncope    Syncope    Tobacco abuse    Weakness       ROS Negative unless specially indicated above in HPI.    Objective:     BP 113/67   Pulse 92   Temp (!) 97 F (36.1 C) (Temporal)   Ht 5\' 4"  (1.626 m)   Wt 181 lb (82.1 kg)   SpO2 95%   BMI 31.07 kg/m  BP Readings from Last 3 Encounters:  11/05/21 113/67  08/04/21 134/77  05/22/21 118/76   Wt Readings from Last 3 Encounters:  11/05/21 181 lb (82.1 kg)  08/04/21 184 lb (83.5 kg)  05/22/21 184 lb 6.4 oz (83.6 kg)    Physical Exam Vitals and nursing note reviewed.  Constitutional:      General: She is not in acute distress.    Appearance: She is not ill-appearing, toxic-appearing or diaphoretic.  HENT:     Head: Normocephalic and atraumatic.  Eyes:     Extraocular Movements: Extraocular movements intact.     Conjunctiva/sclera: Conjunctivae normal.     Pupils: Pupils are equal, round, and reactive to light.  Cardiovascular:     Rate and Rhythm: Normal rate and regular rhythm.     Heart sounds: Normal heart sounds. No murmur heard. Pulmonary:     Effort: Pulmonary effort is normal. No tachypnea or respiratory distress.     Breath sounds: No wheezing, rhonchi or rales.  Chest:     Chest wall: No tenderness.  Abdominal:     General:  Bowel sounds are normal. There is no distension.     Palpations: Abdomen is soft.     Tenderness: There is no abdominal tenderness. There is no guarding or rebound.  Musculoskeletal:        General: No swelling.     Right lower leg: No edema.     Left lower leg: No edema.     Comments: Tenderness along T5 dermatone of left side. No rash, erythema, lesions, or swelling. Normal ROM. No bony tenderness.  Skin:    General: Skin is warm and dry.     Coloration: Skin is not jaundiced.     Findings: No erythema or rash.  Neurological:     General: No focal deficit present.     Mental Status: She is alert and oriented to person, place, and time.     Motor: No weakness.     Gait: Gait normal.  Psychiatric:        Mood and Affect: Mood normal.        Behavior: Behavior normal.      No  results found for any visits on 11/05/21.  Last CBC Lab Results  Component Value Date   WBC 9.7 08/04/2021   HGB 12.7 08/04/2021   HCT 37.2 08/04/2021   MCV 94 08/04/2021   MCH 31.9 08/04/2021   RDW 12.9 08/04/2021   PLT 335 09/01/4816   Last metabolic panel Lab Results  Component Value Date   GLUCOSE 96 08/04/2021   NA 146 (H) 08/04/2021   K 4.2 08/04/2021   CL 107 (H) 08/04/2021   CO2 25 08/04/2021   BUN 14 08/04/2021   CREATININE 0.68 08/04/2021   EGFR 102 08/04/2021   CALCIUM 9.7 08/04/2021   PROT 6.8 08/04/2021   ALBUMIN 4.3 08/04/2021   LABGLOB 2.5 08/04/2021   AGRATIO 1.7 08/04/2021   BILITOT 0.2 08/04/2021   ALKPHOS 102 08/04/2021   AST 19 08/04/2021   ALT 22 08/04/2021   Last lipids Lab Results  Component Value Date   CHOL 187 08/04/2021   HDL 46 08/04/2021   LDLCALC 100 (H) 08/04/2021   TRIG 239 (H) 08/04/2021   CHOLHDL 4.1 08/04/2021      The 10-year ASCVD risk score (Arnett DK, et al., 2019) is: 7.5%    Assessment & Plan:   [name] was seen today for medical management of chronic issues and hypertension.  Diagnoses and all orders for this visit:  Primary hypertension Well controlled on current regimen. Continue HCTZ-lisinopril. Will recheck labs at next visit.   Mixed hyperlipidemia Last LDL at goal. Unfortunately prescription was sent in incorrectly last time so dosage was not increased. Will now increase to 80 mg daily and will recheck at next visit.  -     atorvastatin (LIPITOR) 80 MG tablet; Take 1 tablet (80 mg total) by mouth daily. Keep on file  Aortic atherosclerosis ([place]) On lipitor and aspirin.  -     atorvastatin (LIPITOR) 80 MG tablet; Take 1 tablet (80 mg total) by mouth daily. Keep on file  Severe persistent asthma with acute exacerbation Uncontrolled in general with trelegy. Will treat for exacerbation now as she has had increased symptoms for 1-2 weeks. Referral to pulmonology placed.  -     predniSONE (DELTASONE) 20 MG  tablet; Take 2 tablets (40 mg total) by mouth daily with breakfast for 5 days. -     Ambulatory referral to Pulmonology  Class 1 obesity due to excess calories with serious comorbidity and body mass  index (BMI) of 31.0 to 31.9 in adult Down 3 lbs since last visit. Diet and exercise as tolerated.  Cervical cancer screening -     Ambulatory referral to Gynecology  Acute left-sided thoracic back pain ? Neuropathy vs shingles without rash vs muscle strain. Symptoms x 2 week. Will try prednisone burst, lidocaine patch and mobic as below. Do not take other NSAIDs with mobic.  -     predniSONE (DELTASONE) 20 MG tablet; Take 2 tablets (40 mg total) by mouth daily with breakfast for 5 days. -     meloxicam (MOBIC) 15 MG tablet; Take 1 tablet (15 mg total) by mouth daily for 14 days. -     lidocaine (LIDODERM) 5 %; Place 1 patch onto the skin daily. Remove & Discard patch within 12 hours or as directed by MD  Muscle spasm Well controlled on current regimen.  -     methocarbamol (ROBAXIN) 500 MG tablet; Take 1 tablet (500 mg total) by mouth every 8 (eight) hours as needed for muscle spasms.  Return in about 3 months (around 02/05/2022) for CPE. Sooner for new or worsening symptoms.   The patient indicates understanding of these issues and agrees with the plan.   Haley Perking, FNP

## 2021-11-07 ENCOUNTER — Ambulatory Visit (AMBULATORY_SURGERY_CENTER): Payer: Self-pay

## 2021-11-07 VITALS — Ht 64.0 in | Wt 180.0 lb

## 2021-11-07 DIAGNOSIS — Z1211 Encounter for screening for malignant neoplasm of colon: Secondary | ICD-10-CM

## 2021-11-07 NOTE — Progress Notes (Signed)

## 2021-11-29 ENCOUNTER — Other Ambulatory Visit: Payer: Self-pay | Admitting: Family Medicine

## 2021-11-29 DIAGNOSIS — M546 Pain in thoracic spine: Secondary | ICD-10-CM

## 2021-11-30 ENCOUNTER — Encounter: Payer: Self-pay | Admitting: Certified Registered Nurse Anesthetist

## 2021-11-30 NOTE — Progress Notes (Unsigned)
Haley Nielsen, female    DOB: 1965/05/27   MRN: [id number]   Brief patient profile:  56 yobf  active smoker with smoke exp as child and problems with rhinitis/cough worse with pollen  did not use inhalers in HS but has ever since as adult referred to pulmonary clinic 12/01/2021 by Marjorie Smolder  for ? Copd         History of Present Illness  12/01/2021  Pulmonary/ 1st office eval/Martavion Couper  on ACEi and trelegy  Chief Complaint  Patient presents with   Consult    Persistent wheeze, cough and dyspnea.  Increased use of albuterol x 2 months  Dyspnea:  no problem walking on job, nl pace, mb is 150 ft slt uphill  with freq  stopping x 1 no better on trelegy /  never pre treats  Cough: non productive/ pattern varies but worse at work Haley Nielsen with globus sensation  Sleep: on 3 pillows flat bed SABA use: not using over weekend when not working    No obvious day to day or daytime pattern/variability or assoc excess/ purulent sputum or mucus plugs or hemoptysis or cp or chest tightness,   or overt sinus or hb symptoms.   Sleeping as above  without nocturnal  or early am exacerbation  of respiratory  c/o's or need for noct saba. Also denies any obvious fluctuation of symptoms with weather or environmental changes or other aggravating or alleviating factors except as outlined above   No unusual exposure hx or h/o childhood pna or knowledge of premature birth.  Current Allergies, Complete Past Medical History, Past Surgical History, Family History, and Social History were reviewed in [employer] record.  ROS  The following are not active complaints unless bolded Hoarseness, sore throat, dysphagia, dental problems, itching, sneezing,  nasal congestion or discharge of excess mucus or purulent secretions, ear ache,   fever, chills, sweats, unintended wt loss or wt gain, classically pleuritic or exertional cp,  orthopnea pnd or arm/hand swelling  or leg swelling, presyncope,  palpitations, abdominal pain, anorexia, nausea, vomiting, diarrhea  or change in bowel habits or change in bladder habits, change in stools or change in urine, dysuria, hematuria,  rash, arthralgias, visual complaints, headache, numbness, weakness or ataxia or problems with walking or coordination,  change in mood or  memory.           Past Medical History:  Diagnosis Date   Allergic rhinitis    Allergy    Blurry vision    Bronchospasm    Chest pain    Dizziness    Elevated CK-MB level    GERD (gastroesophageal reflux disease)    Hyperlipidemia    Hypertension    Neuropathy 05/15/2021   Syncope    Syncope    Tobacco abuse    Weakness     Outpatient Medications Prior to Visit  Medication Sig Dispense Refill   albuterol (VENTOLIN HFA) 108 (90 Base) MCG/ACT inhaler Inhale 1-2 puffs into the lungs every 4 (four) hours as needed for wheezing or shortness of breath. 18 g 2   aspirin EC 81 MG tablet Take 1 tablet (81 mg total) by mouth daily. Swallow whole. 90 tablet 3   atorvastatin (LIPITOR) 80 MG tablet Take 1 tablet (80 mg total) by mouth daily. Keep on file 90 tablet 3   fluticasone (FLONASE) 50 MCG/ACT nasal spray Place 2 sprays into both nostrils daily. 16 g 6   Fluticasone-Umeclidin-Vilant (TRELEGY ELLIPTA) 100-62.5-25 MCG/ACT AEPB Inhale  1 puff into the lungs daily. 1 each 11   lidocaine (LIDODERM) 5 % Place 1 patch onto the skin daily. Remove & Discard patch within 12 hours or as directed by MD 30 patch 0   lisinopril-hydrochlorothiazide (ZESTORETIC) 20-25 MG tablet Take 1 tablet by mouth once daily 90 tablet 1   loratadine (CLARITIN) 10 MG tablet Take 1 tablet by mouth once daily 90 tablet 1   methocarbamol (ROBAXIN) 500 MG tablet Take 1 tablet (500 mg total) by mouth every 8 (eight) hours as needed for muscle spasms. 180 tablet 0   Omega-3 Fatty Acids (FISH OIL) 1000 MG CAPS Take by mouth.     omeprazole (PRILOSEC) 20 MG capsule Take 1 capsule by mouth once daily 90 capsule 1    No facility-administered medications prior to visit.     Objective:     BP 126/64 (BP Location: Left Arm, Patient Position: Sitting, Cuff Size: Normal)   Pulse 77   Temp 97.9 F (36.6 C) (Oral)   Ht '5\' 4"'$  (1.626 m)   Wt 184 lb 6.4 oz (83.6 kg)   SpO2 96%   BMI 31.65 kg/m   SpO2: 96 %  Hoarse amb wf nad    HEENT : Oropharynx  clear     Nasal turbinates nl    NECK :  without  apparent JVD/ palpable Nodes/TM    LUNGS: no acc muscle use,  Nl contour chest which is clear to A and P bilaterally without cough on insp or exp maneuvers   CV:  RRR  no s3 or murmur or increase in P2, and no edema   ABD:  soft and nontender with nl inspiratory excursion in the supine position. No bruits or organomegaly appreciated   MS:  Nl gait/ ext warm without deformities Or obvious joint restrictions  calf tenderness, cyanosis or clubbing    SKIN: warm and dry without lesions    NEURO:  alert, approp, nl sensorium with  no motor or cerebellar deficits apparent.   CXR PA and Lateral:   12/01/2021 :    I personally reviewed images and impression is as follows:     Mild CB changes, no as dz      Assessment   Mild persistent asthma without complication Active smoker with rninitis symptoms and ? Asthma  since childhood  - 12/01/2021  After extensive coaching inhaler device,  effectiveness =    90% > continue hfa saba and trelegy short term until return for pfts - d/c acei 12/01/2021 due to hoarseness and upper airway pattern cough   DDX of  difficult airways management almost all start with A and  include Adherence, Ace Inhibitors, Acid Reflux, Active Sinus Disease, Alpha 1 Antitripsin deficiency, Anxiety masquerading as Airways dz,  ABPA,  Allergy(esp in young), Aspiration (esp in elderly), Adverse effects of meds,  Active smoking or vaping, A bunch of PE's (a small clot burden can't cause this syndrome unless there is already severe underlying pulm or vascular dz with poor reserve) plus  two Bs  = Bronchiectasis and Beta blocker use..and one C= CHF  Adherence is always the initial "prime suspect" and is a multilayered concern that requires a "trust but verify" approach in every patient - starting with knowing how to use medications, especially inhalers, correctly, keeping up with refills and understanding the fundamental difference between maintenance and prns vs those medications only taken for a very short course and then stopped and not refilled.  - see hfa teaching  Active smoking at top of the rest of the list  (see separate a/p)   ACEi adverse effects at the  top of the usual list of suspects and the only way to rule it out is a trial off > see a/p    ? Acid (or non-acid) GERD > always difficult to exclude as up to 75% of pts in some series report no assoc GI/ Heartburn symptoms> rec continue  ppi ac daily   ? Adverse effects of DPI > she's convinced improved since started though note still needing freq saba > consider simplify to low dose symbicort  p obtain pfts   ? Allergyies > Eos 0.4 on 08/04/21 will screen on return, in meatime continue clariton      Primary hypertension D/c'd acei 12/01/2021 due to cough/ hoarseness   In the best review of chronic cough to date ( NEJM 2016 375 4098-1191) ,  ACEi are now felt to cause cough in up to  20% of pts which is a 4 fold increase from previous reports and does not include the variety of non-specific complaints we see in pulmonary clinic in pts on ACEi but previously attributed to another dx like  Copd/asthma and  include PNDS, throat  congestion, "bronchitis", unexplained dyspnea and noct "strangling" sensations, and hoarseness, but also  atypical /refractory GERD symptoms like dysphagia and "bad heartburn"   The only way I know  to prove this is not an "ACEi Case" is a trial off ACEi x a minimum of 6 weeks then regroup.   >>> try benicar 20-12.5 one daily in place of lisinopril hczt and regroup in 6 weeks   Discussed  in detail all the  indications, usual  risks and alternatives  relative to the benefits with patient who agrees to proceed with Rx as outlined.        Each maintenance medication was reviewed in detail including emphasizing most importantly the difference between maintenance and prns and under what circumstances the prns are to be triggered using an action plan format where appropriate.  Total time for H and P, chart review, counseling, reviewing hfa device(s) and generating customized AVS unique to this office visit / same day charting  > 45 min with new pt        Cigarette smoker 4-5 min discussion re active cigarette smoking in addition to office E&M  Ask about tobacco use:  ongoing Advise quitting    I reviewed the Fletcher curve with the patient that basically indicates  if you quit smoking when your best day FEV1 is still well preserved (as is clearly  the case here)  it is highly unlikely you will progress to severe disease and informed the patient there was  no medication on the market that has proven to alter the curve/ its downward trajectory  or the likelihood of progression of their disease(unlike other chronic medical conditions such as atheroclerosis where we do think we can change the natural hx with risk reducing meds)    Therefore stopping smoking and maintaining abstinence are  the most important aspects of her care, not choice of inhalers or for that matter, pulmonary doctors.  Treatment other than smoking cessation  is entirely directed by severity of symptoms and focused also on reducing exacerbations, not attempting to change the natural history of the disease.   Assess willingness:  Not committed at this point Assist in quit attempt:  Per PCP when ready Arrange follow up:   Follow up per Primary  Care planned  Also: Low-dose CT lung cancer screening is recommended for patients who are 65-40 years of age with a 20+ pack-year history of smoking and who are currently smoking  or quit <=15 years ago. No coughing up blood  No unintentional weight loss of > 15 pounds in the last 6 months - pt is eligible for scanning yearly until 13 y after quits smoking > defer to PCP until/ unless requested to do thru this office            Christinia Gully, MD 12/01/2021

## 2021-12-01 ENCOUNTER — Encounter: Payer: Self-pay | Admitting: Internal Medicine

## 2021-12-01 ENCOUNTER — Ambulatory Visit (INDEPENDENT_AMBULATORY_CARE_PROVIDER_SITE_OTHER): Payer: BC Managed Care – PPO | Admitting: Internal Medicine

## 2021-12-01 ENCOUNTER — Ambulatory Visit (INDEPENDENT_AMBULATORY_CARE_PROVIDER_SITE_OTHER): Payer: BC Managed Care – PPO

## 2021-12-01 VITALS — BP 126/64 | HR 77 | Temp 97.9°F | Ht 64.0 in | Wt 184.4 lb

## 2021-12-01 DIAGNOSIS — J4530 Mild persistent asthma, uncomplicated: Secondary | ICD-10-CM | POA: Diagnosis not present

## 2021-12-01 DIAGNOSIS — I10 Essential (primary) hypertension: Secondary | ICD-10-CM

## 2021-12-01 DIAGNOSIS — F17210 Nicotine dependence, cigarettes, uncomplicated: Secondary | ICD-10-CM

## 2021-12-01 DIAGNOSIS — R059 Cough, unspecified: Secondary | ICD-10-CM | POA: Diagnosis not present

## 2021-12-01 MED ORDER — OLMESARTAN MEDOXOMIL-HCTZ 20-12.5 MG PO TABS: 1.0000 | ORAL_TABLET | Freq: Every day | ORAL | 2 refills | Status: DC

## 2021-12-01 NOTE — Assessment & Plan Note (Signed)
4-5 min discussion re active cigarette smoking in addition to office E&M  Ask about tobacco use:  ongoing Advise quitting    I reviewed the Fletcher curve with the patient that basically indicates  if you quit smoking when your best day FEV1 is still well preserved (as is clearly  the case here)  it is highly unlikely you will progress to severe disease and informed the patient there was  no medication on the market that has proven to alter the curve/ its downward trajectory  or the likelihood of progression of their disease(unlike other chronic medical conditions such as atheroclerosis where we do think we can change the natural hx with risk reducing meds)    Therefore stopping smoking and maintaining abstinence are  the most important aspects of her care, not choice of inhalers or for that matter, pulmonary doctors.  Treatment other than smoking cessation  is entirely directed by severity of symptoms and focused also on reducing exacerbations, not attempting to change the natural history of the disease.   Assess willingness:  Not committed at this point Assist in quit attempt:  Per PCP when ready Arrange follow up:   Follow up per Primary Care planned  Also: Low-dose CT lung cancer screening is recommended for patients who are 58-59 years of age with a 20+ pack-year history of smoking and who are currently smoking or quit <=15 years ago. No coughing up blood  No unintentional weight loss of > 15 pounds in the last 6 months - pt is eligible for scanning yearly until 57 y after quits smoking > defer to PCP until/ unless requested to do thru this office

## 2021-12-01 NOTE — Assessment & Plan Note (Signed)
D/c'd acei 12/01/2021 due to cough/ hoarseness   In the best review of chronic cough to date ( NEJM 2016 375 2060-1561) ,  ACEi are now felt to cause cough in up to  20% of pts which is a 4 fold increase from previous reports and does not include the variety of non-specific complaints we see in pulmonary clinic in pts on ACEi but previously attributed to another dx like  Copd/asthma and  include PNDS, throat  congestion, "bronchitis", unexplained dyspnea and noct "strangling" sensations, and hoarseness, but also  atypical /refractory GERD symptoms like dysphagia and "bad heartburn"   The only way I know  to prove this is not an "ACEi Case" is a trial off ACEi x a minimum of 6 weeks then regroup.   >>> try benicar 20-12.5 one daily in place of lisinopril hczt and regroup in 6 weeks   Discussed in detail all the  indications, usual  risks and alternatives  relative to the benefits with patient who agrees to proceed with Rx as outlined.        Each maintenance medication was reviewed in detail including emphasizing most importantly the difference between maintenance and prns and under what circumstances the prns are to be triggered using an action plan format where appropriate.  Total time for H and P, chart review, counseling, reviewing hfa device(s) and generating customized AVS unique to this office visit / same day charting  > 45 min with new pt

## 2021-12-01 NOTE — Assessment & Plan Note (Signed)
Active smoker with rninitis symptoms and ? Asthma  since childhood  - 12/01/2021  After extensive coaching inhaler device,  effectiveness =    90% > continue hfa saba and trelegy short term until return for pfts - d/c acei 12/01/2021 due to hoarseness and upper airway pattern cough   DDX of  difficult airways management almost all start with A and  include Adherence, Ace Inhibitors, Acid Reflux, Active Sinus Disease, Alpha 1 Antitripsin deficiency, Anxiety masquerading as Airways dz,  ABPA,  Allergy(esp in young), Aspiration (esp in elderly), Adverse effects of meds,  Active smoking or vaping, A bunch of PE's (a small clot burden can't cause this syndrome unless there is already severe underlying pulm or vascular dz with poor reserve) plus two Bs  = Bronchiectasis and Beta blocker use..and one C= CHF  Adherence is always the initial "prime suspect" and is a multilayered concern that requires a "trust but verify" approach in every patient - starting with knowing how to use medications, especially inhalers, correctly, keeping up with refills and understanding the fundamental difference between maintenance and prns vs those medications only taken for a very short course and then stopped and not refilled.  - see hfa teaching   Active smoking at top of the rest of the list  (see separate a/p)   ACEi adverse effects at the  top of the usual list of suspects and the only way to rule it out is a trial off > see a/p    ? Acid (or non-acid) GERD > always difficult to exclude as up to 75% of pts in some series report no assoc GI/ Heartburn symptoms> rec continue  ppi ac daily   ? Adverse effects of DPI > she's convinced improved since started though note still needing freq saba > consider simplify to low dose symbicort  p obtain pfts   ? Allergyies > Eos 0.4 on 08/04/21 will screen on return, in meatime continue clariton

## 2021-12-01 NOTE — Patient Instructions (Addendum)
The key is to stop smoking completely before smoking completely stops you!    Stop lisinopril  and start olmesartan 20-12.5 one daily in its place    Plan A = Automatic = Always=    trelegy 299 one click each am   Plan B = Backup (to supplement plan A, not to replace it) Only use your albuterol inhaler as a rescue medication to be used if you can't catch your breath by resting or doing a relaxed purse lip breathing pattern.  - The less you use it, the better it will work when you need it. - Ok to use the inhaler up to 2 puffs  every 4 hours if you must but call for appointment if use goes up over your usual need - Don't leave home without it !!  (think of it like the spare tire for your car)    Also  Ok to try albuterol 15 min before an activity (on alternating days)  that you know would usually make you short of breath and see if it makes any difference and if makes none then don't take albuterol after activity unless you can't catch your breath as this means it's the resting that helps, not the albuterol.   Please schedule a follow up office visit in 6 weeks, call sooner if needed with PFT s on return

## 2021-12-02 ENCOUNTER — Encounter: Payer: Self-pay | Admitting: Gastroenterology

## 2021-12-03 ENCOUNTER — Telehealth: Payer: Self-pay | Admitting: Family Medicine

## 2021-12-03 NOTE — Telephone Encounter (Signed)
Patient has colonoscopy on Monday 11/20 and she wants to know if Haley Nielsen can call in the solution to [facility] in [place]

## 2021-12-04 NOTE — Telephone Encounter (Signed)
She needs to contact GI for this.

## 2021-12-05 NOTE — Telephone Encounter (Signed)
Left message advising pt to contact GI and to call us back with any further questions and concerns.

## 2021-12-08 ENCOUNTER — Encounter: Payer: Self-pay | Admitting: Gastroenterology

## 2021-12-08 ENCOUNTER — Ambulatory Visit (AMBULATORY_SURGERY_CENTER): Payer: BC Managed Care – PPO | Admitting: Gastroenterology

## 2021-12-08 VITALS — BP 116/75 | HR 65 | Temp 94.8°F | Resp 14 | Ht 64.0 in | Wt 180.0 lb

## 2021-12-08 DIAGNOSIS — Z1211 Encounter for screening for malignant neoplasm of colon: Secondary | ICD-10-CM

## 2021-12-08 DIAGNOSIS — D124 Benign neoplasm of descending colon: Secondary | ICD-10-CM

## 2021-12-08 DIAGNOSIS — Z800 Family history of malignant neoplasm of digestive organs: Secondary | ICD-10-CM | POA: Diagnosis not present

## 2021-12-08 MED ORDER — SODIUM CHLORIDE 0.9 % IV SOLN: 500.0000 mL | Freq: Once | INTRAVENOUS | Status: DC

## 2021-12-08 NOTE — Progress Notes (Signed)
Called to room to assist during endoscopic procedure.  Patient ID and intended procedure confirmed with present staff. Received instructions for my participation in the procedure from the performing physician.  

## 2021-12-08 NOTE — Progress Notes (Signed)
[facility] History and Physical   Primary Care Physician:  Gwenlyn Perking, FNP   Reason for Procedure:   [facility] screeing  Plan:    colon     HPI: Haley Nielsen is a 56 y.o. female    Past Medical History:  Diagnosis Date   Allergic rhinitis    Allergy    Blurry vision    Bronchospasm    Chest pain    Dizziness    Elevated CK-MB level    GERD (gastroesophageal reflux disease)    Hyperlipidemia    Hypertension    Neuropathy 05/15/2021   Syncope    Syncope    Tobacco abuse    Weakness     Past Surgical History:  Procedure Laterality Date   TUBAL LIGATION      Prior to Admission medications   Medication Sig Start Date End Date Taking? Authorizing Provider  albuterol (VENTOLIN HFA) 108 (90 Base) MCG/ACT inhaler Inhale 1-2 puffs into the lungs every 4 (four) hours as needed for wheezing or shortness of breath. 08/18/21  Yes Gwenlyn Perking, FNP  aspirin EC 81 MG tablet Take 1 tablet (81 mg total) by mouth daily. Swallow whole. 05/07/21  Yes Gwenlyn Perking, FNP  atorvastatin (LIPITOR) 80 MG tablet Take 1 tablet (80 mg total) by mouth daily. Keep on file 11/05/21  Yes Gwenlyn Perking, FNP  fluticasone [facility]) 50 MCG/ACT nasal spray Place 2 sprays into both nostrils daily. 05/07/21  Yes Gwenlyn Perking, FNP  Fluticasone-Umeclidin-Vilant (TRELEGY ELLIPTA) 100-62.5-25 MCG/ACT AEPB Inhale 1 puff into the lungs daily. 08/04/21  Yes Gwenlyn Perking, FNP  loratadine (CLARITIN) 10 MG tablet Take 1 tablet by mouth once daily 09/03/21  Yes Gwenlyn Perking, FNP  methocarbamol (ROBAXIN) 500 MG tablet Take 1 tablet (500 mg total) by mouth every 8 (eight) hours as needed for muscle spasms. 11/05/21  Yes Gwenlyn Perking, FNP  olmesartan-hydrochlorothiazide (BENICAR HCT) 20-12.5 MG tablet Take 1 tablet by mouth daily. 12/01/21  Yes Tanda Rockers, MD  Omega-3 Fatty Acids (FISH OIL) 1000 MG CAPS Take by mouth.   Yes [provider]  omeprazole (PRILOSEC)  20 MG capsule Take 1 capsule by mouth once daily 09/03/21  Yes Gwenlyn Perking, FNP  lidocaine (LIDODERM) 5 % Place 1 patch onto the skin daily. Remove & Discard patch within 12 hours or as directed by MD 11/05/21   Gwenlyn Perking, FNP    Current Outpatient Medications  Medication Sig Dispense Refill   albuterol (VENTOLIN HFA) 108 (90 Base) MCG/ACT inhaler Inhale 1-2 puffs into the lungs every 4 (four) hours as needed for wheezing or shortness of breath. 18 g 2   aspirin EC 81 MG tablet Take 1 tablet (81 mg total) by mouth daily. Swallow whole. 90 tablet 3   atorvastatin (LIPITOR) 80 MG tablet Take 1 tablet (80 mg total) by mouth daily. Keep on file 90 tablet 3   fluticasone (FLONASE) 50 MCG/ACT nasal spray Place 2 sprays into both nostrils daily. 16 g 6   Fluticasone-Umeclidin-Vilant (TRELEGY ELLIPTA) 100-62.5-25 MCG/ACT AEPB Inhale 1 puff into the lungs daily. 1 each 11   loratadine (CLARITIN) 10 MG tablet Take 1 tablet by mouth once daily 90 tablet 1   methocarbamol (ROBAXIN) 500 MG tablet Take 1 tablet (500 mg total) by mouth every 8 (eight) hours as needed for muscle spasms. 180 tablet 0   olmesartan-hydrochlorothiazide (BENICAR HCT) 20-12.5 MG tablet Take 1 tablet by mouth daily. 30 tablet  2   Omega-3 Fatty Acids (FISH OIL) 1000 MG CAPS Take by mouth.     omeprazole (PRILOSEC) 20 MG capsule Take 1 capsule by mouth once daily 90 capsule 1   lidocaine (LIDODERM) 5 % Place 1 patch onto the skin daily. Remove & Discard patch within 12 hours or as directed by MD 30 patch 0   Current Facility-Administered Medications  Medication Dose Route Frequency Provider Last Rate Last Admin   0.9 %  sodium chloride infusion  500 mL Intravenous Once Jackquline Denmark, MD        Allergies as of 12/08/2021 - Review Complete 12/08/2021  Allergen Reaction Noted   Penicillins Hives 01/17/2017    Family History  Problem Relation Age of Onset   Colon polyps Mother    Hyperlipidemia Mother    Hypertension  Mother    Stroke Father 14   Heart attack Maternal Grandmother    Cancer Paternal Grandmother        cervical cancer   Colon cancer Neg Hx    Esophageal cancer Neg Hx    Rectal cancer Neg Hx    Stomach cancer Neg Hx     Social History   Socioeconomic History   Marital status: Married    Spouse name: Not on file   Number of children: Not on file   Years of education: 11   Highest education level: 11th grade  Occupational History   Not on file  Tobacco Use   Smoking status: Every Day    Packs/day: 0.25    Years: 15.00    Total pack years: 3.75    Types: Cigarettes   Smokeless tobacco: Never  Vaping Use   Vaping Use: Never used  Substance and Sexual Activity   Alcohol use: Not Currently   Drug use: Never   Sexual activity: Yes    Birth control/protection: Surgical  Other Topics Concern   Not on file  Social History Narrative   Not on file   Social Determinants of Health   Financial Resource Strain: Not on file  Food Insecurity: Not on file  Transportation Needs: Not on file  Physical Activity: Not on file  Stress: Not on file  Social Connections: Not on file  Intimate Partner Violence: Not on file    Review of Systems: Positive for NONE All other review of systems negative except as mentioned in the HPI.  Physical Exam: Vital signs in last 24 hours: '@VSRANGES'$ @   General:   Alert,  Well-developed, well-nourished, pleasant and cooperative in NAD Lungs:  Clear throughout to auscultation.   Heart:  Regular rate and rhythm; no murmurs, clicks, rubs,  or gallops. Abdomen:  Soft, nontender and nondistended. Normal bowel sounds.   Neuro/Psych:  Alert and cooperative. Normal mood and affect. A and O x 3    No significant changes were identified.  The patient continues to be an appropriate candidate for the planned procedure and anesthesia.   Carmell Austria, MD. [facility] 12/08/2021 8:03 AM@

## 2021-12-08 NOTE — Progress Notes (Signed)
Report given to PACU, vss 

## 2021-12-08 NOTE — Op Note (Signed)
[facility] Patient Name: Haley Nielsen Procedure Date: 12/08/2021 8:04 AM MRN: [id number] Endoscopist: Jackquline Denmark , MD, [id number] Age: 56 Referring MD:  Date of Birth: 1965/12/20 Gender: Female Account #: [id number] Procedure:                Colonoscopy Indications:              Colon cancer screening in patient at increased                            risk: FH of colon polyps (mom) at age 32 years (or                            older) Medicines:                Monitored Anesthesia Care Procedure:                Pre-Anesthesia Assessment:                           - Prior to the procedure, a History and Physical                            was performed, and patient medications and                            allergies were reviewed. The patient's tolerance of                            previous anesthesia was also reviewed. The risks                            and benefits of the procedure and the sedation                            options and risks were discussed with the patient.                            All questions were answered, and informed consent                            was obtained. Prior Anticoagulants: The patient has                            taken no anticoagulant or antiplatelet agents. ASA                            Grade Assessment: II - A patient with mild systemic                            disease. After reviewing the risks and benefits,                            the patient was deemed in satisfactory condition to  undergo the procedure.                           After obtaining informed consent, the colonoscope                            was passed under direct vision. Throughout the                            procedure, the patient's blood pressure, pulse, and                            oxygen saturations were monitored continuously. The                            Olympus PCF-H190DL ([id number]) Colonoscope was                             introduced through the anus and advanced to the the                            cecum, identified by appendiceal orifice and                            ileocecal valve. The colonoscopy was performed                            without difficulty. The patient tolerated the                            procedure well. The quality of the bowel                            preparation was good. The ileocecal valve,                            appendiceal orifice, and rectum were photographed. Scope In: 8:09:41 AM Scope Out: 8:22:59 AM Total Procedure Duration: 0 hours 13 minutes 18 seconds  Findings:                 A 6 mm polyp was found in the mid descending colon.                            The polyp was sessile. The polyp was removed with a                            cold snare. Resection and retrieval were complete.                           Multiple medium-mouthed diverticula were found in                            the sigmoid colon, descending colon and ascending  colon.                           Non-bleeding internal hemorrhoids were found during                            retroflexion. The hemorrhoids were small and Grade                            I (internal hemorrhoids that do not prolapse).                           The exam was otherwise without abnormality on                            direct and retroflexion views. Complications:            No immediate complications. Estimated Blood Loss:     Estimated blood loss: none. Impression:               - One 6 mm polyp in the mid descending colon,                            removed with a cold snare. Resected and retrieved.                           - Diverticulosis in the sigmoid colon, in the                            descending colon and in the ascending colon.                           - Non-bleeding internal hemorrhoids.                           - The examination was otherwise normal  on direct                            and retroflexion views. Recommendation:           - Patient has a contact number available for                            emergencies. The signs and symptoms of potential                            delayed complications were discussed with the                            patient. Return to normal activities tomorrow.                            Written discharge instructions were provided to the                            patient.                           -  High fiber diet.                           - Continue present medications.                           - Await pathology results.                           - Repeat colonoscopy in 5 years for surveillance                            d/t strong FH of polyps.                           - The findings and recommendations were discussed                            with the patient's family. Jackquline Denmark, MD 12/08/2021 8:27:07 AM This report has been signed electronically.

## 2021-12-08 NOTE — Patient Instructions (Signed)
Handouts on polyps, hemorrhoids, and diverticulosis given to you today  Await pathology results   YOU HAD AN ENDOSCOPIC PROCEDURE TODAY AT :   Refer to the procedure report that was given to you for any specific questions about what was found during the examination.  If the procedure report does not answer your questions, please call your gastroenterologist to clarify.  If you requested that your care partner not be given the details of your procedure findings, then the procedure report has been included in a sealed envelope for you to review at your convenience later.  YOU SHOULD EXPECT: Some feelings of bloating in the abdomen. Passage of more gas than usual.  Walking can help get rid of the air that was put into your GI tract during the procedure and reduce the bloating. If you had a lower endoscopy (such as a colonoscopy or flexible sigmoidoscopy) you may notice spotting of blood in your stool or on the toilet paper. If you underwent a bowel prep for your procedure, you may not have a normal bowel movement for a few days.  Please Note:  You might notice some irritation and congestion in your nose or some drainage.  This is from the oxygen used during your procedure.  There is no need for concern and it should clear up in a day or so.  SYMPTOMS TO REPORT IMMEDIATELY:  Following lower endoscopy (colonoscopy or flexible sigmoidoscopy):  Excessive amounts of blood in the stool  Significant tenderness or worsening of abdominal pains  Swelling of the abdomen that is new, acute  Fever of 100F or higher  For urgent or emergent issues, a gastroenterologist can be reached at any hour by calling [phone]. Do not use MyChart messaging for urgent concerns.    DIET:  We do recommend a small meal at first, but then you may proceed to your regular diet.  Drink plenty of fluids but you should avoid alcoholic beverages for 24 hours.  ACTIVITY:  You should plan to take it  easy for the rest of today and you should NOT DRIVE or use heavy machinery until tomorrow (because of the sedation medicines used during the test).    FOLLOW UP: Our staff will call the number listed on your records the next business day following your procedure.  We will call around 7:15- 8:00 am to check on you and address any questions or concerns that you may have regarding the information given to you following your procedure. If we do not reach you, we will leave a message.     If any biopsies were taken you will be contacted by phone or by letter within the next 1-3 weeks.  Please call us at [phone] if you have not heard about the biopsies in 3 weeks.    SIGNATURES/CONFIDENTIALITY: You and/or your care partner have signed paperwork which will be entered into your electronic medical record.  These signatures attest to the fact that that the information above on your After Visit Summary has been reviewed and is understood.  Full responsibility of the confidentiality of this discharge information lies with you and/or your care-partner.

## 2021-12-08 NOTE — Progress Notes (Signed)
Pt's states no medical or surgical changes since previsit or office visit. 

## 2021-12-09 ENCOUNTER — Telehealth: Payer: Self-pay | Admitting: *Deleted

## 2021-12-09 NOTE — Telephone Encounter (Signed)
  Follow up Call-     12/08/2021    7:21 AM  Call back number  Post procedure Call Back phone  # [phone]  Permission to leave phone message Yes     Patient questions:  Do you have a fever, pain , or abdominal swelling? No. Pain Score  0 *  Have you tolerated food without any problems? Yes.    Have you been able to return to your normal activities? Yes.    Do you have any questions about your discharge instructions: Diet   No. Medications  No. Follow up visit  No.  Do you have questions or concerns about your Care? No.  Actions: * If pain score is 4 or above: No action needed, pain <4.

## 2021-12-21 ENCOUNTER — Encounter: Payer: Self-pay | Admitting: Gastroenterology

## 2022-01-06 ENCOUNTER — Other Ambulatory Visit: Payer: Self-pay | Admitting: Family Medicine

## 2022-01-06 DIAGNOSIS — J4530 Mild persistent asthma, uncomplicated: Secondary | ICD-10-CM

## 2022-01-22 ENCOUNTER — Other Ambulatory Visit: Payer: Self-pay | Admitting: Family Medicine

## 2022-01-22 DIAGNOSIS — I10 Essential (primary) hypertension: Secondary | ICD-10-CM

## 2022-01-27 ENCOUNTER — Ambulatory Visit: Payer: BC Managed Care – PPO | Admitting: Internal Medicine

## 2022-02-05 LAB — LAB REPORT - SCANNED
A1c: 5.9
EGFR: 102

## 2022-02-06 ENCOUNTER — Encounter: Payer: BC Managed Care – PPO | Admitting: Family Medicine

## 2022-03-06 ENCOUNTER — Other Ambulatory Visit: Payer: Self-pay

## 2022-03-06 DIAGNOSIS — J4530 Mild persistent asthma, uncomplicated: Secondary | ICD-10-CM

## 2022-03-09 ENCOUNTER — Ambulatory Visit: Payer: BC Managed Care – PPO | Admitting: Internal Medicine

## 2022-03-15 ENCOUNTER — Other Ambulatory Visit: Payer: Self-pay | Admitting: Family Medicine

## 2022-03-15 DIAGNOSIS — I10 Essential (primary) hypertension: Secondary | ICD-10-CM

## 2022-03-16 NOTE — Telephone Encounter (Signed)
Refill denied as it looks like pulmonology discontinued due to cough.

## 2022-03-18 ENCOUNTER — Telehealth: Payer: Self-pay | Admitting: Family Medicine

## 2022-03-18 MED ORDER — OLMESARTAN MEDOXOMIL-HCTZ 20-12.5 MG PO TABS: 1.0000 | ORAL_TABLET | Freq: Every day | ORAL | 0 refills | Status: DC

## 2022-03-18 NOTE — Telephone Encounter (Signed)
Refill on olmesartan sent in per verbal order from [facility] and pt is aware.

## 2022-03-18 NOTE — Telephone Encounter (Signed)
  Prescription Request  03/18/2022  Is this a "Controlled Substance" medicine? no  Have you seen your PCP in the last 2 weeks? No pt has appt on 04/02/2022 she is out of meds  If YES, route message to pool  -  If NO, patient needs to be scheduled for appointment.  What is the name of the medication or equipment? BP medication pt does not know name of it   Have you contacted your pharmacy to request a refill? yes   Which pharmacy would you like this sent to? [facility]    Patient notified that their request is being sent to the clinical staff for review and that they should receive a response within 2 business days.

## 2022-03-26 ENCOUNTER — Encounter: Payer: BC Managed Care – PPO | Admitting: Obstetrics and Gynecology

## 2022-04-02 ENCOUNTER — Ambulatory Visit (INDEPENDENT_AMBULATORY_CARE_PROVIDER_SITE_OTHER): Payer: BC Managed Care – PPO | Admitting: Family Medicine

## 2022-04-02 ENCOUNTER — Encounter: Payer: Self-pay | Admitting: Family Medicine

## 2022-04-02 VITALS — BP 149/94 | HR 91 | Temp 98.0°F | Ht 64.0 in | Wt 181.1 lb

## 2022-04-02 DIAGNOSIS — I700 Atherosclerosis of aorta: Secondary | ICD-10-CM

## 2022-04-02 DIAGNOSIS — E782 Mixed hyperlipidemia: Secondary | ICD-10-CM

## 2022-04-02 DIAGNOSIS — Z0001 Encounter for general adult medical examination with abnormal findings: Secondary | ICD-10-CM | POA: Diagnosis not present

## 2022-04-02 DIAGNOSIS — R7303 Prediabetes: Secondary | ICD-10-CM

## 2022-04-02 DIAGNOSIS — I10 Essential (primary) hypertension: Secondary | ICD-10-CM | POA: Diagnosis not present

## 2022-04-02 DIAGNOSIS — F419 Anxiety disorder, unspecified: Secondary | ICD-10-CM

## 2022-04-02 DIAGNOSIS — Z6831 Body mass index (BMI) 31.0-31.9, adult: Secondary | ICD-10-CM

## 2022-04-02 DIAGNOSIS — J4530 Mild persistent asthma, uncomplicated: Secondary | ICD-10-CM

## 2022-04-02 DIAGNOSIS — Z0000 Encounter for general adult medical examination without abnormal findings: Secondary | ICD-10-CM

## 2022-04-02 DIAGNOSIS — E6609 Other obesity due to excess calories: Secondary | ICD-10-CM

## 2022-04-02 DIAGNOSIS — Z23 Encounter for immunization: Secondary | ICD-10-CM | POA: Diagnosis not present

## 2022-04-02 LAB — BAYER DCA HB A1C WAIVED: HB A1C (BAYER DCA - WAIVED): 5.4 % (ref 4.8–5.6)

## 2022-04-02 MED ORDER — OLMESARTAN MEDOXOMIL-HCTZ 20-12.5 MG PO TABS: 1.0000 | ORAL_TABLET | Freq: Every day | ORAL | 3 refills | Status: DC

## 2022-04-02 MED ORDER — BUSPIRONE HCL 5 MG PO TABS: 5.0000 mg | ORAL_TABLET | Freq: Two times a day (BID) | ORAL | 1 refills | Status: DC

## 2022-04-02 NOTE — Progress Notes (Signed)
Complete physical exam  Patient: Haley Nielsen   DOB: 15-Oct-1965   57 y.o. Female  MRN: [id number]  Subjective:    Chief Complaint  Patient presents with   Annual Exam    Tamlyn Pacitti is a 57 y.o. female who presents today for a complete physical exam. She reports consuming a general diet. The patient has a physically strenuous job, but has no regular exercise apart from work.  She generally feels fairly well. She reports sleeping fairly well. She does have additional problems to discuss today.   Anxiety Clarise Cruz reports increased anxiety for the last few days. She recently found out that her infant grandchild has bone cancer and will need a bone marrow transplant. She has been very worried about this and has had difficulty sleeping during to the worry. She has also had difficulty with focus at work. She would like to try medication to help with symptoms.  HTN Complaint with meds - Yes Current Medications - has missed some of her medication recently due to being out. Did not take BP meds this morning.  Checking BP at home - no Pertinent ROS:  Fatigue - No Visual Disturbances - No Chest pain - No Dyspnea - No Palpitations - No LE edema - No  2. HLD She is taking Lipitor 80 mg daily. Also on omega 3 supplement. She eats a regular diet.   3. Asthma Reports improvement in symptoms. She is now only using albuterol once a day. She has a follow up with pulmonology in a few weeks. She is currently on trelegy daily.    Most recent fall risk assessment:    04/02/2022    8:11 AM  Fall Risk   Falls in the past year? 1  Number falls in past yr: 0  Injury with Fall? 0  Risk for fall due to : History of fall(s)  Follow up Falls evaluation completed     Most recent depression screenings:    04/02/2022    8:11 AM 11/05/2021    5:22 PM  PHQ 2/9 Scores  PHQ - 2 Score 0 2  PHQ- 9 Score 3 2      04/02/2022    8:12 AM 11/05/2021    5:22 PM 08/04/2021    4:23 PM 05/22/2021    7:59 AM   GAD 7 : Generalized Anxiety Score  Nervous, Anxious, on Edge 0 0 0 0  Control/stop worrying 0 0 0 0  Worry too much - different things 3 0 0 0  Trouble relaxing 2 0 3 0  Restless 0 0 0 0  Easily annoyed or irritable 0 0 0 0  Afraid - awful might happen 0 0 0 0  Total GAD 7 Score 5 0 3 0  Anxiety Difficulty Not difficult at all Not difficult at all Not difficult at all Not difficult at all        Past Medical History:  Diagnosis Date   Allergic rhinitis    Allergy    Blurry vision    Bronchospasm    Chest pain    Dizziness    Elevated CK-MB level    GERD (gastroesophageal reflux disease)    Hyperlipidemia    Hypertension    Neuropathy 05/15/2021   Syncope    Syncope    Tobacco abuse    Weakness       Patient Care Team: Gwenlyn Perking, FNP as PCP - General (Family Medicine)   Outpatient Medications Prior to Visit  Medication  Sig   aspirin EC 81 MG tablet Take 1 tablet (81 mg total) by mouth daily. Swallow whole.   atorvastatin (LIPITOR) 80 MG tablet Take 1 tablet (80 mg total) by mouth daily. Keep on file   fluticasone (FLONASE) 50 MCG/ACT nasal spray Place 2 sprays into both nostrils daily.   Fluticasone-Umeclidin-Vilant (TRELEGY ELLIPTA) 100-62.5-25 MCG/ACT AEPB Inhale 1 puff into the lungs daily.   lidocaine (LIDODERM) 5 % Place 1 patch onto the skin daily. Remove & Discard patch within 12 hours or as directed by MD   loratadine (CLARITIN) 10 MG tablet Take 1 tablet by mouth once daily   methocarbamol (ROBAXIN) 500 MG tablet Take 1 tablet (500 mg total) by mouth every 8 (eight) hours as needed for muscle spasms.   olmesartan-hydrochlorothiazide (BENICAR HCT) 20-12.5 MG tablet Take 1 tablet by mouth daily.   Omega-3 Fatty Acids (FISH OIL) 1000 MG CAPS Take by mouth.   omeprazole (PRILOSEC) 20 MG capsule Take 1 capsule by mouth once daily   VENTOLIN HFA 108 (90 Base) MCG/ACT inhaler INHALE 1 TO 2 PUFFS BY MOUTH EVERY 4 HOURS AS NEEDED FOR WHEEZING OR SHORTNESS OF  BREATH   No facility-administered medications prior to visit.    ROS Negative unless specially indicated above in HPI.     Objective:     BP (!) 149/94   Pulse 91   Temp 98 F (36.7 C) (Temporal)   Ht '5\' 4"'$  (1.626 m)   Wt 181 lb 2 oz (82.2 kg)   SpO2 94%   BMI 31.09 kg/m    Physical Exam Vitals and nursing note reviewed.  Constitutional:      General: She is not in acute distress.    Appearance: Normal appearance. She is not ill-appearing, toxic-appearing or diaphoretic.  HENT:     Head: Normocephalic and atraumatic.     Right Ear: Tympanic membrane, ear canal and external ear normal.     Left Ear: Tympanic membrane, ear canal and external ear normal.     Nose: Nose normal.     Mouth/Throat:     Mouth: Mucous membranes are moist.     Pharynx: Oropharynx is clear.  Eyes:     Extraocular Movements: Extraocular movements intact.     Conjunctiva/sclera: Conjunctivae normal.     Pupils: Pupils are equal, round, and reactive to light.  Neck:     Thyroid: No thyroid mass, thyromegaly or thyroid tenderness.  Cardiovascular:     Rate and Rhythm: Normal rate and regular rhythm.     Pulses: Normal pulses.     Heart sounds: Normal heart sounds. No murmur heard.    No friction rub. No gallop.  Pulmonary:     Effort: Pulmonary effort is normal.     Breath sounds: Normal breath sounds.  Abdominal:     General: Bowel sounds are normal. There is no distension.     Palpations: Abdomen is soft. There is no mass.     Tenderness: There is no abdominal tenderness. There is no guarding.  Musculoskeletal:        General: No tenderness.     Cervical back: Neck supple. No rigidity.     Right lower leg: No edema.     Left lower leg: No edema.  Skin:    General: Skin is warm and dry.     Capillary Refill: Capillary refill takes less than 2 seconds.     Findings: No lesion or rash.  Neurological:     General: No  focal deficit present.     Mental Status: She is alert and oriented  to person, place, and time.     Cranial Nerves: No cranial nerve deficit.     Motor: No weakness.     Gait: Gait normal.  Psychiatric:        Mood and Affect: Mood normal.        Behavior: Behavior normal.        Thought Content: Thought content normal.        Judgment: Judgment normal.      No results found for any visits on 04/02/22.     Assessment & Plan:    Routine Health Maintenance and Physical Exam  Carolann was seen today for annual exam.  Diagnoses and all orders for this visit:  Routine general medical examination at a health care facility  Primary hypertension BP not at goal today, however she has not taken her medication today. Discussed compliance with medications. Fasting labs pending. Will recheck in 4 weeks.  -     CBC with Differential/Platelet -     CMP14+EGFR -     TSH -     olmesartan-hydrochlorothiazide (BENICAR HCT) 20-12.5 MG tablet; Take 1 tablet by mouth daily.  Mixed hyperlipidemia Fasting panel pending. On statin and omega 3. -     Lipid panel  Aortic atherosclerosis (HCC) On aspirin and statin.   Class 1 obesity due to excess calories with serious comorbidity and body mass index (BMI) of 31.0 to 31.9 in adult Diet and exercise.   Mild persistent asthma without complication Keep follow up with pulmonology. Improving symptoms.   Prediabetes A1c pending.  -     Bayer DCA Hb A1c Waived  Anxiety Start buspar as below. Will recheck in 4 weeks.  -     busPIRone (BUSPAR) 5 MG tablet; Take 1 tablet (5 mg total) by mouth 2 (two) times daily.    Immunization History  Administered Date(s) Administered   Influenza-Unspecified 11/10/2021   Tdap 07/21/2006, 05/21/2018, 12/26/2019   Zoster Recombinat (Shingrix) 05/22/2021    Health Maintenance  Topic Date Due   PAP SMEAR-Modifier  Never done   Zoster Vaccines- Shingrix (2 of 2) 07/17/2021   Hepatitis C Screening  05/08/2022 (Originally 01/25/1983)   HIV Screening  05/08/2022 (Originally  01/25/1980)   COVID-19 Vaccine (1) 05/24/2022 (Originally 07/24/1965)   MAMMOGRAM  05/22/2023   COLONOSCOPY (Pts 45-59yr Insurance coverage will need to be confirmed)  12/09/2026   DTaP/Tdap/Td (4 - Td or Tdap) 12/25/2029   INFLUENZA VACCINE  Completed   HPV VACCINES  Aged Out    Discussed health benefits of physical activity, and encouraged her to engage in regular exercise appropriate for her age and condition.  Problem List Items Addressed This Visit       Cardiovascular and Mediastinum   Primary hypertension   Relevant Medications   olmesartan-hydrochlorothiazide (BENICAR HCT) 20-12.5 MG tablet   Other Relevant Orders   CBC with Differential/Platelet   CMP14+EGFR   TSH   Aortic atherosclerosis (HCC)   Relevant Medications   olmesartan-hydrochlorothiazide (BENICAR HCT) 20-12.5 MG tablet     Respiratory   Mild persistent asthma without complication     Digestive   Gastroesophageal reflux disease     Other   Mixed hyperlipidemia   Relevant Medications   olmesartan-hydrochlorothiazide (BENICAR HCT) 20-12.5 MG tablet   Other Relevant Orders   Lipid panel   Class 1 obesity due to excess calories with serious comorbidity and body mass index (  BMI) of 31.0 to 31.9 in adult   Prediabetes   Relevant Orders   Bayer DCA Hb A1c Waived   Anxiety   Relevant Medications   busPIRone (BUSPAR) 5 MG tablet   Other Visit Diagnoses     Routine general medical examination at a health care facility    -  Primary      Return in about 4 weeks (around 04/30/2022) for anxiety.     Gwenlyn Perking, FNP

## 2022-04-02 NOTE — Patient Instructions (Signed)

## 2022-04-02 NOTE — Addendum Note (Signed)
Addended by: Milas Hock on: 04/02/2022 04:29 PM   Modules accepted: Orders

## 2022-04-03 LAB — CBC WITH DIFFERENTIAL/PLATELET
Basophils Absolute: 0 10*3/uL (ref 0.0–0.2)
Basos: 1 %
EOS (ABSOLUTE): 0.2 10*3/uL (ref 0.0–0.4)
Eos: 2 %
Hematocrit: 42.3 % (ref 34.0–46.6)
Hemoglobin: 14.3 g/dL (ref 11.1–15.9)
Immature Grans (Abs): 0 10*3/uL (ref 0.0–0.1)
Immature Granulocytes: 0 %
Lymphocytes Absolute: 1.9 10*3/uL (ref 0.7–3.1)
Lymphs: 24 %
MCH: 31.2 pg (ref 26.6–33.0)
MCHC: 33.8 g/dL (ref 31.5–35.7)
MCV: 92 fL (ref 79–97)
Monocytes Absolute: 0.5 10*3/uL (ref 0.1–0.9)
Monocytes: 6 %
Neutrophils Absolute: 5.5 10*3/uL (ref 1.4–7.0)
Neutrophils: 67 %
Platelets: 378 10*3/uL (ref 150–450)
RBC: 4.59 x10E6/uL (ref 3.77–5.28)
RDW: 12.7 % (ref 11.7–15.4)
WBC: 8.1 10*3/uL (ref 3.4–10.8)

## 2022-04-03 LAB — CMP14+EGFR
ALT: 20 IU/L (ref 0–32)
AST: 18 IU/L (ref 0–40)
Albumin/Globulin Ratio: 2 (ref 1.2–2.2)
Albumin: 4.5 g/dL (ref 3.8–4.9)
Alkaline Phosphatase: 99 IU/L (ref 44–121)
BUN/Creatinine Ratio: 16 (ref 9–23)
BUN: 12 mg/dL (ref 6–24)
Bilirubin Total: 0.4 mg/dL (ref 0.0–1.2)
CO2: 23 mmol/L (ref 20–29)
Calcium: 9.9 mg/dL (ref 8.7–10.2)
Chloride: 103 mmol/L (ref 96–106)
Creatinine, Ser: 0.73 mg/dL (ref 0.57–1.00)
Globulin, Total: 2.3 g/dL (ref 1.5–4.5)
Glucose: 103 mg/dL — ABNORMAL HIGH (ref 70–99)
Potassium: 4.1 mmol/L (ref 3.5–5.2)
Sodium: 141 mmol/L (ref 134–144)
Total Protein: 6.8 g/dL (ref 6.0–8.5)
eGFR: 96 mL/min/{1.73_m2} (ref >59)

## 2022-04-03 LAB — LIPID PANEL
Chol/HDL Ratio: 3.9 ratio (ref 0.0–4.4)
Cholesterol, Total: 182 mg/dL (ref 100–199)
HDL: 47 mg/dL (ref >39)
LDL Chol Calc (NIH): 114 mg/dL — ABNORMAL HIGH (ref 0–99)
Triglycerides: 116 mg/dL (ref 0–149)
VLDL Cholesterol Cal: 21 mg/dL (ref 5–40)

## 2022-04-03 LAB — TSH: TSH: 1.29 u[IU]/mL (ref 0.450–4.500)

## 2022-04-06 ENCOUNTER — Other Ambulatory Visit: Payer: Self-pay | Admitting: Family Medicine

## 2022-04-06 DIAGNOSIS — E782 Mixed hyperlipidemia: Secondary | ICD-10-CM

## 2022-04-06 MED ORDER — EZETIMIBE 10 MG PO TABS: 10.0000 mg | ORAL_TABLET | Freq: Every day | ORAL | 3 refills | Status: DC

## 2022-04-23 ENCOUNTER — Other Ambulatory Visit: Payer: Self-pay | Admitting: Family Medicine

## 2022-04-23 DIAGNOSIS — K219 Gastro-esophageal reflux disease without esophagitis: Secondary | ICD-10-CM

## 2022-04-23 DIAGNOSIS — J309 Allergic rhinitis, unspecified: Secondary | ICD-10-CM

## 2022-05-01 ENCOUNTER — Encounter: Payer: Self-pay | Admitting: Family Medicine

## 2022-05-01 ENCOUNTER — Ambulatory Visit: Payer: BC Managed Care – PPO | Admitting: Family Medicine

## 2022-05-01 ENCOUNTER — Other Ambulatory Visit (HOSPITAL_COMMUNITY)
Admission: RE | Admit: 2022-05-01 | Discharge: 2022-05-01 | Disposition: A | Discharge status: Home / Self Care | Payer: BC Managed Care – PPO | Source: Ambulatory Visit | Attending: Family Medicine | Admitting: Family Medicine

## 2022-05-01 VITALS — BP 144/79 | HR 74 | Temp 98.0°F | Ht 64.0 in | Wt 184.0 lb

## 2022-05-01 DIAGNOSIS — Z1231 Encounter for screening mammogram for malignant neoplasm of breast: Secondary | ICD-10-CM

## 2022-05-01 DIAGNOSIS — F419 Anxiety disorder, unspecified: Secondary | ICD-10-CM

## 2022-05-01 DIAGNOSIS — Z124 Encounter for screening for malignant neoplasm of cervix: Secondary | ICD-10-CM | POA: Diagnosis not present

## 2022-05-01 DIAGNOSIS — I10 Essential (primary) hypertension: Secondary | ICD-10-CM

## 2022-05-01 DIAGNOSIS — N889 Noninflammatory disorder of cervix uteri, unspecified: Secondary | ICD-10-CM

## 2022-05-01 DIAGNOSIS — F320 Major depressive disorder, single episode, mild: Secondary | ICD-10-CM

## 2022-05-01 MED ORDER — CITALOPRAM HYDROBROMIDE 20 MG PO TABS: 20.0000 mg | ORAL_TABLET | Freq: Every day | ORAL | 3 refills | Status: DC

## 2022-05-01 NOTE — Progress Notes (Signed)
Established Patient Office Visit  Subjective   Patient ID: Haley Nielsen, female    DOB: 12/20/1965  Age: 57 y.o. MRN: [id number]  Chief Complaint  Patient presents with   Anxiety   Gynecologic Exam    HPI Musiq is here for a follow up of anxiety and for a PAP.   She started of buspar at her last visit. She does feel like this has been helpful, but she continues to feel quite anxious. She is also having some depression symptoms now and this has been bothersome. She has been having crying spells.   She cannot recall the last time that she had a pap smear. She has never had an abnormal pap smear. She is sexually have with one female partner (husband). She denies pain, discharge, itching, dryness, bleeding, pain or bleeding with intercourse. She has not had a cycle in over 3 years.      05/01/2022   10:19 AM 04/02/2022    8:11 AM 11/05/2021    5:22 PM  Depression screen PHQ 2/9  Decreased Interest 0 0 1  Down, Depressed, Hopeless 2 0 1  PHQ - 2 Score 2 0 2  Altered sleeping 0 0 0  Tired, decreased energy 2 3 0  Change in appetite 0 0 0  Feeling bad or failure about yourself  0 0 0  Trouble concentrating 0 0 0  Moving slowly or fidgety/restless 0 0 0  Suicidal thoughts 0 0 0  PHQ-9 Score 4 3 2   Difficult doing work/chores Not difficult at all Not difficult at all Not difficult at all      05/01/2022   10:19 AM 04/02/2022    8:12 AM 11/05/2021    5:22 PM 08/04/2021    4:23 PM  GAD 7 : Generalized Anxiety Score  Nervous, Anxious, on Edge 0 0 0 0  Control/stop worrying 2 0 0 0  Worry too much - different things 2 3 0 0  Trouble relaxing 2 2 0 3  Restless 1 0 0 0  Easily annoyed or irritable 0 0 0 0  Afraid - awful might happen 1 0 0 0  Total GAD 7 Score 8 5 0 3  Anxiety Difficulty Somewhat difficult Not difficult at all Not difficult at all Not difficult at all       ROS As per HPI.    Objective:     BP (!) 144/79   Pulse 74   Temp 98 F (36.7 C) (Temporal)    Ht 5\' 4"  (1.626 m)   Wt 184 lb (83.5 kg)   SpO2 98%   BMI 31.58 kg/m    Physical Exam Vitals and nursing note reviewed. Exam conducted with a chaperone present.  Constitutional:      General: She is not in acute distress.    Appearance: She is not ill-appearing, toxic-appearing or diaphoretic.  Cardiovascular:     Rate and Rhythm: Normal rate and regular rhythm.     Heart sounds: Normal heart sounds. No murmur heard. Pulmonary:     Effort: Pulmonary effort is normal. No respiratory distress.     Breath sounds: Normal breath sounds.  Abdominal:     General: Bowel sounds are normal.     Palpations: Abdomen is soft.  Genitourinary:    General: Normal vulva.     Exam position: Lithotomy position.     Labia:        Right: No rash, tenderness or lesion.  Left: No rash, tenderness or lesion.      Vagina: No signs of injury and foreign body. No vaginal discharge, erythema, tenderness, bleeding, lesions or prolapsed vaginal walls.     Cervix: Lesion (erythematous growth at cervical os with friability) and erythema present.     Uterus: Normal.      Adnexa: Right adnexa normal.  Musculoskeletal:     Right lower leg: No edema.     Left lower leg: No edema.  Skin:    General: Skin is warm and dry.  Neurological:     General: No focal deficit present.     Mental Status: She is alert and oriented to person, place, and time.  Psychiatric:        Mood and Affect: Mood normal.        Behavior: Behavior normal.      No results found for any visits on 05/01/22.    The 10-year ASCVD risk score (Arnett DK, et al., 2019) is: 16.3%    Assessment & Plan:   Shawndrea was seen today for anxiety and gynecologic exam.  Diagnoses and all orders for this visit:  Primary hypertension BP not at goal today. Discussed compliance with medication and lifestyle factors. Will recheck at next visit.   Anxiety Depression, major, single episode, mild Uncontrolled. Denies SI. Start celexa as  below.  -     citalopram (CELEXA) 20 MG tablet; Take 1 tablet (20 mg total) by mouth daily.  Screening for malignant neoplasm of cervix -     Cytology - PAP  Abnormal appearance of cervix Referral to GYN for further evaluation.  -     Ambulatory referral to Gynecology  Breast cancer screening by mammogram She will schedule on mammo bus.    Return in about 4 weeks (around 05/29/2022) for anixety/depression.   The patient indicates understanding of these issues and agrees with the plan.  Gabriel Earing, FNP

## 2022-05-04 ENCOUNTER — Ambulatory Visit: Payer: BC Managed Care – PPO | Admitting: Family Medicine

## 2022-05-07 ENCOUNTER — Ambulatory Visit: Payer: BC Managed Care – PPO | Admitting: Internal Medicine

## 2022-05-07 ENCOUNTER — Other Ambulatory Visit: Payer: Self-pay | Admitting: Family Medicine

## 2022-05-07 ENCOUNTER — Ambulatory Visit: Payer: BC Managed Care – PPO | Admitting: Nurse Practitioner

## 2022-05-07 DIAGNOSIS — Z1231 Encounter for screening mammogram for malignant neoplasm of breast: Secondary | ICD-10-CM

## 2022-05-07 LAB — CYTOLOGY - PAP
Chlamydia: NEGATIVE
Comment: NEGATIVE
Comment: NEGATIVE
Comment: NORMAL
Diagnosis: NEGATIVE
Diagnosis: REACTIVE
High risk HPV: NEGATIVE
Neisseria Gonorrhea: NEGATIVE

## 2022-05-08 ENCOUNTER — Other Ambulatory Visit: Payer: Self-pay | Admitting: Family Medicine

## 2022-05-08 DIAGNOSIS — B9689 Other specified bacterial agents as the cause of diseases classified elsewhere: Secondary | ICD-10-CM

## 2022-05-08 MED ORDER — METRONIDAZOLE 500 MG PO TABS
500.0000 mg | ORAL_TABLET | Freq: Two times a day (BID) | ORAL | 0 refills | Status: DC
Start: 2022-05-08 — End: 2022-05-18

## 2022-05-12 DIAGNOSIS — I700 Atherosclerosis of aorta: Secondary | ICD-10-CM | POA: Diagnosis not present

## 2022-05-12 DIAGNOSIS — D3501 Benign neoplasm of right adrenal gland: Secondary | ICD-10-CM | POA: Diagnosis not present

## 2022-05-12 DIAGNOSIS — D72829 Elevated white blood cell count, unspecified: Secondary | ICD-10-CM | POA: Diagnosis not present

## 2022-05-12 DIAGNOSIS — Z79899 Other long term (current) drug therapy: Secondary | ICD-10-CM | POA: Diagnosis not present

## 2022-05-12 DIAGNOSIS — I119 Hypertensive heart disease without heart failure: Secondary | ICD-10-CM | POA: Diagnosis not present

## 2022-05-12 DIAGNOSIS — M549 Dorsalgia, unspecified: Secondary | ICD-10-CM | POA: Diagnosis not present

## 2022-05-12 DIAGNOSIS — D259 Leiomyoma of uterus, unspecified: Secondary | ICD-10-CM | POA: Diagnosis not present

## 2022-05-12 DIAGNOSIS — Z880 Allergy status to penicillin: Secondary | ICD-10-CM | POA: Diagnosis not present

## 2022-05-12 DIAGNOSIS — R1031 Right lower quadrant pain: Secondary | ICD-10-CM | POA: Diagnosis not present

## 2022-05-12 DIAGNOSIS — R10817 Generalized abdominal tenderness: Secondary | ICD-10-CM | POA: Diagnosis not present

## 2022-05-12 DIAGNOSIS — K5730 Diverticulosis of large intestine without perforation or abscess without bleeding: Secondary | ICD-10-CM | POA: Diagnosis not present

## 2022-05-13 ENCOUNTER — Inpatient Hospital Stay: Admission: RE | Admit: 2022-05-13 | Payer: BC Managed Care – PPO | Source: Ambulatory Visit

## 2022-05-13 ENCOUNTER — Telehealth: Payer: Self-pay | Admitting: Family Medicine

## 2022-05-13 ENCOUNTER — Ambulatory Visit
Admission: RE | Admit: 2022-05-13 | Discharge: 2022-05-13 | Disposition: A | Discharge status: Home / Self Care | Payer: BC Managed Care – PPO | Source: Ambulatory Visit | Attending: Family Medicine | Admitting: Family Medicine

## 2022-05-13 DIAGNOSIS — Z1231 Encounter for screening mammogram for malignant neoplasm of breast: Secondary | ICD-10-CM

## 2022-05-13 NOTE — Telephone Encounter (Signed)
Pt aware we can't do a note unless she is seen in office and evaluated and pt voiced understanding.

## 2022-05-14 DIAGNOSIS — Z880 Allergy status to penicillin: Secondary | ICD-10-CM | POA: Diagnosis not present

## 2022-05-14 DIAGNOSIS — K219 Gastro-esophageal reflux disease without esophagitis: Secondary | ICD-10-CM | POA: Diagnosis not present

## 2022-05-14 DIAGNOSIS — M79605 Pain in left leg: Secondary | ICD-10-CM | POA: Diagnosis not present

## 2022-05-14 DIAGNOSIS — R109 Unspecified abdominal pain: Secondary | ICD-10-CM | POA: Diagnosis not present

## 2022-05-14 DIAGNOSIS — M4316 Spondylolisthesis, lumbar region: Secondary | ICD-10-CM | POA: Diagnosis not present

## 2022-05-14 DIAGNOSIS — F17210 Nicotine dependence, cigarettes, uncomplicated: Secondary | ICD-10-CM | POA: Diagnosis not present

## 2022-05-14 DIAGNOSIS — M545 Low back pain, unspecified: Secondary | ICD-10-CM | POA: Diagnosis not present

## 2022-05-14 DIAGNOSIS — I10 Essential (primary) hypertension: Secondary | ICD-10-CM | POA: Diagnosis not present

## 2022-05-14 DIAGNOSIS — R1030 Lower abdominal pain, unspecified: Secondary | ICD-10-CM | POA: Diagnosis not present

## 2022-05-14 DIAGNOSIS — M25552 Pain in left hip: Secondary | ICD-10-CM | POA: Diagnosis not present

## 2022-05-18 ENCOUNTER — Other Ambulatory Visit (HOSPITAL_COMMUNITY)
Admission: RE | Admit: 2022-05-18 | Discharge: 2022-05-18 | Disposition: A | Discharge status: Home / Self Care | Payer: BC Managed Care – PPO | Source: Ambulatory Visit | Attending: Obstetrics & Gynecology | Admitting: Obstetrics & Gynecology

## 2022-05-18 ENCOUNTER — Encounter (HOSPITAL_COMMUNITY): Payer: Self-pay | Admitting: *Deleted

## 2022-05-18 ENCOUNTER — Observation Stay (HOSPITAL_COMMUNITY)
Admission: EM | Admit: 2022-05-18 | Discharge: 2022-05-19 | Disposition: A | Discharge status: Home / Self Care | Payer: BC Managed Care – PPO | Attending: Family Medicine | Admitting: Family Medicine

## 2022-05-18 ENCOUNTER — Emergency Department (HOSPITAL_COMMUNITY): Payer: BC Managed Care – PPO

## 2022-05-18 ENCOUNTER — Encounter: Payer: Self-pay | Admitting: Obstetrics & Gynecology

## 2022-05-18 ENCOUNTER — Ambulatory Visit: Payer: BC Managed Care – PPO | Admitting: Family Medicine

## 2022-05-18 ENCOUNTER — Other Ambulatory Visit: Payer: Self-pay

## 2022-05-18 ENCOUNTER — Encounter: Payer: Self-pay | Admitting: Family Medicine

## 2022-05-18 ENCOUNTER — Ambulatory Visit: Payer: BC Managed Care – PPO | Admitting: Obstetrics & Gynecology

## 2022-05-18 ENCOUNTER — Observation Stay (HOSPITAL_COMMUNITY): Payer: BC Managed Care – PPO

## 2022-05-18 VITALS — BP 164/81 | HR 67 | Temp 98.1°F | Ht 64.0 in | Wt 184.0 lb

## 2022-05-18 VITALS — BP 183/95 | HR 62 | Ht 64.0 in

## 2022-05-18 DIAGNOSIS — H9312 Tinnitus, left ear: Secondary | ICD-10-CM | POA: Diagnosis not present

## 2022-05-18 DIAGNOSIS — R102 Pelvic and perineal pain: Secondary | ICD-10-CM

## 2022-05-18 DIAGNOSIS — I700 Atherosclerosis of aorta: Secondary | ICD-10-CM

## 2022-05-18 DIAGNOSIS — Z7982 Long term (current) use of aspirin: Secondary | ICD-10-CM | POA: Insufficient documentation

## 2022-05-18 DIAGNOSIS — F17210 Nicotine dependence, cigarettes, uncomplicated: Secondary | ICD-10-CM | POA: Diagnosis not present

## 2022-05-18 DIAGNOSIS — M549 Dorsalgia, unspecified: Secondary | ICD-10-CM | POA: Diagnosis present

## 2022-05-18 DIAGNOSIS — J45909 Unspecified asthma, uncomplicated: Secondary | ICD-10-CM | POA: Insufficient documentation

## 2022-05-18 DIAGNOSIS — M79605 Pain in left leg: Secondary | ICD-10-CM

## 2022-05-18 DIAGNOSIS — I10 Essential (primary) hypertension: Secondary | ICD-10-CM

## 2022-05-18 DIAGNOSIS — N841 Polyp of cervix uteri: Secondary | ICD-10-CM | POA: Insufficient documentation

## 2022-05-18 DIAGNOSIS — M5442 Lumbago with sciatica, left side: Secondary | ICD-10-CM | POA: Diagnosis not present

## 2022-05-18 DIAGNOSIS — R531 Weakness: Secondary | ICD-10-CM

## 2022-05-18 DIAGNOSIS — Z79899 Other long term (current) drug therapy: Secondary | ICD-10-CM | POA: Diagnosis not present

## 2022-05-18 DIAGNOSIS — R7303 Prediabetes: Secondary | ICD-10-CM | POA: Diagnosis not present

## 2022-05-18 DIAGNOSIS — I6523 Occlusion and stenosis of bilateral carotid arteries: Secondary | ICD-10-CM | POA: Diagnosis not present

## 2022-05-18 DIAGNOSIS — E782 Mixed hyperlipidemia: Secondary | ICD-10-CM | POA: Diagnosis present

## 2022-05-18 DIAGNOSIS — F419 Anxiety disorder, unspecified: Secondary | ICD-10-CM

## 2022-05-18 DIAGNOSIS — F320 Major depressive disorder, single episode, mild: Secondary | ICD-10-CM

## 2022-05-18 DIAGNOSIS — R200 Anesthesia of skin: Secondary | ICD-10-CM

## 2022-05-18 DIAGNOSIS — I639 Cerebral infarction, unspecified: Secondary | ICD-10-CM | POA: Diagnosis not present

## 2022-05-18 DIAGNOSIS — M25552 Pain in left hip: Secondary | ICD-10-CM

## 2022-05-18 DIAGNOSIS — G629 Polyneuropathy, unspecified: Secondary | ICD-10-CM

## 2022-05-18 DIAGNOSIS — R29898 Other symptoms and signs involving the musculoskeletal system: Secondary | ICD-10-CM | POA: Diagnosis not present

## 2022-05-18 DIAGNOSIS — R202 Paresthesia of skin: Secondary | ICD-10-CM

## 2022-05-18 LAB — DIFFERENTIAL
Abs Immature Granulocytes: 0.03 10*3/uL (ref 0.00–0.07)
Basophils Absolute: 0.1 10*3/uL (ref 0.0–0.1)
Basophils Relative: 0 %
Eosinophils Absolute: 0.3 10*3/uL (ref 0.0–0.5)
Eosinophils Relative: 2 %
Immature Granulocytes: 0 %
Lymphocytes Relative: 23 %
Lymphs Abs: 2.8 10*3/uL (ref 0.7–4.0)
Monocytes Absolute: 0.6 10*3/uL (ref 0.1–1.0)
Monocytes Relative: 5 %
Neutro Abs: 8.5 10*3/uL — ABNORMAL HIGH (ref 1.7–7.7)
Neutrophils Relative %: 70 %

## 2022-05-18 LAB — URINALYSIS, ROUTINE W REFLEX MICROSCOPIC
Bilirubin Urine: NEGATIVE
Glucose, UA: NEGATIVE mg/dL
Ketones, ur: NEGATIVE mg/dL
Nitrite: NEGATIVE
Protein, ur: 30 mg/dL — AB
Specific Gravity, Urine: 1.027 (ref 1.005–1.030)
pH: 5 (ref 5.0–8.0)

## 2022-05-18 LAB — CBG MONITORING, ED: Glucose-Capillary: 103 mg/dL — ABNORMAL HIGH (ref 70–99)

## 2022-05-18 LAB — COMPREHENSIVE METABOLIC PANEL
ALT: 62 U/L — ABNORMAL HIGH (ref 0–44)
AST: 65 U/L — ABNORMAL HIGH (ref 15–41)
Albumin: 4.3 g/dL (ref 3.5–5.0)
Alkaline Phosphatase: 77 U/L (ref 38–126)
Anion gap: 10 (ref 5–15)
BUN: 12 mg/dL (ref 6–20)
CO2: 22 mmol/L (ref 22–32)
Calcium: 9.6 mg/dL (ref 8.9–10.3)
Chloride: 105 mmol/L (ref 98–111)
Creatinine, Ser: 0.72 mg/dL (ref 0.44–1.00)
GFR, Estimated: >60 mL/min (ref >60)
Glucose, Bld: 120 mg/dL — ABNORMAL HIGH (ref 70–99)
Potassium: 3.9 mmol/L (ref 3.5–5.1)
Sodium: 137 mmol/L (ref 135–145)
Total Bilirubin: 0.9 mg/dL (ref 0.3–1.2)
Total Protein: 7.7 g/dL (ref 6.5–8.1)

## 2022-05-18 LAB — CBC
HCT: 41.8 % (ref 36.0–46.0)
Hemoglobin: 14.6 g/dL (ref 12.0–15.0)
MCH: 31.9 pg (ref 26.0–34.0)
MCHC: 34.9 g/dL (ref 30.0–36.0)
MCV: 91.3 fL (ref 80.0–100.0)
Platelets: 369 10*3/uL (ref 150–400)
RBC: 4.58 MIL/uL (ref 3.87–5.11)
RDW: 13.4 % (ref 11.5–15.5)
WBC: 12.1 10*3/uL — ABNORMAL HIGH (ref 4.0–10.5)
nRBC: 0 % (ref 0.0–0.2)

## 2022-05-18 LAB — ETHANOL: Alcohol, Ethyl (B): <10 mg/dL (ref <10)

## 2022-05-18 LAB — RAPID URINE DRUG SCREEN, HOSP PERFORMED
Amphetamines: NOT DETECTED
Barbiturates: NOT DETECTED
Benzodiazepines: NOT DETECTED
Cocaine: NOT DETECTED
Opiates: POSITIVE — AB
Tetrahydrocannabinol: NOT DETECTED

## 2022-05-18 LAB — PROTIME-INR
INR: 1 (ref 0.8–1.2)
Prothrombin Time: 13.1 seconds (ref 11.4–15.2)

## 2022-05-18 LAB — APTT: aPTT: 28 seconds (ref 24–36)

## 2022-05-18 MED ORDER — PREDNISONE 20 MG PO TABS
40.0000 mg | ORAL_TABLET | Freq: Once | ORAL | Status: AC
  Administered 2022-05-18: 40 mg via ORAL
  Filled 2022-05-18: qty 2

## 2022-05-18 MED ORDER — STROKE: EARLY STAGES OF RECOVERY BOOK: Freq: Once | Status: AC

## 2022-05-18 MED ORDER — CITALOPRAM HYDROBROMIDE 20 MG PO TABS
20.0000 mg | ORAL_TABLET | Freq: Two times a day (BID) | ORAL | Status: DC
  Administered 2022-05-18 – 2022-05-19 (×2): 20 mg via ORAL
  Filled 2022-05-18 (×2): qty 1

## 2022-05-18 MED ORDER — IOHEXOL 350 MG/ML SOLN
75.0000 mL | Freq: Once | INTRAVENOUS | Status: AC | PRN
  Administered 2022-05-18: 75 mL via INTRAVENOUS

## 2022-05-18 MED ORDER — ENOXAPARIN SODIUM 40 MG/0.4ML IJ SOSY
40.0000 mg | PREFILLED_SYRINGE | INTRAMUSCULAR | Status: DC
  Administered 2022-05-18: 40 mg via SUBCUTANEOUS
  Filled 2022-05-18: qty 0.4

## 2022-05-18 MED ORDER — FLUTICASONE FUROATE-VILANTEROL 100-25 MCG/ACT IN AEPB
1.0000 | INHALATION_SPRAY | Freq: Every day | RESPIRATORY_TRACT | Status: DC
  Administered 2022-05-19: 1 via RESPIRATORY_TRACT
  Filled 2022-05-18: qty 28

## 2022-05-18 MED ORDER — CYCLOBENZAPRINE HCL 10 MG PO TABS
10.0000 mg | ORAL_TABLET | Freq: Once | ORAL | Status: AC
  Administered 2022-05-18: 10 mg via ORAL
  Filled 2022-05-18: qty 1

## 2022-05-18 MED ORDER — ASPIRIN 81 MG PO TBEC
81.0000 mg | DELAYED_RELEASE_TABLET | Freq: Every day | ORAL | Status: DC
  Administered 2022-05-19: 81 mg via ORAL
  Filled 2022-05-18: qty 1

## 2022-05-18 MED ORDER — EZETIMIBE 10 MG PO TABS
10.0000 mg | ORAL_TABLET | Freq: Every day | ORAL | Status: DC
  Administered 2022-05-19: 10 mg via ORAL
  Filled 2022-05-18: qty 1

## 2022-05-18 MED ORDER — BUSPIRONE HCL 5 MG PO TABS
5.0000 mg | ORAL_TABLET | Freq: Two times a day (BID) | ORAL | Status: DC
  Administered 2022-05-18 – 2022-05-19 (×2): 5 mg via ORAL
  Filled 2022-05-18 (×2): qty 1

## 2022-05-18 MED ORDER — ORAL CARE MOUTH RINSE: 15.0000 mL | OROMUCOSAL | Status: DC | PRN

## 2022-05-18 MED ORDER — MORPHINE SULFATE (PF) 2 MG/ML IV SOLN
2.0000 mg | Freq: Once | INTRAVENOUS | Status: AC
  Administered 2022-05-18: 2 mg via INTRAVENOUS
  Filled 2022-05-18: qty 1

## 2022-05-18 MED ORDER — ACETAMINOPHEN 650 MG RE SUPP: 650.0000 mg | RECTAL | Status: DC | PRN

## 2022-05-18 MED ORDER — SENNOSIDES-DOCUSATE SODIUM 8.6-50 MG PO TABS: 1.0000 | ORAL_TABLET | Freq: Every evening | ORAL | Status: DC | PRN

## 2022-05-18 MED ORDER — ASPIRIN 325 MG PO TABS
325.0000 mg | ORAL_TABLET | Freq: Once | ORAL | Status: AC
  Administered 2022-05-18: 325 mg via ORAL
  Filled 2022-05-18: qty 1

## 2022-05-18 MED ORDER — ACETAMINOPHEN 160 MG/5ML PO SOLN: 650.0000 mg | ORAL | Status: DC | PRN

## 2022-05-18 MED ORDER — UMECLIDINIUM BROMIDE 62.5 MCG/ACT IN AEPB
1.0000 | INHALATION_SPRAY | Freq: Every day | RESPIRATORY_TRACT | Status: DC
  Administered 2022-05-19: 1 via RESPIRATORY_TRACT
  Filled 2022-05-18: qty 7

## 2022-05-18 MED ORDER — ACETAMINOPHEN 325 MG PO TABS: 650.0000 mg | ORAL_TABLET | ORAL | Status: DC | PRN

## 2022-05-18 MED ORDER — LORAZEPAM 2 MG/ML IJ SOLN
0.5000 mg | Freq: Once | INTRAMUSCULAR | Status: AC
  Administered 2022-05-19: 0.5 mg via INTRAVENOUS
  Filled 2022-05-18: qty 1

## 2022-05-18 MED ORDER — HYDROCODONE-ACETAMINOPHEN 5-325 MG PO TABS
1.0000 | ORAL_TABLET | Freq: Four times a day (QID) | ORAL | Status: DC | PRN
  Administered 2022-05-19 (×2): 1 via ORAL
  Filled 2022-05-18 (×2): qty 1

## 2022-05-18 MED ORDER — ATORVASTATIN CALCIUM 40 MG PO TABS
80.0000 mg | ORAL_TABLET | Freq: Every day | ORAL | Status: DC
  Administered 2022-05-19: 80 mg via ORAL
  Filled 2022-05-18: qty 2

## 2022-05-18 MED ORDER — NICOTINE 7 MG/24HR TD PT24
7.0000 mg | MEDICATED_PATCH | Freq: Every day | TRANSDERMAL | Status: DC
  Administered 2022-05-18 – 2022-05-19 (×2): 7 mg via TRANSDERMAL
  Filled 2022-05-18 (×2): qty 1

## 2022-05-18 NOTE — Assessment & Plan Note (Signed)
Ongoing tobacco abuse.  7 cigarettes daily. - Counseled to quit smoking. -Nicotine patch

## 2022-05-18 NOTE — Assessment & Plan Note (Addendum)
Acute onset back pain of 1 week.  No improvement in pain with NSAIDs.  Recent workup lumbar MRI W Wo contrast- 05/14/2022- Small left foraminal to extraforaminal disc protrusion at L4-5,  closely approximating and potentially affecting the exiting left L4  nerve root.  2. Mild to moderate left greater than right facet hypertrophy at  L4-5 with associated marrow edema. Finding could contribute to lower  back pain and referred symptoms.  -She also had pelvic MRI 4/25, and CT abdominal pelvis 4/23 but without acute abnormality. -MRI findings would explain her back pain with left sciatica consider follow-up with Ortho-spine, if steroid injection will help. -IV morphine 2 mg x 1, continue with hydrocodone -Muscle relaxants Flexeril x 1 -Trial of prednisone 40 mg x 1 -PT recommending CIR - awaiting OT eval and CIR eval

## 2022-05-18 NOTE — Assessment & Plan Note (Signed)
Systolic 140s.  Allowing for permissive hypertension in the setting of possible stroke. -Hold olmesartan and HCTZ 2012.5 daily. - PRN hydralazine 10 mg for systolic greater than 220/110

## 2022-05-18 NOTE — Assessment & Plan Note (Addendum)
Left upper and lower extremity weakness and numbness- persistent since onset at 1:30 PM. No aphasia.  Evaluated by teleneurologist Dr. Earnest Conroy note, slight droop of left noted.  Called, head CT negative for acute abnormality.  Patient and her husband declined TNK.  On aspirin. -Stroke workup to include MRI brain, CTA head and neck, TTE, telemetry - HgbA1c, Lipid panel -PT, OT, Speech eval -325 mg aspirin x 1, continue 81 mg daily  -Resume atorvastatin 80 mg daily -Allow for permissive hypertension -Swallow evaluation

## 2022-05-18 NOTE — Progress Notes (Signed)
Telestroke RN code stroke note  1438- code stroke activation Pt has already been evaluated by EDP and in CT 1445- Pt returned from CT 1453- Dr Otelia Limes on camera for stroke assessment, LKW 1330, MRS 1  1538- after long discussion w/ pt and her husband regarding TNK, they declined medication at this time.

## 2022-05-18 NOTE — Assessment & Plan Note (Signed)
Resume BuSpar, Celexa.

## 2022-05-18 NOTE — Progress Notes (Addendum)
Acute Office Visit  Subjective:     Patient ID: Haley Nielsen, female    DOB: May 26, 1965, 57 y.o.   MRN: [id number]  Chief Complaint  Patient presents with   Hip Pain    Hip Pain  The incident occurred 5 to 7 days ago. There was no injury mechanism. The pain is present in the left hip. The quality of the pain is described as aching, shooting and stabbing. The pain is severe. The pain has been Worsening since onset. Associated symptoms comments: Pain with weight bearing. Numbness/tingling down back of left leg and in feet. . She reports no foreign bodies present. The symptoms are aggravated by movement, weight bearing and palpation. She has tried elevation, ice, heat, non-weight bearing and rest for the symptoms.   She was seen in the ER when the pain began and had a negative MRI of her pelvis and lumbar MRI that showed some degenrative changes along with some compression of L4 nerve root.   She just now started having ringing in the left ear and numbness in the arm as well on the way over to her visit today. She denies confusion, changes in speech, changes in vision, chest pain, or shortness of breath.   ROS As per HPI.      Objective:    BP (!) 164/81   Pulse 67   Temp 98.1 F (36.7 C) (Temporal)   Ht 5\' 4"  (1.626 m)   Wt 184 lb (83.5 kg)   SpO2 97%   BMI 31.58 kg/m    Physical Exam Vitals and nursing note reviewed.  Constitutional:      General: She is not in acute distress.    Appearance: Normal appearance. She is not ill-appearing, toxic-appearing or diaphoretic.  HENT:     Mouth/Throat:     Mouth: Mucous membranes are moist.     Pharynx: Oropharynx is clear.  Eyes:     Extraocular Movements: Extraocular movements intact.     Pupils: Pupils are equal, round, and reactive to light.  Cardiovascular:     Rate and Rhythm: Normal rate and regular rhythm.     Heart sounds: Normal heart sounds. No murmur heard. Pulmonary:     Effort: Pulmonary effort is normal. No  respiratory distress.     Breath sounds: Normal breath sounds.  Musculoskeletal:     Cervical back: Neck supple. No rigidity.  Neurological:     Mental Status: She is alert and oriented to person, place, and time.     Cranial Nerves: Facial asymmetry (slight droop to left) present.     Motor: Weakness (left UE and LE) present.     Gait: Gait abnormal (in wheelchair).  Psychiatric:        Mood and Affect: Mood normal.        Behavior: Behavior normal.     No results found for any visits on 05/18/22.      Assessment & Plan:   [name] was seen today for hip pain.  Diagnoses and all orders for this visit:  Acute left-sided weakness  Numbness and tingling of left arm and leg  Tinnitus of left ear  Left hip pain  Left leg pain  Reviewed ER notes, labs, and imaging reports. Left leg numbness/tingling, hip pain and weakness has been ongoing for 1 week, worsening. However, she presents today with acute numbness and tingling of left arm with left ear tinnitus about 30 mins ago with left arm weakness present on exam. Slight droop of  left noted. Discussed new left UE findings concerning for acute stroke and need for emergent evaluation in ER for this. Her husband will take her directly to the ER now for evaluation in their personal car.    Gabriel Earing, FNP

## 2022-05-18 NOTE — ED Notes (Signed)
EDP in triage to assess pt 

## 2022-05-18 NOTE — Consult Note (Signed)
[facility] TeleNeurology Consult Services    Date of Service:  05/18/2022     Metrics: Last Known Well: 1:30 PM Symptoms: As per HPI.  Patient is a candidate for thrombolytic.   Location of the provider: [facility]  Location of the patient: [facility] Pre-Morbid Modified Rankin Scale: 0 Time Code Stroke Page received:  2:41 PM Time neurologist arrived:  2:53 PM Time NIHSS completed: 3:20 PM    This consult was provided via telemedicine with 2-way video and audio communication. The patient/family was informed that care would be provided in this way and agreed to receive care in this manner.   ED Physician notified of diagnostic impression and management plan at: 3:39 PM   Assessment: 57 year old female presenting with multiple acute neurological complaints.  - Exam reveals mild left sided weakness, mild dysphasia and one missed orientation question. Partial visual field cut that best localizes to the left retina is also noted. No facial droop. NIHSS 5.  - CT head: Normal head CT. Aspects is 10. - Overall presentation is equivocal regarding underlying etiology.  A small stroke is possible. Also on the DDx are stress-related symptoms or conversion disorder. Secondary gain is also on the DDx.  - The risks/benefits of TNK were discussed with the patient and her husband who declined treatment. All questions answered.  - Overall presentation is not consistent with LVO.      Recommendations: - Stroke work up to include MRI brain, CTA of head and neck, TTE and cardiac telemetry.  - HgbA1c, fasting lipid panel - PT consult, OT consult, Speech consult - Continue ASA and atorvastatin - Permissive HTN x 24 hours.  - Risk factor modification - Frequent neuro checks - NPO until passes stroke swallow screen      ------------------------------------------------------------------------------   History of Present Illness: The patient is a 57 year old  female who presents to the [facility] with new onset of left sided numbness and weakness. Her left sided numbness and tingling began at 1:30 PM while at her Ob/Gyn's exam. The patient also began to experience tinnitus, presyncopal dizziness and seeing spots in the entirety of her vision with both eyes. Left sided weakness was confirmed by exam there and she was sent to the ED for further evaluation.   Per her Obstetrician/Gynecologist note from her office visit this afternoon: "Left leg numbness/tingling, hip pain and weakness has been ongoing for 1 week, worsening. However, she presents today with acute numbness and tingling of left arm with left ear tinnitus about 30 mins ago with left arm weakness present on exam. Slight droop of left noted. Discussed new left UE findings concerning for acute stroke and need for emergent evaluation in ER for this. Her husband will take her directly to the ER now for evaluation in their personal car."   Although the above Ob/Gyn note documents symptoms for the past week, the patient clarifies that the weakness and numbness were first noticed today at 1:30 PM. She also endorses not feeling like herself for the past 2 days.    Past Medical History: Past Medical History:  Diagnosis Date   Allergic rhinitis    Allergy    Blurry vision    Bronchospasm    Chest pain    Dizziness    Elevated CK-MB level    GERD (gastroesophageal reflux disease)    Hyperlipidemia    Hypertension    Neuropathy 05/15/2021   Syncope    Syncope  Tobacco abuse    Weakness       Past Surgical History: Past Surgical History:  Procedure Laterality Date   TUBAL LIGATION         Medications:  No current facility-administered medications on file prior to encounter.   Current Outpatient Medications on File Prior to Encounter  Medication Sig Dispense Refill   aspirin EC 81 MG tablet Take 1 tablet (81 mg total) by mouth daily. Swallow whole. 90 tablet 3   atorvastatin (LIPITOR) 80 MG  tablet Take 1 tablet (80 mg total) by mouth daily. Keep on file 90 tablet 3   busPIRone (BUSPAR) 5 MG tablet Take 1 tablet (5 mg total) by mouth 2 (two) times daily. 180 tablet 1   citalopram (CELEXA) 20 MG tablet Take 1 tablet (20 mg total) by mouth daily. 90 tablet 3   EQ ALL DAY ALLERGY RELIEF 10 MG tablet Take 1 tablet by mouth once daily 90 tablet 0   ezetimibe (ZETIA) 10 MG tablet Take 1 tablet (10 mg total) by mouth daily. 90 tablet 3   fluticasone (FLONASE) 50 MCG/ACT nasal spray Place 2 sprays into both nostrils daily. 16 g 6   Fluticasone-Umeclidin-Vilant (TRELEGY ELLIPTA) 100-62.5-25 MCG/ACT AEPB Inhale 1 puff into the lungs daily. 1 each 11   lidocaine (LIDODERM) 5 % Place 1 patch onto the skin daily. Remove & Discard patch within 12 hours or as directed by MD 30 patch 0   methocarbamol (ROBAXIN) 500 MG tablet Take 1 tablet (500 mg total) by mouth every 8 (eight) hours as needed for muscle spasms. 180 tablet 0   olmesartan-hydrochlorothiazide (BENICAR HCT) 20-12.5 MG tablet Take 1 tablet by mouth daily. 90 tablet 3   Omega-3 Fatty Acids (FISH OIL) 1000 MG CAPS Take by mouth.     omeprazole (PRILOSEC) 20 MG capsule Take 1 capsule by mouth once daily 90 capsule 0   VENTOLIN HFA 108 (90 Base) MCG/ACT inhaler INHALE 1 TO 2 PUFFS BY MOUTH EVERY 4 HOURS AS NEEDED FOR WHEEZING OR SHORTNESS OF BREATH 18 g 2         Social History: Drug Use: None.    Family History:  Reviewed in Epic   ROS: As per HPI    Anticoagulant use:  No   Antiplatelet use: ASA   Examination:    BP 149/83, HR 73, RR 18, afebrile   1A: Level of Consciousness - 0 1B: Ask Month and Age - 1 "May" 1C: Blink Eyes & Squeeze Hands - 0 2: Test Horizontal Extraocular Movements - 0 3: Test Visual Fields - 1 (crescentic visual field defect in temporal field of left eye only) 4: Test Facial Palsy (Use Grimace if Obtunded) - 0 5A: Test Left Arm Motor Drift - 1 5B: Test Right Arm Motor Drift - 0 6A: Test Left Leg  Motor Drift - 1 6B: Test Right Leg Motor Drift - 0 7: Test Limb Ataxia (FNF/Heel-Shin) - 0 8: Test Sensation -  0 9: Test Language/Aphasia - 1 10: Test Dysarthria - Severe Dysarthria: 0 11: Test Extinction/Inattention - Extinction to bilateral simultaneous stimulation 0   NIHSS Score: 5     Patient/Family was informed the Neurology Consult would occur via TeleHealth consult by way of interactive audio and video telecommunications and consented to receiving care in this manner.   Patient is being evaluated for possible acute neurologic impairment and high pretest probability of imminent or life-threatening deterioration. I spent total of 40 minutes providing care to this patient,  including time for face to face visit via telemedicine, review of medical records, imaging studies and discussion of findings with providers, the patient and/or family.   Electronically signed: Dr. Caryl Pina

## 2022-05-18 NOTE — H&P (Addendum)
History and Physical    Haley Nielsen ZOX:[id number] DOB: [birth year] DOA: 05/18/2022  PCP: Gabriel Earing, FNP   Patient coming from: Home  I have personally briefly reviewed patient's old medical records in [facility]  Chief Complaint: Left sided weakness  HPI: Haley Nielsen is a 57 y.o. female with medical history significant for hypertension, asthma, ongoing tobacco abuse, anxiety. Patient was referred to the ED from outpatient providers office with complaints of sudden onset left-sided upper and lower extremity numbness and weakness.  Patient's went to see her gynecologist earlier today, subsequently went to see an outpatient provider when these symptoms started at about 1:30 PM.  She reports some changes to her vision in both eyes and mild dizziness.  No change in speech.  She is unaware of any facial asymmetry.  Spouse is at bedside. No history of strokes.  She smokes about 7 cigarettes daily.  Takes aspirin daily. Symptoms have persisted in severity since onset.  Also reports onset of mid low back pain that started about a week ago with radiation down to her left hip and leg.  Pain is constant in intensity with or without ambulation.  No history of back pain.   ED Course: Blood pressure 149/83.  Heart rate 73.  Temperature 98.  Code stroke called.  Head CT negative for acute abnormality.  Evaluated by teleneurologist Dr. Otelia Limes.  Patient declined TNK.  Admission for stroke workup requested.  Review of Systems: As per HPI all other systems reviewed and negative.  Past Medical History:  Diagnosis Date   Allergic rhinitis    Allergy    Blurry vision    Bronchospasm    Chest pain    Dizziness    Elevated CK-MB level    GERD (gastroesophageal reflux disease)    Hyperlipidemia    Hypertension    Neuropathy 05/15/2021   Syncope    Syncope    Tobacco abuse    Weakness     Past Surgical History:  Procedure Laterality Date   TUBAL LIGATION       reports that she  has been smoking cigarettes. She has a 3.75 pack-year smoking history. She has never used smokeless tobacco. She reports that she does not currently use alcohol. She reports that she does not use drugs.  Allergies  Allergen Reactions   Penicillins Hives    Family History  Problem Relation Age of Onset   Colon polyps Mother    Hyperlipidemia Mother    Hypertension Mother    Stroke Father 53   Heart attack Maternal Grandmother    Cancer Paternal Grandmother        cervical cancer   Colon cancer Neg Hx    Esophageal cancer Neg Hx    Rectal cancer Neg Hx    Stomach cancer Neg Hx     Prior to Admission medications   Medication Sig Start Date End Date Taking? Authorizing Provider  aspirin EC 81 MG tablet Take 1 tablet (81 mg total) by mouth daily. Swallow whole. 05/07/21   Gabriel Earing, FNP  atorvastatin (LIPITOR) 80 MG tablet Take 1 tablet (80 mg total) by mouth daily. Keep on file 11/05/21   Gabriel Earing, FNP  busPIRone (BUSPAR) 5 MG tablet Take 1 tablet (5 mg total) by mouth 2 (two) times daily. 04/02/22   Gabriel Earing, FNP  citalopram (CELEXA) 20 MG tablet Take 1 tablet (20 mg total) by mouth daily. 05/01/22   Gabriel Earing, FNP  EQ  ALL DAY ALLERGY RELIEF 10 MG tablet Take 1 tablet by mouth once daily 04/23/22   Gabriel Earing, FNP  ezetimibe (ZETIA) 10 MG tablet Take 1 tablet (10 mg total) by mouth daily. 04/06/22   Gabriel Earing, FNP  fluticasone (FLONASE) 50 MCG/ACT nasal spray Place 2 sprays into both nostrils daily. 05/07/21   Gabriel Earing, FNP  Fluticasone-Umeclidin-Vilant (TRELEGY ELLIPTA) 100-62.5-25 MCG/ACT AEPB Inhale 1 puff into the lungs daily. 08/04/21   Gabriel Earing, FNP  lidocaine (LIDODERM) 5 % Place 1 patch onto the skin daily. Remove & Discard patch within 12 hours or as directed by MD 11/05/21   Gabriel Earing, FNP  methocarbamol (ROBAXIN) 500 MG tablet Take 1 tablet (500 mg total) by mouth every 8 (eight) hours as needed for muscle  spasms. 11/05/21   Gabriel Earing, FNP  olmesartan-hydrochlorothiazide (BENICAR HCT) 20-12.5 MG tablet Take 1 tablet by mouth daily. 04/02/22   Gabriel Earing, FNP  Omega-3 Fatty Acids (FISH OIL) 1000 MG CAPS Take by mouth.    [provider]  omeprazole (PRILOSEC) 20 MG capsule Take 1 capsule by mouth once daily 04/23/22   Gabriel Earing, FNP  VENTOLIN HFA 108 (90 Base) MCG/ACT inhaler INHALE 1 TO 2 PUFFS BY MOUTH EVERY 4 HOURS AS NEEDED FOR WHEEZING OR SHORTNESS OF BREATH 01/07/22   Gabriel Earing, FNP    Physical Exam: Vitals:   05/18/22 1430 05/18/22 1430  BP:  (!) 149/83  Pulse:  73  Resp:  18  Temp:  98 F (36.7 C)  TempSrc:  Oral  SpO2:  97%  Weight: 83.9 kg   Height: 5\' 4"  (1.626 m)     Constitutional: NAD, calm, comfortable Vitals:   05/18/22 1430 05/18/22 1430  BP:  (!) 149/83  Pulse:  73  Resp:  18  Temp:  98 F (36.7 C)  TempSrc:  Oral  SpO2:  97%  Weight: 83.9 kg   Height: 5\' 4"  (1.626 m)    Eyes: PERRL, lids and conjunctivae normal ENMT: Mucous membranes are moist.  Neck: normal, supple, no masses, no thyromegaly Respiratory: clear to auscultation bilaterally, no wheezing, no crackles. Normal respiratory effort. No accessory muscle use.  Cardiovascular: Regular rate and rhythm, no murmurs / rubs / gallops. No extremity edema.  Extremities warm.  Abdomen: no tenderness, no masses palpated. No hepatosplenomegaly. Bowel sounds positive.  Musculoskeletal: no clubbing / cyanosis. No joint deformity upper and lower extremities.  Tenderness mid low back area and to paraspinal muscles on the left. Skin: no rashes, lesions, ulcers. No induration Neurologic:  Neurological:     Mental Status: She is alert.     GCS: GCS eye subscore is 4. GCS verbal subscore is 5. GCS motor subscore is 6.     Comments: Mental Status:  Alert, oriented, thought content appropriate, able to give a coherent history. Speech fluent without evidence of aphasia. Able to  follow 2 step commands without difficulty.  Cranial Nerves:  III,IV, VI: ptosis not present, extra-ocular motions intact bilaterally  V,VII: smile symmetric, eyebrows raise symmetric, facial light touch sensation equal VIII: hearing grossly normal to voice  Motor:  Normal tone.  5/5 strength to bilateral upper extremity, but slightly less strength on the left compared to right.  Has pain to left lower extremity which may affect strength, but 5/5 strength to bilateral lower extremity, slightly also worse on the left.   Sensory: Sensation subjectively reduced on the left upper and lower extremity.  Cerebellar:  CV: distal pulses palpable throughout    Psychiatric: Normal judgment and insight. Alert and oriented x 3. Normal mood.   Labs on Admission: I have personally reviewed following labs and imaging studies  CBC: Recent Labs  Lab 05/18/22 1503  WBC 12.1*  NEUTROABS 8.5*  HGB 14.6  HCT 41.8  MCV 91.3  PLT 369   Basic Metabolic Panel: Recent Labs  Lab 05/18/22 1503  NA 137  K 3.9  CL 105  CO2 22  GLUCOSE 120*  BUN 12  CREATININE 0.72  CALCIUM 9.6   GFR: Estimated Creatinine Clearance: 81.3 mL/min (by C-G formula based on SCr of 0.72 mg/dL). Liver Function Tests: Recent Labs  Lab 05/18/22 1503  AST 65*  ALT 62*  ALKPHOS 77  BILITOT 0.9  PROT 7.7  ALBUMIN 4.3   Coagulation Profile: Recent Labs  Lab 05/18/22 1503  INR 1.0   CBG: Recent Labs  Lab 05/18/22 1431  GLUCAP 103*    Radiological Exams on Admission: CT HEAD CODE STROKE WO CONTRAST  Result Date: 05/18/2022 CLINICAL DATA:  Code stroke. Neuro deficit, acute, stroke suspected. Left leg and arm weakness. EXAM: CT HEAD WITHOUT CONTRAST TECHNIQUE: Contiguous axial images were obtained from the base of the skull through the vertex without intravenous contrast. RADIATION DOSE REDUCTION: This exam was performed according to the departmental dose-optimization program which includes automated exposure  control, adjustment of the mA and/or kV according to patient size and/or use of iterative reconstruction technique. COMPARISON:  07/02/2009 FINDINGS: Brain: Normal appearance without evidence of old or acute infarction, mass lesion, hemorrhage, hydrocephalus or extra-axial collection. Vascular: No abnormal vascular finding. Skull: Normal Sinuses/Orbits: Clear/normal Other: None ASPECTS ([place]ta Stroke Program Early CT Score) - Ganglionic level infarction (caudate, lentiform nuclei, internal capsule, insula, M1-M3 cortex): 7 - Supraganglionic infarction (M4-M6 cortex): 3 Total score (0-10 with 10 being normal): 10 IMPRESSION: 1. Normal head CT. 2. Aspects is 10. These results were called by telephone at the time of interpretation on 05/18/2022 at 2:51 pm to provider Gloris Manchester , who verbally acknowledged these results. Electronically Signed   By: Paulina Fusi M.D.   On: 05/18/2022 14:52    EKG: Independently reviewed.  Sinus rhythm, rate 67, QTc 446.  No significant change from prior.  Assessment/Plan Principal Problem:   Left-sided weakness Active Problems:   Back pain   Primary hypertension   Mixed hyperlipidemia   Cigarette smoker   Prediabetes   Anxiety   Assessment and Plan: * Left-sided weakness Left upper and lower extremity weakness and numbness- persistent since onset at 1:30 PM. No aphasia.  Evaluated by teleneurologist Dr. Earnest Conroy note, slight droop of left noted.  Called, head CT negative for acute abnormality.  Patient and her husband declined TNK.  On aspirin. -Stroke workup to include MRI brain, CTA head and neck, TTE, telemetry - HgbA1c, Lipid panel -PT, OT, Speech eval -325 mg aspirin x 1, continue 81 mg daily  -Resume atorvastatin 80 mg daily -Allow for permissive hypertension -Swallow evaluation   Back pain Acute onset back pain of 1 week.  No improvement in pain with NSAIDs.  Recent workup lumbar MRI W Wo contrast- 05/14/2022- Small left foraminal to extraforaminal  disc protrusion at L4-5,  closely approximating and potentially affecting the exiting left L4  nerve root.  2. Mild to moderate left greater than right facet hypertrophy at  L4-5 with associated marrow edema. Finding could contribute to lower  back pain and referred symptoms.  -She also had  pelvic MRI 4/25, and CT abdominal pelvis 4/23 but without acute abnormality. -MRI findings would explain her back pain with left sciatica consider follow-up with Ortho-spine, if steroid injection will help. -IV morphine 2 mg x 1, continue with hydrocodone -Muscle relaxants Flexeril x 1 -Trial of prednisone 40 mg x 1  Anxiety Resume BuSpar, Celexa.  Cigarette smoker Ongoing tobacco abuse.  7 cigarettes daily. - Counseled to quit smoking. -Nicotine patch  Primary hypertension Systolic 140s.  Allowing for permissive hypertension in the setting of possible stroke. -Hold olmesartan and HCTZ 2012.5 daily. - PRN hydralazine 10 mg for systolic greater than 220/110   DVT prophylaxis: Lovenox Code Status: FULL-confirmed with patient and spouse at bedside. Family Communication: Spouse at bedside.   Disposition Plan: ~ 1 - 2 days Consults called: Neurology Admission status:  Obs tele   Author: Onnie Boer, MD 05/18/2022 7:02 PM  For on call review [email or web address].

## 2022-05-18 NOTE — ED Notes (Addendum)
Patient transported to CT by meagan, acitivated code stroke verbally by PA [facility]

## 2022-05-18 NOTE — Plan of Care (Signed)

## 2022-05-18 NOTE — ED Triage Notes (Signed)
Pt sent from doctor's office for left sided weakness, pt states started at 1330.  Also with numbness and tingling to left arm.

## 2022-05-18 NOTE — Progress Notes (Signed)
GYN VISIT Patient name: Haley Nielsen MRN [id number]  Date of birth: 02-03-1965 Chief Complaint:   Gynecologic Exam (Has growth on cervix in os)  History of Present Illness:   Haley Nielsen is a 57 y.o. PM female being seen today for consultation for cervical lesion seen on annual exam.  Denies vaginal bleeding, discharge, itching or irritaiton.  Prior to her exam she did not realize that something was abnormal.  She does report pelvic pain- started on Monday.  No nausea/vomiting.  Some constipation.  No diarrhea.   Struggling with considerable left leg/hip pain- seen in ER x 2 and Urgent Care  No LMP recorded. Patient is postmenopausal.     05/18/2022    1:19 PM 05/01/2022   10:19 AM 04/02/2022    8:11 AM 11/05/2021    5:22 PM 08/04/2021    4:22 PM  Depression screen PHQ 2/9  Decreased Interest 2 0 0 1 0  Down, Depressed, Hopeless 2 2 0 1 0  PHQ - 2 Score 4 2 0 2 0  Altered sleeping 2 0 0 0 0  Tired, decreased energy 2 2 3  0 0  Change in appetite 1 0 0 0 0  Feeling bad or failure about yourself  0 0 0 0 0  Trouble concentrating 3 0 0 0 0  Moving slowly or fidgety/restless 0 0 0 0 0  Suicidal thoughts 0 0 0 0 0  PHQ-9 Score 12 4 3 2  0  Difficult doing work/chores Somewhat difficult Not difficult at all Not difficult at all Not difficult at all      Review of Systems:   Pertinent items are noted in HPI Denies fever/chills, dizziness, headaches, visual disturbances, fatigue, shortness of breath, chest pain, vomiting. Pertinent History Reviewed:  Reviewed past medical,surgical, social, obstetrical and family history.  Reviewed problem list, medications and allergies. Physical Assessment:   Vitals:   05/18/22 1128 05/18/22 1149  BP: (!) 186/94 (!) 183/95  Pulse: 82 62  Height: 5\' 4"  (1.626 m)   Body mass index is 31.58 kg/m.       Physical Examination:   General appearance: alert, well appearing, and in no distress  Psych: mood appropriate, normal affect  Skin:  warm & dry   Cardiovascular: normal heart rate noted  Respiratory: normal respiratory effort, no distress  Abdomen: soft, diffuse lower abdominal tenderness, no rebound no guarding  Pelvic: VULVA: normal appearing vulva with no masses, tenderness or lesions, VAGINA: normal appearing vagina with normal color and discharge, no lesions, CERVIX: endocervical polyp size <1cm  Extremities: no edema, limited mobility due to left leg and hip pain  Chaperone: Faith Rogue    CERVICAL POLYPECTOMY NOTE Risks of the biopsy including pain, bleeding, infection, inadequate specimen, and need for additional procedures were discussed. The patient stated understanding and agreed to undergo procedure today.  Chaperone was present during entire procedure. Ring forceps were used to grasp the polypoid lesion and the polypoid lesions was removed by twisting it off its base.  Tissue sent to pathology for analysis.  Small bleeding was noted and hemostasis was achieved using silver nitrate sticks.  The patient tolerated the procedure well.   Assessment & Plan:  1) Cervical polyp Post-procedure instructions were given to the patient. The patient is to call with heavy bleeding, fever greater than 100.4, foul smelling vaginal discharge or other concerns. The patient will be return to clinic in two weeks for discussion of results.  2) pelvic pain -Reviewed MRI no mention  of abnormal ovaries.   -While fibroids were visualized on MRI, and postmenopausal state would not anticipate that fibroids are the source of her pelvic pain -May be due to constipation, should pain persist may consider pelvic ultrasound -Patient has follow-up scheduled with PCP later today  Advised patient that during her afternoon appointment with PCP she should discuss not only her leg and hip pain but also her elevated blood pressure   Return if symptoms worsen or fail to improve.   Myna Hidalgo, DO Attending Obstetrician & Gynecologist,  [facility] for [employer], [facility]

## 2022-05-18 NOTE — ED Provider Notes (Signed)
[place] EMERGENCY DEPARTMENT AT [facility] Provider Note   CSN: [id number] Arrival date & time: 05/18/22  1422  An emergency department physician performed an initial assessment on this suspected stroke patient at 1440.  History  Chief Complaint  Patient presents with   Numbness    Haley Nielsen is a 57 y.o. female.  HPI 57 year old female presents with concern for acute stroke. Code Stroke called about 20 minutes before my shift started.  I was able to initially evaluate the patient after she had had her neuro consultation by teleneurology.  Patient tells me she has been dealing with her left leg and low back hurting for several days.  She went to another ER on 4/25 for evaluation and had an MRI.  However at her doctor's office today she was normal at around 1 PM and then around 130 noticed left arm weakness and numbness.  No facial symptoms.  She feels like she is developing a headache but it is pretty minimal.  No visual complaints or speech symptoms.  Was called a code stroke here in the emergency department.  Neurology has evaluated her and the patient has declined TNK.  Home Medications Prior to Admission medications   Medication Sig Start Date End Date Taking? Authorizing Provider  aspirin EC 81 MG tablet Take 1 tablet (81 mg total) by mouth daily. Swallow whole. 05/07/21   Gabriel Earing, FNP  atorvastatin (LIPITOR) 80 MG tablet Take 1 tablet (80 mg total) by mouth daily. Keep on file 11/05/21   Gabriel Earing, FNP  busPIRone (BUSPAR) 5 MG tablet Take 1 tablet (5 mg total) by mouth 2 (two) times daily. 04/02/22   Gabriel Earing, FNP  citalopram (CELEXA) 20 MG tablet Take 1 tablet (20 mg total) by mouth daily. 05/01/22   Gabriel Earing, FNP  EQ ALL DAY ALLERGY RELIEF 10 MG tablet Take 1 tablet by mouth once daily 04/23/22   Gabriel Earing, FNP  ezetimibe (ZETIA) 10 MG tablet Take 1 tablet (10 mg total) by mouth daily. 04/06/22   Gabriel Earing, FNP   fluticasone (FLONASE) 50 MCG/ACT nasal spray Place 2 sprays into both nostrils daily. 05/07/21   Gabriel Earing, FNP  Fluticasone-Umeclidin-Vilant (TRELEGY ELLIPTA) 100-62.5-25 MCG/ACT AEPB Inhale 1 puff into the lungs daily. 08/04/21   Gabriel Earing, FNP  lidocaine (LIDODERM) 5 % Place 1 patch onto the skin daily. Remove & Discard patch within 12 hours or as directed by MD 11/05/21   Gabriel Earing, FNP  methocarbamol (ROBAXIN) 500 MG tablet Take 1 tablet (500 mg total) by mouth every 8 (eight) hours as needed for muscle spasms. 11/05/21   Gabriel Earing, FNP  olmesartan-hydrochlorothiazide (BENICAR HCT) 20-12.5 MG tablet Take 1 tablet by mouth daily. 04/02/22   Gabriel Earing, FNP  Omega-3 Fatty Acids (FISH OIL) 1000 MG CAPS Take by mouth.    [provider]  omeprazole (PRILOSEC) 20 MG capsule Take 1 capsule by mouth once daily 04/23/22   Gabriel Earing, FNP  VENTOLIN HFA 108 (90 Base) MCG/ACT inhaler INHALE 1 TO 2 PUFFS BY MOUTH EVERY 4 HOURS AS NEEDED FOR WHEEZING OR SHORTNESS OF BREATH 01/07/22   Gabriel Earing, FNP      Allergies    Penicillins    Review of Systems   Review of Systems  Musculoskeletal:  Positive for back pain.  Neurological:  Positive for weakness, numbness and headaches. Negative for speech difficulty.  Physical Exam Updated Vital Signs BP (!) 149/83 (BP Location: Right Arm)   Pulse 73   Temp 98 F (36.7 C) (Oral)   Resp 18   Ht 5\' 4"  (1.626 m)   Wt 83.9 kg   SpO2 97%   BMI 31.76 kg/m  Physical Exam Vitals and nursing note reviewed.  Constitutional:      General: She is not in acute distress.    Appearance: She is well-developed. She is not ill-appearing or diaphoretic.  HENT:     Head: Normocephalic and atraumatic.  Eyes:     Extraocular Movements: Extraocular movements intact.     Pupils: Pupils are equal, round, and reactive to light.  Cardiovascular:     Rate and Rhythm: Normal rate and regular rhythm.     Pulses:           Radial pulses are 2+ on the left side.     Heart sounds: Normal heart sounds.  Pulmonary:     Effort: Pulmonary effort is normal.     Breath sounds: Normal breath sounds.  Abdominal:     General: There is no distension.  Skin:    General: Skin is warm and dry.  Neurological:     Mental Status: She is alert.     Comments: CN 3-12 grossly intact. 5/5 strength in right upper and right lower extremities. Weakness appreciated in left upper extremity on resistance testing, though she can freely move it against gravity. Feels subjectively decreased sensation in left arm. Left leg is mildly weak compared to right, also with subjectively decreased sensation     ED Results / Procedures / Treatments   Labs (all labs ordered are listed, but only abnormal results are displayed) Labs Reviewed  CBC - Abnormal; Notable for the following components:      Result Value   WBC 12.1 (*)    All other components within normal limits  DIFFERENTIAL - Abnormal; Notable for the following components:   Neutro Abs 8.5 (*)    All other components within normal limits  COMPREHENSIVE METABOLIC PANEL - Abnormal; Notable for the following components:   Glucose, Bld 120 (*)    AST 65 (*)    ALT 62 (*)    All other components within normal limits  CBG MONITORING, ED - Abnormal; Notable for the following components:   Glucose-Capillary 103 (*)    All other components within normal limits  PROTIME-INR  APTT  ETHANOL  RAPID URINE DRUG SCREEN, HOSP PERFORMED  URINALYSIS, ROUTINE W REFLEX MICROSCOPIC  I-STAT CHEM 8, ED    EKG None  Radiology CT HEAD CODE STROKE WO CONTRAST  Result Date: 05/18/2022 CLINICAL DATA:  Code stroke. Neuro deficit, acute, stroke suspected. Left leg and arm weakness. EXAM: CT HEAD WITHOUT CONTRAST TECHNIQUE: Contiguous axial images were obtained from the base of the skull through the vertex without intravenous contrast. RADIATION DOSE REDUCTION: This exam was performed  according to the departmental dose-optimization program which includes automated exposure control, adjustment of the mA and/or kV according to patient size and/or use of iterative reconstruction technique. COMPARISON:  07/02/2009 FINDINGS: Brain: Normal appearance without evidence of old or acute infarction, mass lesion, hemorrhage, hydrocephalus or extra-axial collection. Vascular: No abnormal vascular finding. Skull: Normal Sinuses/Orbits: Clear/normal Other: None ASPECTS (Alberta Stroke Program Early CT Score) - Ganglionic level infarction (caudate, lentiform nuclei, internal capsule, insula, M1-M3 cortex): 7 - Supraganglionic infarction (M4-M6 cortex): 3 Total score (0-10 with 10 being normal): 10 IMPRESSION: 1. Normal head CT.  2. Aspects is 10. These results were called by telephone at the time of interpretation on 05/18/2022 at 2:51 pm to provider Gloris Manchester , who verbally acknowledged these results. Electronically Signed   By: Paulina Fusi M.D.   On: 05/18/2022 14:52    Procedures Procedures    Medications Ordered in ED Medications - No data to display  ED Course/ Medical Decision Making/ A&P Clinical Course as of 05/18/22 1616  Mon May 18, 2022  1513 Is currently being assessed by the teleneurologist my initial history and exam is delayed.  She is well-appearing. [SG]    Clinical Course User Index [SG] Pricilla Loveless, MD                             Medical Decision Making Amount and/or Complexity of Data Reviewed Labs: ordered.    Details: Mild leukocytosis, likely reactive.  Slight LFT dysfunction.  Normal EtOH. Radiology: ordered and independent interpretation performed.    Details: No head bleed. ECG/medicine tests: ordered.  Risk Decision regarding hospitalization.   Patient presents with acute left-sided weakness.  Her left leg weakness seems to be more from sciatica and has been ongoing for a while.  However the left weakness is new.  No facial symptoms.  Called a code  stroke in triage though has declined TNK.  Symptoms do overall seem relatively mild.  Neurology is recommending stroke workup in the hospital.  She does have risk factors for stroke including smoking, hypertension, hyperlipidemia, obesity.  Discussed with Dr. Mariea Clonts for admission.        Final Clinical Impression(s) / ED Diagnoses Final diagnoses:  Left-sided weakness    Rx / DC Orders ED Discharge Orders     None         Pricilla Loveless, MD 05/18/22 [id number]

## 2022-05-18 NOTE — ED Notes (Signed)
Code stroke SPOK paged out at 1437

## 2022-05-18 NOTE — Progress Notes (Signed)
1436 call time 1444 exam started 1445 exam finished 1445 images sent to soc 1447 exam completed in epic 1445 [facility] radiology called

## 2022-05-19 ENCOUNTER — Observation Stay (HOSPITAL_COMMUNITY): Payer: BC Managed Care – PPO

## 2022-05-19 ENCOUNTER — Observation Stay (HOSPITAL_BASED_OUTPATIENT_CLINIC_OR_DEPARTMENT_OTHER): Payer: BC Managed Care – PPO

## 2022-05-19 ENCOUNTER — Other Ambulatory Visit (HOSPITAL_COMMUNITY): Payer: Self-pay | Admitting: *Deleted

## 2022-05-19 DIAGNOSIS — R531 Weakness: Secondary | ICD-10-CM | POA: Diagnosis not present

## 2022-05-19 DIAGNOSIS — I10 Essential (primary) hypertension: Secondary | ICD-10-CM | POA: Diagnosis not present

## 2022-05-19 DIAGNOSIS — E782 Mixed hyperlipidemia: Secondary | ICD-10-CM

## 2022-05-19 DIAGNOSIS — F17210 Nicotine dependence, cigarettes, uncomplicated: Secondary | ICD-10-CM | POA: Diagnosis not present

## 2022-05-19 DIAGNOSIS — R7303 Prediabetes: Secondary | ICD-10-CM

## 2022-05-19 DIAGNOSIS — R29898 Other symptoms and signs involving the musculoskeletal system: Secondary | ICD-10-CM | POA: Diagnosis not present

## 2022-05-19 LAB — ECHOCARDIOGRAM COMPLETE
AR max vel: 2.18 cm2
AV Area VTI: 2.39 cm2
AV Area mean vel: 2.25 cm2
AV Mean grad: 5.7 mmHg
AV Peak grad: 11 mmHg
Ao pk vel: 1.66 m/s
Area-P 1/2: 3.27 cm2
Height: 64 in
S' Lateral: 1.8 cm
Weight: 2960 oz

## 2022-05-19 LAB — LIPID PANEL
Cholesterol: 138 mg/dL (ref 0–200)
HDL: 46 mg/dL (ref >40)
LDL Cholesterol: 78 mg/dL (ref 0–99)
Total CHOL/HDL Ratio: 3 RATIO
Triglycerides: 68 mg/dL (ref <150)
VLDL: 14 mg/dL (ref 0–40)

## 2022-05-19 LAB — HEMOGLOBIN A1C
Hgb A1c MFr Bld: 5.6 % (ref 4.8–5.6)
Mean Plasma Glucose: 114.02 mg/dL

## 2022-05-19 LAB — HIV ANTIBODY (ROUTINE TESTING W REFLEX): HIV Screen 4th Generation wRfx: NONREACTIVE

## 2022-05-19 LAB — SURGICAL PATHOLOGY

## 2022-05-19 MED ORDER — CYCLOBENZAPRINE HCL 5 MG PO TABS: 5.0000 mg | ORAL_TABLET | Freq: Three times a day (TID) | ORAL | 0 refills | Status: DC | PRN

## 2022-05-19 MED ORDER — ATORVASTATIN CALCIUM 80 MG PO TABS
80.0000 mg | ORAL_TABLET | Freq: Every evening | ORAL | 3 refills | Status: DC
Start: 2022-05-19 — End: 2023-07-15

## 2022-05-19 MED ORDER — FENTANYL CITRATE PF 50 MCG/ML IJ SOSY: 12.5000 ug | PREFILLED_SYRINGE | INTRAMUSCULAR | Status: DC | PRN

## 2022-05-19 MED ORDER — METHYLPREDNISOLONE SODIUM SUCC 40 MG IJ SOLR
40.0000 mg | Freq: Once | INTRAMUSCULAR | Status: AC
  Administered 2022-05-19: 40 mg via INTRAVENOUS
  Filled 2022-05-19: qty 1

## 2022-05-19 MED ORDER — HYDROCHLOROTHIAZIDE 12.5 MG PO TABS
12.5000 mg | ORAL_TABLET | Freq: Every day | ORAL | Status: DC
  Administered 2022-05-19: 12.5 mg via ORAL
  Filled 2022-05-19: qty 1

## 2022-05-19 MED ORDER — CYCLOBENZAPRINE HCL 10 MG PO TABS: 5.0000 mg | ORAL_TABLET | Freq: Three times a day (TID) | ORAL | Status: DC | PRN

## 2022-05-19 MED ORDER — ATORVASTATIN CALCIUM 40 MG PO TABS: 80.0000 mg | ORAL_TABLET | Freq: Every evening | ORAL | Status: DC

## 2022-05-19 MED ORDER — LIDOCAINE 5 % EX PTCH: 2.0000 | MEDICATED_PATCH | Freq: Every day | CUTANEOUS | Status: DC | PRN

## 2022-05-19 MED ORDER — CITALOPRAM HYDROBROMIDE 20 MG PO TABS
20.0000 mg | ORAL_TABLET | Freq: Two times a day (BID) | ORAL | Status: DC
Start: 2022-05-19 — End: 2022-06-03

## 2022-05-19 MED ORDER — OLMESARTAN MEDOXOMIL-HCTZ 20-12.5 MG PO TABS: 1.0000 | ORAL_TABLET | Freq: Every day | ORAL | Status: DC

## 2022-05-19 MED ORDER — HYDROCODONE-ACETAMINOPHEN 5-325 MG PO TABS: 1.0000 | ORAL_TABLET | Freq: Four times a day (QID) | ORAL | Status: DC | PRN

## 2022-05-19 MED ORDER — IRBESARTAN 150 MG PO TABS
150.0000 mg | ORAL_TABLET | Freq: Every day | ORAL | Status: DC
  Administered 2022-05-19: 150 mg via ORAL
  Filled 2022-05-19: qty 1

## 2022-05-19 NOTE — Discharge Summary (Signed)
Physician Discharge Summary  Haley Nielsen ZOX:[id number] DOB: [birth year] DOA: 05/18/2022  PCP: Gabriel Earing, FNP  Admit date: 05/18/2022 Discharge date: 05/19/2022  Admitted From:  Home  Disposition: Home with outpatient PT/OT  Recommendations for Outpatient Follow-up:  Follow up with PCP in 3-7 days  Follow up with neurology in 4 weeks Discuss return to work with PCP   Home Health: outpatient PT/OT   Discharge Condition: STABLE   CODE STATUS: FULL DIET: resume heart healthy    Brief Hospitalization Summary: Please see all hospital notes, images, labs for full details of the hospitalization. ADMISSION HPI:  57 y.o. female with medical history significant for hypertension, asthma, ongoing tobacco abuse, anxiety. Patient was referred to the ED from outpatient providers office with complaints of sudden onset left-sided upper and lower extremity numbness and weakness.  Patient's went to see her gynecologist earlier today, subsequently went to see an outpatient provider when these symptoms started at about 1:30 PM.  She reports some changes to her vision in both eyes and mild dizziness.  No change in speech.  She is unaware of any facial asymmetry.  Spouse is at bedside. No history of strokes.  She smokes about 7 cigarettes daily.  Takes aspirin daily. Symptoms have persisted in severity since onset.   Also reports onset of mid low back pain that started about a week ago with radiation down to her left hip and leg.  Pain is constant in intensity with or without ambulation.  No history of back pain.    ED Course: Blood pressure 149/83.  Heart rate 73.  Temperature 98.  Code stroke called.  Head CT negative for acute abnormality.  Evaluated by teleneurologist Dr. Otelia Limes.  Patient declined TNK.  Admission for stroke workup requested.  Hospital Course by problem   Assessment and Plan: * Left-sided weakness -- CVA ruled out Left upper and lower extremity weakness and numbness- persistent  since onset at 1:30 PM. No aphasia.  Evaluated by teleneurologist Dr. Earnest Conroy note, slight droop of left noted.  Called, head CT negative for acute abnormality.  Patient and her husband declined TNK.  On aspirin. -Stroke workup to include MRI brain neg for acute findings, CTA head and neck negative for acute findings, TTE, telemetry - HgbA1c, Lipid panel - reassuring -PT, OT, Speech eval pending -325 mg aspirin x 1, continue 81 mg daily  -Resume atorvastatin 80 mg daily    Back pain Acute onset back pain of 1 week.  No improvement in pain with NSAIDs.  Recent workup lumbar MRI W Wo contrast- 05/14/2022- Small left foraminal to extraforaminal disc protrusion at L4-5,  closely approximating and potentially affecting the exiting left L4  nerve root.  2. Mild to moderate left greater than right facet hypertrophy at  L4-5 with associated marrow edema. Finding could contribute to lower  back pain and referred symptoms.  -She also had pelvic MRI 4/25, and CT abdominal pelvis 4/23 but without acute abnormality. -MRI findings would explain her back pain with left sciatica consider follow-up with Ortho-spine, if steroid injection will help. -IV morphine 2 mg x 1, continue with hydrocodone -Muscle relaxants Flexeril x 1 -Trial of prednisone 40 mg x 1 -PT recommending CIR - awaiting OT eval and CIR eval   Anxiety Resume BuSpar, Celexa.  Prediabetes -- diet controlled  Cigarette smoker Ongoing tobacco abuse.  7 cigarettes daily. - Counseled to quit smoking. -Nicotine patch  Mixed hyperlipidemia -- atorvastatin dose timing changed to evening for better lipid lowering effect  Primary hypertension -- CVA ruled out , can resume home BP lowering medication       Discharge Diagnoses:  Principal Problem:   Left-sided weakness -- CVA ruled out Active Problems:   Primary hypertension   Mixed hyperlipidemia   Cigarette smoker   Prediabetes   Anxiety   Back pain   Discharge  Instructions: Discharge Instructions     Ambulatory referral to Neurology   Complete by: As directed    An appointment is requested in approximately: 4 weeks   Ambulatory referral to Occupational Therapy   Complete by: As directed    Ambulatory referral to Physical Therapy   Complete by: As directed       Allergies as of 05/19/2022       Reactions   Penicillins Hives        Medication List     STOP taking these medications    methocarbamol 500 MG tablet Commonly known as: ROBAXIN       TAKE these medications    aspirin EC 81 MG tablet Take 1 tablet (81 mg total) by mouth daily. Swallow whole.   atorvastatin 80 MG tablet Commonly known as: LIPITOR Take 1 tablet (80 mg total) by mouth every evening. Keep on file What changed: when to take this   busPIRone 5 MG tablet Commonly known as: BUSPAR Take 1 tablet (5 mg total) by mouth 2 (two) times daily.   citalopram 20 MG tablet Commonly known as: CELEXA Take 1 tablet (20 mg total) by mouth 2 (two) times daily.   cyclobenzaprine 5 MG tablet Commonly known as: FLEXERIL Take 1 tablet (5 mg total) by mouth 3 (three) times daily as needed for muscle spasms. DO NOT DRIVE OR OPERATE MACHINERY ON THIS MEDICATION   EQ All Day Allergy Relief 10 MG tablet Generic drug: loratadine Take 1 tablet by mouth once daily   ezetimibe 10 MG tablet Commonly known as: Zetia Take 1 tablet (10 mg total) by mouth daily.   Fish Oil 1000 MG Caps Take by mouth.   fluticasone 50 MCG/ACT nasal spray Commonly known as: FLONASE Place 2 sprays into both nostrils daily.   lidocaine 5 % Commonly known as: Lidoderm Place 1 patch onto the skin daily. Remove & Discard patch within 12 hours or as directed by MD   multivitamin with minerals Tabs tablet Take 1 tablet by mouth daily.   olmesartan-hydrochlorothiazide 20-12.5 MG tablet Commonly known as: BENICAR HCT Take 1 tablet by mouth daily.   omeprazole 20 MG capsule Commonly known  as: PRILOSEC Take 1 capsule by mouth once daily   Trelegy Ellipta 100-62.5-25 MCG/ACT Aepb Generic drug: Fluticasone-Umeclidin-Vilant Inhale 1 puff into the lungs daily.   Ventolin HFA 108 (90 Base) MCG/ACT inhaler Generic drug: albuterol INHALE 1 TO 2 PUFFS BY MOUTH EVERY 4 HOURS AS NEEDED FOR WHEEZING OR SHORTNESS OF BREATH What changed: See the new instructions.               Durable Medical Equipment  (From admission, onward)           Start     Ordered   05/19/22 1510  For home use only DME Walker rolling  Once       Question Answer Comment  Walker: With 5 Inch Wheels   Patient needs a walker to treat with the following condition Gait instability      05/19/22 1509            Follow-up Information  Gabriel Earing, FNP. Schedule an appointment as soon as possible for a visit in 3 day(s).   Specialty: Family Medicine Why: Hospital Follow Up Contact information: [address] [place] [address] [phone]                Allergies  Allergen Reactions   Penicillins Hives   Allergies as of 05/19/2022       Reactions   Penicillins Hives        Medication List     STOP taking these medications    methocarbamol 500 MG tablet Commonly known as: ROBAXIN       TAKE these medications    aspirin EC 81 MG tablet Take 1 tablet (81 mg total) by mouth daily. Swallow whole.   atorvastatin 80 MG tablet Commonly known as: LIPITOR Take 1 tablet (80 mg total) by mouth every evening. Keep on file What changed: when to take this   busPIRone 5 MG tablet Commonly known as: BUSPAR Take 1 tablet (5 mg total) by mouth 2 (two) times daily.   citalopram 20 MG tablet Commonly known as: CELEXA Take 1 tablet (20 mg total) by mouth 2 (two) times daily.   cyclobenzaprine 5 MG tablet Commonly known as: FLEXERIL Take 1 tablet (5 mg total) by mouth 3 (three) times daily as needed for muscle spasms. DO NOT DRIVE OR OPERATE MACHINERY ON THIS  MEDICATION   EQ All Day Allergy Relief 10 MG tablet Generic drug: loratadine Take 1 tablet by mouth once daily   ezetimibe 10 MG tablet Commonly known as: Zetia Take 1 tablet (10 mg total) by mouth daily.   Fish Oil 1000 MG Caps Take by mouth.   fluticasone 50 MCG/ACT nasal spray Commonly known as: FLONASE Place 2 sprays into both nostrils daily.   lidocaine 5 % Commonly known as: Lidoderm Place 1 patch onto the skin daily. Remove & Discard patch within 12 hours or as directed by MD   multivitamin with minerals Tabs tablet Take 1 tablet by mouth daily.   olmesartan-hydrochlorothiazide 20-12.5 MG tablet Commonly known as: BENICAR HCT Take 1 tablet by mouth daily.   omeprazole 20 MG capsule Commonly known as: PRILOSEC Take 1 capsule by mouth once daily   Trelegy Ellipta 100-62.5-25 MCG/ACT Aepb Generic drug: Fluticasone-Umeclidin-Vilant Inhale 1 puff into the lungs daily.   Ventolin HFA 108 (90 Base) MCG/ACT inhaler Generic drug: albuterol INHALE 1 TO 2 PUFFS BY MOUTH EVERY 4 HOURS AS NEEDED FOR WHEEZING OR SHORTNESS OF BREATH What changed: See the new instructions.               Durable Medical Equipment  (From admission, onward)           Start     Ordered   05/19/22 1510  For home use only DME Walker rolling  Once       Question Answer Comment  Walker: With 5 Inch Wheels   Patient needs a walker to treat with the following condition Gait instability      05/19/22 1509            Procedures/Studies: ECHOCARDIOGRAM COMPLETE  Result Date: 05/19/2022    ECHOCARDIOGRAM REPORT   Patient Name:   Haley Nielsen Date of Exam: 05/19/2022 Medical Rec #:  [id number]      Height:       64.0 in Accession #:    [id number]     Weight:       185.0 lb Date of Birth:  1965-04-21       BSA:          1.893 m Patient Age:    57 years       BP:           138/79 mmHg Patient Gender: F              HR:           65 bpm. [facility] Procedure: 2D Echo,  Cardiac Doppler and Color Doppler Indications:    Stroke I63.9  History:        Patient has no prior history of Echocardiogram examinations.                 Signs/Symptoms:Syncope; Risk Factors:Hypertension, Dyslipidemia,                 Current Smoker and Prediabetes.  Sonographer:    Celesta Gentile RCS Referring Phys: [phone] Heloise Beecham EMOKPAE IMPRESSIONS  1. Left ventricular ejection fraction, by estimation, is 60 to 65%. The left ventricle has normal function. The left ventricle has no regional wall motion abnormalities. There is mild left ventricular hypertrophy. Left ventricular diastolic parameters are indeterminate.  2. Right ventricular systolic function is normal. The right ventricular size is normal.  3. The mitral valve is normal in structure. No evidence of mitral valve regurgitation. No evidence of mitral stenosis.  4. The aortic valve is tricuspid. Aortic valve regurgitation is trivial. No aortic stenosis is present.  5. The inferior vena cava is normal in size with greater than 50% respiratory variability, suggesting right atrial pressure of 3 mmHg. FINDINGS  Left Ventricle: Left ventricular ejection fraction, by estimation, is 60 to 65%. The left ventricle has normal function. The left ventricle has no regional wall motion abnormalities. The left ventricular internal cavity size was normal in size. There is  mild left ventricular hypertrophy. Left ventricular diastolic parameters are indeterminate. Right Ventricle: The right ventricular size is normal. Right vetricular wall thickness was not well visualized. Right ventricular systolic function is normal. Left Atrium: Left atrial size was normal in size. Right Atrium: Right atrial size was normal in size. Pericardium: There is no evidence of pericardial effusion. Mitral Valve: The mitral valve is normal in structure. No evidence of mitral valve regurgitation. No evidence of mitral valve stenosis. Tricuspid Valve: The tricuspid valve is normal in  structure. Tricuspid valve regurgitation is not demonstrated. No evidence of tricuspid stenosis. Aortic Valve: The aortic valve is tricuspid. Aortic valve regurgitation is trivial. No aortic stenosis is present. Aortic valve mean gradient measures 5.7 mmHg. Aortic valve peak gradient measures 11.0 mmHg. Aortic valve area, by VTI measures 2.39 cm. Pulmonic Valve: The pulmonic valve was not well visualized. Pulmonic valve regurgitation is not visualized. No evidence of pulmonic stenosis. Aorta: The aortic root is normal in size and structure. Venous: The inferior vena cava is normal in size with greater than 50% respiratory variability, suggesting right atrial pressure of 3 mmHg. IAS/Shunts: No atrial level shunt detected by color flow Doppler.  LEFT VENTRICLE PLAX 2D LVIDd:         4.20 cm   Diastology LVIDs:         1.80 cm   LV e' medial:    6.20 cm/s LV PW:         1.10 cm   LV E/e' medial:  17.3 LV IVS:        1.00 cm   LV e' lateral:   6.53  cm/s LVOT diam:     1.80 cm   LV E/e' lateral: 16.4 LV SV:         83 LV SV Index:   44 LVOT Area:     2.54 cm  RIGHT VENTRICLE RV S prime:     14.60 cm/s TAPSE (M-mode): 2.4 cm LEFT ATRIUM             Index        RIGHT ATRIUM           Index LA diam:        3.70 cm 1.95 cm/m   RA Area:     15.10 cm LA Vol (A2C):   56.6 ml 29.91 ml/m  RA Volume:   39.60 ml  20.92 ml/m LA Vol (A4C):   49.9 ml 26.37 ml/m LA Biplane Vol: 57.9 ml 30.59 ml/m  AORTIC VALVE AV Area (Vmax):    2.18 cm AV Area (Vmean):   2.25 cm AV Area (VTI):     2.39 cm AV Vmax:           165.60 cm/s AV Vmean:          112.350 cm/s AV VTI:            0.348 m AV Peak Grad:      11.0 mmHg AV Mean Grad:      5.7 mmHg LVOT Vmax:         142.00 cm/s LVOT Vmean:        99.300 cm/s LVOT VTI:          0.327 m LVOT/AV VTI ratio: 0.94  AORTA Ao Root diam: 3.20 cm MITRAL VALVE MV Area (PHT): 3.27 cm     SHUNTS MV Decel Time: 232 msec     Systemic VTI:  0.33 m MV E velocity: 107.00 cm/s  Systemic Diam: 1.80 cm MV A  velocity: 95.50 cm/s MV E/A ratio:  1.12 Dina Rich MD Electronically signed by Dina Rich MD Signature Date/Time: 05/19/2022/12:54:30 PM    Final    MR BRAIN WO CONTRAST  Result Date: 05/19/2022 CLINICAL DATA:  Left upper and lower extremity weakness EXAM: MRI HEAD WITHOUT CONTRAST TECHNIQUE: Multiplanar, multiecho pulse sequences of the brain and surrounding structures were obtained without intravenous contrast. COMPARISON:  Same day CT head FINDINGS: Brain: No acute infarction, hemorrhage, hydrocephalus, extra-axial collection or mass lesion. Vascular: Normal flow voids. Skull and upper cervical spine: Normal marrow signal. Sinuses/Orbits: Polypoid mucosal thickening left maxillary sinus. Other: None IMPRESSION: No acute abnormality. Electronically Signed   By: Lorenza Cambridge M.D.   On: 05/19/2022 10:39   CT ANGIO HEAD NECK W WO CM  Result Date: 05/18/2022 CLINICAL DATA:  Left-sided upper and lower extremity weakness and numbness. EXAM: CT ANGIOGRAPHY HEAD AND NECK WITH AND WITHOUT CONTRAST TECHNIQUE: Multidetector CT imaging of the head and neck was performed using the standard protocol during bolus administration of intravenous contrast. Multiplanar CT image reconstructions and MIPs were obtained to evaluate the vascular anatomy. Carotid stenosis measurements (when applicable) are obtained utilizing NASCET criteria, using the distal internal carotid diameter as the denominator. RADIATION DOSE REDUCTION: This exam was performed according to the departmental dose-optimization program which includes automated exposure control, adjustment of the mA and/or kV according to patient size and/or use of iterative reconstruction technique. CONTRAST:  75mL OMNIPAQUE IOHEXOL 350 MG/ML SOLN COMPARISON:  None Available. FINDINGS: CTA NECK FINDINGS Aortic arch: Two-vessel arch configuration with common origin of the right brachiocephalic and left common carotid  arteries. Atherosclerotic calcifications of the  aortic arch and arch vessel origins. Arch vessel origins are patent. Right carotid system: No evidence of dissection, stenosis (50% or greater), or occlusion. Mild mixed plaque along the right carotid bulb and proximal right ICA. Left carotid system: No evidence of dissection, stenosis (50% or greater), or occlusion. Mild mixed plaque along the left carotid bulb and proximal left ICA. Vertebral arteries: Codominant. No evidence of dissection, stenosis (50% or greater), or occlusion. Skeleton: Unremarkable. Other neck: Unremarkable. Upper chest: Unremarkable. Review of the MIP images confirms the above findings CTA HEAD FINDINGS Anterior circulation: Intracranial ICAs are patent without stenosis or aneurysm. The proximal ACAs and MCAs are patent without stenosis or aneurysm. Distal branches are symmetric. Posterior circulation: Normal basilar artery. The SCAs, AICAs and PICAs are patent proximally. The PCAs are patent proximally without stenosis or aneurysm. Distal branches are symmetric. Venous sinuses: As permitted by contrast timing, patent. Anatomic variants: None. Review of the MIP images confirms the above findings IMPRESSION: 1. No large vessel occlusion, hemodynamically significant stenosis, or aneurysm in the head or neck vessels. 2. Aortic atherosclerosis (ICD10-I70.0). Electronically Signed   By: Orvan Falconer M.D.   On: 05/18/2022 21:14   CT HEAD CODE STROKE WO CONTRAST  Result Date: 05/18/2022 CLINICAL DATA:  Code stroke. Neuro deficit, acute, stroke suspected. Left leg and arm weakness. EXAM: CT HEAD WITHOUT CONTRAST TECHNIQUE: Contiguous axial images were obtained from the base of the skull through the vertex without intravenous contrast. RADIATION DOSE REDUCTION: This exam was performed according to the departmental dose-optimization program which includes automated exposure control, adjustment of the mA and/or kV according to patient size and/or use of iterative reconstruction technique.  COMPARISON:  07/02/2009 FINDINGS: Brain: Normal appearance without evidence of old or acute infarction, mass lesion, hemorrhage, hydrocephalus or extra-axial collection. Vascular: No abnormal vascular finding. Skull: Normal Sinuses/Orbits: Clear/normal Other: None ASPECTS (Alberta Stroke Program Early CT Score) - Ganglionic level infarction (caudate, lentiform nuclei, internal capsule, insula, M1-M3 cortex): 7 - Supraganglionic infarction (M4-M6 cortex): 3 Total score (0-10 with 10 being normal): 10 IMPRESSION: 1. Normal head CT. 2. Aspects is 10. These results were called by telephone at the time of interpretation on 05/18/2022 at 2:51 pm to provider Gloris Manchester , who verbally acknowledged these results. Electronically Signed   By: Paulina Fusi M.D.   On: 05/18/2022 14:52     Subjective:   Discharge Exam: Vitals:   05/19/22 0804 05/19/22 1300  BP: 138/79 (!) 151/74  Pulse: 65   Resp: 18 16  Temp: 97.7 F (36.5 C) 98.3 F (36.8 C)  SpO2: 94% 97%   Vitals:   05/19/22 0430 05/19/22 0710 05/19/22 0804 05/19/22 1300  BP: (!) 154/76  138/79 (!) 151/74  Pulse: 62 76 65   Resp: 20 18 18 16   Temp: 98.2 F (36.8 C)  97.7 F (36.5 C) 98.3 F (36.8 C)  TempSrc: Oral  Oral Oral  SpO2: 95% 95% 94% 97%  Weight:      Height:        General: Pt is alert, awake, not in acute distress Cardiovascular: RRR, S1/S2 +, no rubs, no gallops Respiratory: CTA bilaterally, no wheezing, no rhonchi Abdominal: Soft, NT, ND, bowel sounds + Extremities: no edema, no cyanosis   The results of significant diagnostics from this hospitalization (including imaging, microbiology, ancillary and laboratory) are listed below for reference.     Microbiology: No results found for this or any previous visit (from the past 240 hour(s)).  Labs: BNP (last 3 results) No results for input(s): "BNP" in the last 8760 hours. Basic Metabolic Panel: Recent Labs  Lab 05/18/22 1503  NA 137  K 3.9  CL 105  CO2 22   GLUCOSE 120*  BUN 12  CREATININE 0.72  CALCIUM 9.6   Liver Function Tests: Recent Labs  Lab 05/18/22 1503  AST 65*  ALT 62*  ALKPHOS 77  BILITOT 0.9  PROT 7.7  ALBUMIN 4.3   No results for input(s): "LIPASE", "AMYLASE" in the last 168 hours. No results for input(s): "AMMONIA" in the last 168 hours. CBC: Recent Labs  Lab 05/18/22 1503  WBC 12.1*  NEUTROABS 8.5*  HGB 14.6  HCT 41.8  MCV 91.3  PLT 369   Cardiac Enzymes: No results for input(s): "CKTOTAL", "CKMB", "CKMBINDEX", "TROPONINI" in the last 168 hours. BNP: Invalid input(s): "POCBNP" CBG: Recent Labs  Lab 05/18/22 1431  GLUCAP 103*   D-Dimer No results for input(s): "DDIMER" in the last 72 hours. Hgb A1c Recent Labs    05/19/22 0452  HGBA1C 5.6   Lipid Profile Recent Labs    05/19/22 0452  CHOL 138  HDL 46  LDLCALC 78  TRIG 68  CHOLHDL 3.0   Thyroid function studies No results for input(s): "TSH", "T4TOTAL", "T3FREE", "THYROIDAB" in the last 72 hours.  Invalid input(s): "FREET3" Anemia work up No results for input(s): "VITAMINB12", "FOLATE", "FERRITIN", "TIBC", "IRON", "RETICCTPCT" in the last 72 hours. Urinalysis    Component Value Date/Time   COLORURINE YELLOW 05/18/2022 1924   APPEARANCEUR HAZY (A) 05/18/2022 1924   APPEARANCEUR Clear 05/07/2021 1557   LABSPEC 1.027 05/18/2022 1924   PHURINE 5.0 05/18/2022 1924   GLUCOSEU NEGATIVE 05/18/2022 1924   HGBUR MODERATE (A) 05/18/2022 1924   BILIRUBINUR NEGATIVE 05/18/2022 1924   BILIRUBINUR Negative 05/07/2021 1557   KETONESUR NEGATIVE 05/18/2022 1924   PROTEINUR 30 (A) 05/18/2022 1924   NITRITE NEGATIVE 05/18/2022 1924   LEUKOCYTESUR MODERATE (A) 05/18/2022 1924   Sepsis Labs Recent Labs  Lab 05/18/22 1503  WBC 12.1*   Microbiology No results found for this or any previous visit (from the past 240 hour(s)).  Time coordinating discharge:   SIGNED:  Standley Dakins, MD  [facility] 05/19/2022, 3:19 PM How to  contact the [facility] Attending or Consulting provider 7A - 7P or covering provider during after hours 7P -7A, for this patient?  Check the care team in [facility] and look for a) attending/consulting [facility] provider listed and b) the [facility] team listed Log into [email or web address] and use 's universal password to access. If you do not have the password, please contact the hospital operator. Locate the [facility] provider you are looking for under [facility] and page to a number that you can be directly reached. If you still have difficulty reaching the provider, please page the [facility] (Director on Call) for the Hospitalists listed on amion for assistance.

## 2022-05-19 NOTE — Progress Notes (Addendum)
Inpatient Rehabilitation Admissions Coordinator   Rehab consult received. I await further medical workup to determine most appropriate rehab venue options. Noted Observation status.  Ottie Glazier, RN, MSN Rehab Admissions Coordinator [phone] 05/19/2022 1:50 PM

## 2022-05-19 NOTE — Discharge Instructions (Signed)
IMPORTANT INFORMATION: PAY CLOSE ATTENTION   PHYSICIAN DISCHARGE INSTRUCTIONS  Follow with Primary care provider  Morgan, Tiffany M, FNP  and other consultants as instructed by your Hospitalist Physician  SEEK MEDICAL CARE OR RETURN TO EMERGENCY ROOM IF SYMPTOMS COME BACK, WORSEN OR NEW PROBLEM DEVELOPS   Please note: You were cared for by a hospitalist during your hospital stay. Every effort will be made to forward records to your primary care provider.  You can request that your primary care provider send for your hospital records if they have not received them.  Once you are discharged, your primary care physician will handle any further medical issues. Please note that NO REFILLS for any discharge medications will be authorized once you are discharged, as it is imperative that you return to your primary care physician (or establish a relationship with a primary care physician if you do not have one) for your post hospital discharge needs so that they can reassess your need for medications and monitor your lab values.  Please get a complete blood count and chemistry panel checked by your Primary MD at your next visit, and again as instructed by your Primary MD.  Get Medicines reviewed and adjusted: Please take all your medications with you for your next visit with your Primary MD  Laboratory/radiological data: Please request your Primary MD to go over all hospital tests and procedure/radiological results at the follow up, please ask your primary care provider to get all Hospital records sent to his/her office.  In some cases, they will be blood work, cultures and biopsy results pending at the time of your discharge. Please request that your primary care provider follow up on these results.  If you are diabetic, please bring your blood sugar readings with you to your follow up appointment with primary care.    Please call and make your follow up appointments as soon as possible.    Also  Note the following: If you experience worsening of your admission symptoms, develop shortness of breath, life threatening emergency, suicidal or homicidal thoughts you must seek medical attention immediately by calling 911 or calling your MD immediately  if symptoms less severe.  You must read complete instructions/literature along with all the possible adverse reactions/side effects for all the Medicines you take and that have been prescribed to you. Take any new Medicines after you have completely understood and accpet all the possible adverse reactions/side effects.   Do not drive when taking Pain medications or sleeping medications (Benzodiazepines)  Do not take more than prescribed Pain, Sleep and Anxiety Medications. It is not advisable to combine anxiety,sleep and pain medications without talking with your primary care practitioner  Special Instructions: If you have smoked or chewed Tobacco  in the last 2 yrs please stop smoking, stop any regular Alcohol  and or any Recreational drug use.  Wear Seat belts while driving.  Do not drive if taking any narcotic, mind altering or controlled substances or recreational drugs or alcohol.       

## 2022-05-19 NOTE — Plan of Care (Signed)
  Problem: Acute Rehab PT Goals(only PT should resolve) Goal: Pt Will Go Supine/Side To Sit Outcome: Progressing Flowsheets (Taken 05/19/2022 1238) Pt will go Supine/Side to Sit:  with modified independence  Independently Goal: Patient Will Transfer Sit To/From Stand Outcome: Progressing Flowsheets (Taken 05/19/2022 1238) Patient will transfer sit to/from stand: with min guard assist Goal: Pt Will Transfer Bed To Chair/Chair To Bed Outcome: Progressing Flowsheets (Taken 05/19/2022 1238) Pt will Transfer Bed to Chair/Chair to Bed: min guard assist Goal: Pt Will Ambulate Outcome: Progressing Flowsheets (Taken 05/19/2022 1238) Pt will Ambulate:  50 feet  with minimal assist  with rolling walker   12:39 PM, 05/19/22 Ocie Bob, MPT Physical Therapist with [facility] 336 [phone] office [phone] mobile phone

## 2022-05-19 NOTE — Progress Notes (Signed)
OT Cancellation Note  Patient Details Name: Haley Nielsen MRN: [id number] DOB: 1965-04-20   Cancelled Treatment:    Reason Eval/Treat Not Completed: Patient at procedure or test/ unavailable. Pt being seen by echo team member. Will attempt to see pt later as time permits.   Stephanny Tsutsui OT, MOT   Danie Chandler 05/19/2022, 9:22 AM

## 2022-05-19 NOTE — Progress Notes (Signed)
*  PRELIMINARY RESULTS* Echocardiogram 2D Echocardiogram has been performed.  Stacey Drain 05/19/2022, 9:23 AM

## 2022-05-19 NOTE — TOC Progression Note (Signed)
Transition of Care [facility]) - Progression Note    Patient Details  Name: Haley Nielsen MRN: [id number] Date of Birth: [birth year]  [facility]) CM/SW Contact  Karn Cassis, [place] Phone Number: 05/19/2022, 1:54 PM  Clinical Narrative:  PT recommending CIR. Awaiting OT evaluation. TOC will continue to follow.            Expected Discharge Plan and Services                                               Social Determinants of Health (SDOH) Interventions SDOH Screenings   Food Insecurity: No Food Insecurity (05/18/2022)  Housing: Low Risk  (05/18/2022)  Transportation Needs: No Transportation Needs (05/18/2022)  Utilities: Not At Risk (05/18/2022)  Depression (PHQ2-9): High Risk (05/18/2022)  Tobacco Use: High Risk (05/18/2022)    Readmission Risk Interventions     No data to display

## 2022-05-19 NOTE — Progress Notes (Signed)
Discharge instructions provided to patient. All medications, follow up appointments, and discharge instructions discussed. IV out. Monitor off CCMD notified. Discharging home with husband.

## 2022-05-19 NOTE — Progress Notes (Signed)
PROGRESS NOTE   Haley Nielsen  WUJ:[id number] DOB: 1965/02/10 DOA: 05/18/2022 PCP: Gabriel Earing, FNP   Chief Complaint  Patient presents with   Numbness   Level of care: Telemetry  Brief Admission History:   57 y.o. female with medical history significant for hypertension, asthma, ongoing tobacco abuse, anxiety.  Patient was referred to the ED from outpatient providers office with complaints of sudden onset left-sided upper and lower extremity numbness and weakness.  Patient's went to see her gynecologist earlier today, subsequently went to see an outpatient provider when these symptoms started at about 1:30 PM.  She reports some changes to her vision in both eyes and mild dizziness.  No change in speech.  She is unaware of any facial asymmetry.  Spouse is at bedside.  No history of strokes.  She smokes about 7 cigarettes daily.  Takes aspirin daily. Symptoms have persisted in severity since onset.   Also reports onset of mid low back pain that started about a week ago with radiation down to her left hip and leg.  Pain is constant in intensity with or without ambulation.  No history of back pain.    ED Course: Blood pressure 149/83.  Heart rate 73.  Temperature 98.  Code stroke called.  Head CT negative for acute abnormality.  Evaluated by teleneurologist Dr. Otelia Limes.  Patient declined TNK.  Admission for stroke workup requested.   Assessment and Plan: * Left-sided weakness -- CVA ruled out Left upper and lower extremity weakness and numbness- persistent since onset at 1:30 PM. No aphasia.  Evaluated by teleneurologist Dr. Earnest Conroy note, slight droop of left noted.  Called, head CT negative for acute abnormality.  Patient and her husband declined TNK.  On aspirin. -Stroke workup to include MRI brain neg for acute findings, CTA head and neck negative for acute findings, TTE, telemetry - HgbA1c, Lipid panel - reassuring -PT, OT, Speech eval pending -325 mg aspirin x 1, continue 81 mg  daily  -Resume atorvastatin 80 mg daily    Back pain Acute onset back pain of 1 week.  No improvement in pain with NSAIDs.  Recent workup lumbar MRI W Wo contrast- 05/14/2022- Small left foraminal to extraforaminal disc protrusion at L4-5,  closely approximating and potentially affecting the exiting left L4  nerve root.  2. Mild to moderate left greater than right facet hypertrophy at  L4-5 with associated marrow edema. Finding could contribute to lower  back pain and referred symptoms.  -She also had pelvic MRI 4/25, and CT abdominal pelvis 4/23 but without acute abnormality. -MRI findings would explain her back pain with left sciatica consider follow-up with Ortho-spine, if steroid injection will help. -IV morphine 2 mg x 1, continue with hydrocodone -Muscle relaxants Flexeril x 1 -Trial of prednisone 40 mg x 1 -PT recommending CIR - awaiting OT eval and CIR eval   Anxiety Resume BuSpar, Celexa.  Prediabetes -- diet controlled  Cigarette smoker Ongoing tobacco abuse.  7 cigarettes daily. - Counseled to quit smoking. -Nicotine patch  Mixed hyperlipidemia -- atorvastatin dose timing changed to evening for better lipid lowering effect  Primary hypertension -- CVA ruled out , can resume home BP lowering medication    DVT prophylaxis: enoxaparin  Code Status: Full  Family Communication:  Disposition: awaiting CIR evaluation    Consultants:  neuro Procedures:   Antimicrobials:    Subjective: Pt c/o left sided weakness unchanged and low back pain  Objective: Vitals:   05/19/22 0430 05/19/22 0710 05/19/22 0804  05/19/22 1300  BP: (!) 154/76  138/79 (!) 151/74  Pulse: 62 76 65   Resp: 20 18 18 16   Temp: 98.2 F (36.8 C)  97.7 F (36.5 C) 98.3 F (36.8 C)  TempSrc: Oral  Oral Oral  SpO2: 95% 95% 94% 97%  Weight:      Height:       No intake or output data in the 24 hours ending 05/19/22 1433 Filed Weights   05/18/22 1430  Weight: 83.9 kg    Examination:  General exam: Appears calm and comfortable  Respiratory system: Clear to auscultation. Respiratory effort normal. Cardiovascular system: normal S1 & S2 heard. No JVD, murmurs, rubs, gallops or clicks. No pedal edema. Gastrointestinal system: Abdomen is nondistended, soft and nontender. No organomegaly or masses felt. Normal bowel sounds heard. Central nervous system: Alert and oriented. No focal neurological deficits. Extremities: Symmetric 5 x 5 power. Skin: No rashes, lesions or ulcers. Psychiatry: Judgement and insight appear normal. Mood & affect appropriate.   Data Reviewed: I have personally reviewed following labs and imaging studies  CBC: Recent Labs  Lab 05/18/22 1503  WBC 12.1*  NEUTROABS 8.5*  HGB 14.6  HCT 41.8  MCV 91.3  PLT 369    Basic Metabolic Panel: Recent Labs  Lab 05/18/22 1503  NA 137  K 3.9  CL 105  CO2 22  GLUCOSE 120*  BUN 12  CREATININE 0.72  CALCIUM 9.6    CBG: Recent Labs  Lab 05/18/22 1431  GLUCAP 103*    No results found for this or any previous visit (from the past 240 hour(s)).   Radiology Studies: ECHOCARDIOGRAM COMPLETE  Result Date: 05/19/2022    ECHOCARDIOGRAM REPORT   Patient Name:   Haley Nielsen Date of Exam: 05/19/2022 Medical Rec #:  [id number]      Height:       64.0 in Accession #:    [id number]     Weight:       185.0 lb Date of Birth:  04-27-1965       BSA:          1.893 m Patient Age:    57 years       BP:           138/79 mmHg Patient Gender: F              HR:           65 bpm. [facility] Procedure: 2D Echo, Cardiac Doppler and Color Doppler Indications:    Stroke I63.9  History:        Patient has no prior history of Echocardiogram examinations.                 Signs/Symptoms:Syncope; Risk Factors:Hypertension, Dyslipidemia,                 Current Smoker and Prediabetes.  Sonographer:    Celesta Gentile RCS Referring Phys: [phone] Heloise Beecham EMOKPAE IMPRESSIONS  1. Left ventricular  ejection fraction, by estimation, is 60 to 65%. The left ventricle has normal function. The left ventricle has no regional wall motion abnormalities. There is mild left ventricular hypertrophy. Left ventricular diastolic parameters are indeterminate.  2. Right ventricular systolic function is normal. The right ventricular size is normal.  3. The mitral valve is normal in structure. No evidence of mitral valve regurgitation. No evidence of mitral stenosis.  4. The aortic valve is tricuspid. Aortic valve regurgitation is trivial. No aortic stenosis is  present.  5. The inferior vena cava is normal in size with greater than 50% respiratory variability, suggesting right atrial pressure of 3 mmHg. FINDINGS  Left Ventricle: Left ventricular ejection fraction, by estimation, is 60 to 65%. The left ventricle has normal function. The left ventricle has no regional wall motion abnormalities. The left ventricular internal cavity size was normal in size. There is  mild left ventricular hypertrophy. Left ventricular diastolic parameters are indeterminate. Right Ventricle: The right ventricular size is normal. Right vetricular wall thickness was not well visualized. Right ventricular systolic function is normal. Left Atrium: Left atrial size was normal in size. Right Atrium: Right atrial size was normal in size. Pericardium: There is no evidence of pericardial effusion. Mitral Valve: The mitral valve is normal in structure. No evidence of mitral valve regurgitation. No evidence of mitral valve stenosis. Tricuspid Valve: The tricuspid valve is normal in structure. Tricuspid valve regurgitation is not demonstrated. No evidence of tricuspid stenosis. Aortic Valve: The aortic valve is tricuspid. Aortic valve regurgitation is trivial. No aortic stenosis is present. Aortic valve mean gradient measures 5.7 mmHg. Aortic valve peak gradient measures 11.0 mmHg. Aortic valve area, by VTI measures 2.39 cm. Pulmonic Valve: The pulmonic valve  was not well visualized. Pulmonic valve regurgitation is not visualized. No evidence of pulmonic stenosis. Aorta: The aortic root is normal in size and structure. Venous: The inferior vena cava is normal in size with greater than 50% respiratory variability, suggesting right atrial pressure of 3 mmHg. IAS/Shunts: No atrial level shunt detected by color flow Doppler.  LEFT VENTRICLE PLAX 2D LVIDd:         4.20 cm   Diastology LVIDs:         1.80 cm   LV e' medial:    6.20 cm/s LV PW:         1.10 cm   LV E/e' medial:  17.3 LV IVS:        1.00 cm   LV e' lateral:   6.53 cm/s LVOT diam:     1.80 cm   LV E/e' lateral: 16.4 LV SV:         83 LV SV Index:   44 LVOT Area:     2.54 cm  RIGHT VENTRICLE RV S prime:     14.60 cm/s TAPSE (M-mode): 2.4 cm LEFT ATRIUM             Index        RIGHT ATRIUM           Index LA diam:        3.70 cm 1.95 cm/m   RA Area:     15.10 cm LA Vol (A2C):   56.6 ml 29.91 ml/m  RA Volume:   39.60 ml  20.92 ml/m LA Vol (A4C):   49.9 ml 26.37 ml/m LA Biplane Vol: 57.9 ml 30.59 ml/m  AORTIC VALVE AV Area (Vmax):    2.18 cm AV Area (Vmean):   2.25 cm AV Area (VTI):     2.39 cm AV Vmax:           165.60 cm/s AV Vmean:          112.350 cm/s AV VTI:            0.348 m AV Peak Grad:      11.0 mmHg AV Mean Grad:      5.7 mmHg LVOT Vmax:         142.00 cm/s LVOT Vmean:  99.300 cm/s LVOT VTI:          0.327 m LVOT/AV VTI ratio: 0.94  AORTA Ao Root diam: 3.20 cm MITRAL VALVE MV Area (PHT): 3.27 cm     SHUNTS MV Decel Time: 232 msec     Systemic VTI:  0.33 m MV E velocity: 107.00 cm/s  Systemic Diam: 1.80 cm MV A velocity: 95.50 cm/s MV E/A ratio:  1.12 Dina Rich MD Electronically signed by Dina Rich MD Signature Date/Time: 05/19/2022/12:54:30 PM    Final    MR BRAIN WO CONTRAST  Result Date: 05/19/2022 CLINICAL DATA:  Left upper and lower extremity weakness EXAM: MRI HEAD WITHOUT CONTRAST TECHNIQUE: Multiplanar, multiecho pulse sequences of the brain and surrounding  structures were obtained without intravenous contrast. COMPARISON:  Same day CT head FINDINGS: Brain: No acute infarction, hemorrhage, hydrocephalus, extra-axial collection or mass lesion. Vascular: Normal flow voids. Skull and upper cervical spine: Normal marrow signal. Sinuses/Orbits: Polypoid mucosal thickening left maxillary sinus. Other: None IMPRESSION: No acute abnormality. Electronically Signed   By: Lorenza Cambridge M.D.   On: 05/19/2022 10:39   CT ANGIO HEAD NECK W WO CM  Result Date: 05/18/2022 CLINICAL DATA:  Left-sided upper and lower extremity weakness and numbness. EXAM: CT ANGIOGRAPHY HEAD AND NECK WITH AND WITHOUT CONTRAST TECHNIQUE: Multidetector CT imaging of the head and neck was performed using the standard protocol during bolus administration of intravenous contrast. Multiplanar CT image reconstructions and MIPs were obtained to evaluate the vascular anatomy. Carotid stenosis measurements (when applicable) are obtained utilizing NASCET criteria, using the distal internal carotid diameter as the denominator. RADIATION DOSE REDUCTION: This exam was performed according to the departmental dose-optimization program which includes automated exposure control, adjustment of the mA and/or kV according to patient size and/or use of iterative reconstruction technique. CONTRAST:  75mL OMNIPAQUE IOHEXOL 350 MG/ML SOLN COMPARISON:  None Available. FINDINGS: CTA NECK FINDINGS Aortic arch: Two-vessel arch configuration with common origin of the right brachiocephalic and left common carotid arteries. Atherosclerotic calcifications of the aortic arch and arch vessel origins. Arch vessel origins are patent. Right carotid system: No evidence of dissection, stenosis (50% or greater), or occlusion. Mild mixed plaque along the right carotid bulb and proximal right ICA. Left carotid system: No evidence of dissection, stenosis (50% or greater), or occlusion. Mild mixed plaque along the left carotid bulb and proximal  left ICA. Vertebral arteries: Codominant. No evidence of dissection, stenosis (50% or greater), or occlusion. Skeleton: Unremarkable. Other neck: Unremarkable. Upper chest: Unremarkable. Review of the MIP images confirms the above findings CTA HEAD FINDINGS Anterior circulation: Intracranial ICAs are patent without stenosis or aneurysm. The proximal ACAs and MCAs are patent without stenosis or aneurysm. Distal branches are symmetric. Posterior circulation: Normal basilar artery. The SCAs, AICAs and PICAs are patent proximally. The PCAs are patent proximally without stenosis or aneurysm. Distal branches are symmetric. Venous sinuses: As permitted by contrast timing, patent. Anatomic variants: None. Review of the MIP images confirms the above findings IMPRESSION: 1. No large vessel occlusion, hemodynamically significant stenosis, or aneurysm in the head or neck vessels. 2. Aortic atherosclerosis (ICD10-I70.0). Electronically Signed   By: Orvan Falconer M.D.   On: 05/18/2022 21:14   CT HEAD CODE STROKE WO CONTRAST  Result Date: 05/18/2022 CLINICAL DATA:  Code stroke. Neuro deficit, acute, stroke suspected. Left leg and arm weakness. EXAM: CT HEAD WITHOUT CONTRAST TECHNIQUE: Contiguous axial images were obtained from the base of the skull through the vertex without intravenous contrast. RADIATION DOSE REDUCTION:  This exam was performed according to the departmental dose-optimization program which includes automated exposure control, adjustment of the mA and/or kV according to patient size and/or use of iterative reconstruction technique. COMPARISON:  07/02/2009 FINDINGS: Brain: Normal appearance without evidence of old or acute infarction, mass lesion, hemorrhage, hydrocephalus or extra-axial collection. Vascular: No abnormal vascular finding. Skull: Normal Sinuses/Orbits: Clear/normal Other: None ASPECTS (Alberta Stroke Program Early CT Score) - Ganglionic level infarction (caudate, lentiform nuclei, internal  capsule, insula, M1-M3 cortex): 7 - Supraganglionic infarction (M4-M6 cortex): 3 Total score (0-10 with 10 being normal): 10 IMPRESSION: 1. Normal head CT. 2. Aspects is 10. These results were called by telephone at the time of interpretation on 05/18/2022 at 2:51 pm to provider Gloris Manchester , who verbally acknowledged these results. Electronically Signed   By: Paulina Fusi M.D.   On: 05/18/2022 14:52    Scheduled Meds:  aspirin EC  81 mg Oral Daily   [START ON 05/20/2022] atorvastatin  80 mg Oral QPM   busPIRone  5 mg Oral BID   citalopram  20 mg Oral BID   enoxaparin (LOVENOX) injection  40 mg Subcutaneous Q24H   ezetimibe  10 mg Oral Daily   fluticasone furoate-vilanterol  1 puff Inhalation Daily   And   umeclidinium bromide  1 puff Inhalation Daily   nicotine  7 mg Transdermal Daily   Continuous Infusions:   LOS: 0 days   Time spent: 36 mins  Acelin Ferdig Laural Benes, MD How to contact the [facility] Attending or Consulting provider 7A - 7P or covering provider during after hours 7P -7A, for this patient?  Check the care team in [facility] and look for a) attending/consulting TR[facility] provider listed and b) the [facility] team listed Log into [email or web address] and use [facility] universal password to access. If you do not have the password, please contact the hospital operator. Locate the [facility] provider you are looking for under [facility] and page to a number that you can be directly reached. If you still have difficulty reaching the provider, please page the [facility] (Director on Call) for the Hospitalists listed on amion for assistance.  05/19/2022, 2:33 PM

## 2022-05-19 NOTE — Evaluation (Signed)
Physical Therapy Evaluation Patient Details Name: Haley Nielsen MRN: [id number] DOB: 25-Oct-1965 Today's Date: 05/19/2022  History of Present Illness  Merrilee Ancona is a 57 y.o. female with medical history significant for hypertension, asthma, ongoing tobacco abuse, anxiety.  Patient was referred to the ED from outpatient providers office with complaints of sudden onset left-sided upper and lower extremity numbness and weakness.  Patient's went to see her gynecologist earlier today, subsequently went to see an outpatient provider when these symptoms started at about 1:30 PM.  She reports some changes to her vision in both eyes and mild dizziness.  No change in speech.  She is unaware of any facial asymmetry.  Spouse is at bedside.  No history of strokes.  She smokes about 7 cigarettes daily.  Takes aspirin daily. Symptoms have persisted in severity since onset.     Also reports onset of mid low back pain that started about a week ago with radiation down to her left hip and leg.  Pain is constant in intensity with or without ambulation.  No history of back pain.   Clinical Impression  Patient presents with LUE weakness that is new per patient, LLE weakness and severe pain from hip radiating down to left buttock down LLE.  Patient requires increased time with labored movement for sitting up at bedside with difficulty pushing up with LUE, had to lean on armrest of chair during transfers to maintain balance and required use of RW for safety.  Patient demonstrates slow labored cadence with limited weight bearing on LLE due to hip pain and had difficulty advancing LLE due to decreased ankle dorsiflexion/weakness.  Patient had difficulty making it back to bedside due to c/o severe left hip pain and tolerated sitting up in chair after therapy.  Patient will benefit from continued skilled physical therapy in hospital and recommended venue below to increase strength, balance, endurance for safe ADLs and gait.         Recommendations for follow up therapy are one component of a multi-disciplinary discharge planning process, led by the attending physician.  Recommendations may be updated based on patient status, additional functional criteria and insurance authorization.  Follow Up Recommendations       Assistance Recommended at Discharge Set up Supervision/Assistance  Patient can return home with the following  A lot of help with walking and/or transfers;Help with stairs or ramp for entrance;Assistance with cooking/housework;A little help with bathing/dressing/bathroom    Equipment Recommendations Rolling walker (2 wheels)  Recommendations for Other Services       Functional Status Assessment Patient has had a recent decline in their functional status and demonstrates the ability to make significant improvements in function in a reasonable and predictable amount of time.     Precautions / Restrictions Precautions Precautions: Fall Restrictions Weight Bearing Restrictions: No      Mobility  Bed Mobility Overal bed mobility: Needs Assistance Bed Mobility: Supine to Sit     Supine to sit: Supervision, Min guard     General bed mobility comments: increased time, labored movement with diffiuclty using LUE    Transfers Overall transfer level: Needs assistance Equipment used: Rolling walker (2 wheels), None Transfers: Sit to/from Stand, Bed to chair/wheelchair/BSC Sit to Stand: Min guard, Min assist   Step pivot transfers: Min guard, Min assist       General transfer comment: slow labored movement having to lean on armrest of chair for support when transferring without AD, required use of RW for safety    Ambulation/Gait  Ambulation/Gait assistance: Mod assist Gait Distance (Feet): 20 Feet Assistive device: Rolling walker (2 wheels) Gait Pattern/deviations: Decreased step length - right, Decreased step length - left, Decreased stance time - left, Decreased stride length, Antalgic,  Decreased dorsiflexion - left Gait velocity: decreased     General Gait Details: slow labored cadence with poor carryover for left heel to toe stepping due to decreased dorsiflexion and increasing left hip pain, once pain in LLE increased had to sit to avoid loss of balance  Stairs            Wheelchair Mobility    Modified Rankin (Stroke Patients Only)       Balance Overall balance assessment: Needs assistance Sitting-balance support: Feet supported, No upper extremity supported Sitting balance-Leahy Scale: Fair Sitting balance - Comments: fair/good seated at EOB   Standing balance support: During functional activity, No upper extremity supported Standing balance-Leahy Scale: Poor Standing balance comment: fair using RW                             Pertinent Vitals/Pain Pain Assessment Pain Assessment: Faces Faces Pain Scale: Hurts whole lot Pain Location: left hip, buttocks with radiation down LLE Pain Descriptors / Indicators: Radiating, Shooting, Discomfort, Grimacing, Guarding Pain Intervention(s): Limited activity within patient's tolerance, Monitored during session, Repositioned    Home Living Family/patient expects to be discharged to:: Private residence Living Arrangements: Spouse/significant other Available Help at Discharge: Family;Available 24 hours/day Type of Home: Mobile home Home Access: Stairs to enter Entrance Stairs-Rails: Right;Left;Can reach both Entrance Stairs-Number of Steps: 7   Home Layout: One level Home Equipment: None      Prior Function Prior Level of Function : Independent/Modified Independent;Driving             Mobility Comments: Tourist information centre manager, works ADLs Comments: Independent     Hand Dominance   Dominant Hand: Right    Extremity/Trunk Assessment   Upper Extremity Assessment Upper Extremity Assessment: Defer to OT evaluation    Lower Extremity Assessment Lower Extremity Assessment: Generalized  weakness;LLE deficits/detail LLE Deficits / Details: grossly -4/5 LLE: Unable to fully assess due to pain LLE Sensation: decreased light touch;decreased proprioception LLE Coordination: decreased fine motor;decreased gross motor    Cervical / Trunk Assessment Cervical / Trunk Assessment: Normal  Communication   Communication: No difficulties  Cognition Arousal/Alertness: Awake/alert Behavior During Therapy: WFL for tasks assessed/performed Overall Cognitive Status: Within Functional Limits for tasks assessed                                          General Comments      Exercises     Assessment/Plan    PT Assessment Patient needs continued PT services  PT Problem List Decreased strength;Decreased activity tolerance;Decreased balance;Decreased mobility;Pain;Decreased coordination;Impaired sensation       PT Treatment Interventions DME instruction;Gait training;Stair training;Functional mobility training;Therapeutic activities;Patient/family education;Therapeutic exercise;Balance training;Neuromuscular re-education    PT Goals (Current goals can be found in the Care Plan section)  Acute Rehab PT Goals Patient Stated Goal: return home after rehab PT Goal Formulation: With patient Time For Goal Achievement: 06/02/22 Potential to Achieve Goals: Good    Frequency Min 4X/week     Co-evaluation               AM-PAC PT "6 Clicks" Mobility  Outcome Measure Help needed turning  from your back to your side while in a flat bed without using bedrails?: None Help needed moving from lying on your back to sitting on the side of a flat bed without using bedrails?: A Little Help needed moving to and from a bed to a chair (including a wheelchair)?: A Little Help needed standing up from a chair using your arms (e.g., wheelchair or bedside chair)?: A Little Help needed to walk in hospital room?: A Lot Help needed climbing 3-5 steps with a railing? : A Lot 6 Click  Score: 17    End of Session   Activity Tolerance: Patient tolerated treatment well;Patient limited by fatigue;Patient limited by pain Patient left: in chair;with call bell/phone within reach Nurse Communication: Mobility status PT Visit Diagnosis: Unsteadiness on feet (R26.81);Other abnormalities of gait and mobility (R26.89);Muscle weakness (generalized) (M62.81);Hemiplegia and hemiparesis Hemiplegia - Right/Left: Left Hemiplegia - dominant/non-dominant: Non-dominant Hemiplegia - caused by: Unspecified    Time: 1914-7829 PT Time Calculation (min) (ACUTE ONLY): 21 min   Charges:   PT Evaluation $PT Eval Moderate Complexity: 1 Mod PT Treatments $Therapeutic Activity: 8-22 mins        12:37 PM, 05/19/22 Ocie Bob, MPT Physical Therapist with [facility] [phone] office [phone] mobile phone

## 2022-05-19 NOTE — Assessment & Plan Note (Signed)
--   atorvastatin dose timing changed to evening for better lipid lowering effect

## 2022-05-19 NOTE — Hospital Course (Signed)
57 y.o. female with medical history significant for hypertension, asthma, ongoing tobacco abuse, anxiety.  Patient was referred to the ED from outpatient providers office with complaints of sudden onset left-sided upper and lower extremity numbness and weakness.  Patient's went to see her gynecologist earlier today, subsequently went to see an outpatient provider when these symptoms started at about 1:30 PM.  She reports some changes to her vision in both eyes and mild dizziness.  No change in speech.  She is unaware of any facial asymmetry.  Spouse is at bedside.  No history of strokes.  She smokes about 7 cigarettes daily.  Takes aspirin daily. Symptoms have persisted in severity since onset.   Also reports onset of mid low back pain that started about a week ago with radiation down to her left hip and leg.  Pain is constant in intensity with or without ambulation.  No history of back pain.    ED Course: Blood pressure 149/83.  Heart rate 73.  Temperature 98.  Code stroke called.  Head CT negative for acute abnormality.  Evaluated by teleneurologist Dr. Otelia Limes.  Patient declined TNK.  Admission for stroke workup requested.

## 2022-05-19 NOTE — Progress Notes (Signed)
Inpatient Rehabilitation Admissions Coordinator   Medical workup noted per Dr Laural Benes. Noted back pain and to follow up with Ortho spine.No medical need for hospital level rehab noted at this time. Other rehab venues should be pursued. I have alerted acute team and TOC. We are not pursuing CIR admit.  Ottie Glazier, RN, MSN Rehab Admissions Coordinator [phone] 05/19/2022 2:44 PM

## 2022-05-19 NOTE — Assessment & Plan Note (Signed)
-   diet controlled

## 2022-05-19 NOTE — TOC Transition Note (Signed)
Transition of Care [facility]) - CM/SW Discharge Note   Patient Details  Name: Haley Nielsen MRN: [id number] Date of Birth: 06/28/1965  [facility] CM/SW Contact:  Karn Cassis, LCSW Phone Number: 05/19/2022, 3:08 PM   Clinical Narrative: Pt is not CIR candidate per Britta Mccreedy at [facility]. Discussed therapy options with pt including home health, outpatient, and SNF. Pt is not interested in SNF. She requests outpatient rehab at [facility]. Orders in for OOPT/OT. PT also recommending rolling walker. Pt has no preference on agency. Referred to [facility] with [employer] to drop ship to home. Pt d/c today.       Final next level of care: OP Rehab Barriers to Discharge: Barriers Resolved   Patient Goals and CMS Choice   Choice offered to / list presented to : Patient, Spouse  Discharge Placement                      Patient and family notified of of transfer: 05/19/22  Discharge Plan and Services Additional resources added to the After Visit Summary for                  DME Arranged: Walker rolling DME Agency: [employer] Date DME Agency Contacted: 05/19/22 Time DME Agency Contacted: [phone] Representative spoke with at DME Agency: Barbara Cower            Social Determinants of Health (SDOH) Interventions SDOH Screenings   Food Insecurity: No Food Insecurity (05/18/2022)  Housing: Low Risk  (05/18/2022)  Transportation Needs: No Transportation Needs (05/18/2022)  Utilities: Not At Risk (05/18/2022)  Depression (PHQ2-9): High Risk (05/18/2022)  Tobacco Use: High Risk (05/18/2022)     Readmission Risk Interventions     No data to display

## 2022-05-20 DIAGNOSIS — R531 Weakness: Secondary | ICD-10-CM | POA: Diagnosis not present

## 2022-05-22 ENCOUNTER — Telehealth: Payer: Self-pay | Admitting: Family Medicine

## 2022-05-22 NOTE — Telephone Encounter (Signed)
Pt aware letter written and up front for pt's husband to pick up.

## 2022-05-27 ENCOUNTER — Ambulatory Visit (INDEPENDENT_AMBULATORY_CARE_PROVIDER_SITE_OTHER): Payer: BC Managed Care – PPO

## 2022-05-27 ENCOUNTER — Ambulatory Visit: Payer: BC Managed Care – PPO | Admitting: Family Medicine

## 2022-05-27 ENCOUNTER — Encounter: Payer: Self-pay | Admitting: Family Medicine

## 2022-05-27 VITALS — BP 160/94 | HR 100 | Temp 97.1°F | Ht 64.0 in

## 2022-05-27 DIAGNOSIS — R200 Anesthesia of skin: Secondary | ICD-10-CM

## 2022-05-27 DIAGNOSIS — I10 Essential (primary) hypertension: Secondary | ICD-10-CM

## 2022-05-27 DIAGNOSIS — R202 Paresthesia of skin: Secondary | ICD-10-CM

## 2022-05-27 DIAGNOSIS — M50322 Other cervical disc degeneration at C5-C6 level: Secondary | ICD-10-CM | POA: Diagnosis not present

## 2022-05-27 DIAGNOSIS — M5442 Lumbago with sciatica, left side: Secondary | ICD-10-CM | POA: Diagnosis not present

## 2022-05-27 DIAGNOSIS — R531 Weakness: Secondary | ICD-10-CM | POA: Diagnosis not present

## 2022-05-27 DIAGNOSIS — M5126 Other intervertebral disc displacement, lumbar region: Secondary | ICD-10-CM | POA: Diagnosis not present

## 2022-05-27 MED ORDER — GABAPENTIN 100 MG PO CAPS
300.0000 mg | ORAL_CAPSULE | Freq: Three times a day (TID) | ORAL | 0 refills | Status: DC
Start: 2022-05-27 — End: 2022-06-25

## 2022-05-27 MED ORDER — METHOCARBAMOL 500 MG PO TABS
500.0000 mg | ORAL_TABLET | Freq: Four times a day (QID) | ORAL | 1 refills | Status: DC
Start: 2022-05-27 — End: 2022-07-09

## 2022-05-27 NOTE — Progress Notes (Unsigned)
Established Patient Office Visit  Subjective   Patient ID: Haley Nielsen, female    DOB: March 27, 1965  Age: 57 y.o. MRN: [id number]  Chief Complaint  Patient presents with   Hospitalization Follow-up    HPI Haley Nielsen is here for a hospital follow up. She was admitted for left sided weakness and discharged from [facility] on 05/19/22. CVA was ruled out by CT and MRI. CTA of head and neck was negative for acute findings. She was instructed to conitnue aspirin 81 mg daily and atorvastatin 80 mg daily. MRI of lumbar spince showed disc protrusion at L4-5. Recent MRI of of  pelvis and abdominal/pelvic CT were negative for acute findings. She was given a 1 time dose of prednisone. A referral to PT and OT was placed. She has these appointments schedule. She needs a referral to neurology outpatient and ortho.   She continues to left lower back pain that radiates around her groin and down the side of her left leg. She is having tingling in her left leg. She has had trouble walking due to this and has had 2 falls. She feels like the leg is weak. She also reports continued tingling in her left arm with slight weakness.   She has not been checking her BP at home. She denies chest pain, shortness of breath, or edema.    Past Medical History:  Diagnosis Date   Allergic rhinitis    Allergy    Blurry vision    Bronchospasm    Chest pain    Dizziness    Elevated CK-MB level    GERD (gastroesophageal reflux disease)    Hyperlipidemia    Hypertension    Neuropathy 05/15/2021   Syncope    Syncope    Tobacco abuse    Weakness       ROS As per HPI.    Objective:     BP (!) 160/94   Pulse 100   Temp (!) 97.1 F (36.2 C) (Temporal)   Ht 5\' 4"  (1.626 m)   SpO2 98%   BMI 31.76 kg/m    Physical Exam Vitals and nursing note reviewed.  Constitutional:      General: She is not in acute distress.    Appearance: She is not ill-appearing, toxic-appearing or diaphoretic.  HENT:     Mouth/Throat:      Mouth: Mucous membranes are moist.     Pharynx: Oropharynx is clear.  Eyes:     Extraocular Movements: Extraocular movements intact.     Pupils: Pupils are equal, round, and reactive to light.  Neck:     Vascular: No carotid bruit.  Cardiovascular:     Rate and Rhythm: Normal rate and regular rhythm.     Heart sounds: Normal heart sounds. No murmur heard. Pulmonary:     Effort: Pulmonary effort is normal. No respiratory distress.     Breath sounds: Normal breath sounds. No wheezing or rhonchi.  Abdominal:     General: Bowel sounds are normal. There is no distension.     Palpations: Abdomen is soft.     Tenderness: There is no abdominal tenderness.  Musculoskeletal:        General: No swelling or tenderness.     Cervical back: Normal range of motion and neck supple. No rigidity or tenderness.     Right lower leg: No edema.     Left lower leg: No edema.  Skin:    General: Skin is warm and dry.  Neurological:  Mental Status: She is alert and oriented to person, place, and time.     Cranial Nerves: No facial asymmetry.     Sensory: Sensation is intact.     Motor: Weakness (4/5 LUE and LLE) present.     Gait: Gait abnormal (antalgic gait).  Psychiatric:        Mood and Affect: Mood normal.        Behavior: Behavior normal.      No results found for any visits on 05/27/22.    The 10-year ASCVD risk score (Arnett DK, et al., 2019) is: 17.9%    Assessment & Plan:   Earlyne was seen today for hospitalization follow-up.  Diagnoses and all orders for this visit:  Left-sided weakness -- CVA ruled out Reviewed hospital discharge summary, labs, imaging reports.  -     Ambulatory referral to Neurology  Numbness and tingling of left arm and leg Xray today of C-spine. ? Cervical and lumbar radiculopathy or neuropathy. Referral to ortho and neurology placed. Will start gabapentin.  -     Ambulatory referral to Neurology -     Ambulatory referral to Orthopedic Surgery -      gabapentin (NEURONTIN) 100 MG capsule; Take 3 capsules (300 mg total) by mouth 3 (three) times daily. -     DG Cervical Spine 2 or 3 views; Future  Acute left-sided low back pain with left-sided sciatica Protrusion of lumbar intervertebral disc Complete PT and OT as ordered.  -     Ambulatory referral to Orthopedic Surgery -     gabapentin (NEURONTIN) 100 MG capsule; Take 3 capsules (300 mg total) by mouth 3 (three) times daily. -     methocarbamol (ROBAXIN) 500 MG tablet; Take 1 tablet (500 mg total) by mouth 4 (four) times daily.  Primary hypertension BP is elevated today. Was well controlled at discharge. Monitor BP at home and notify for elevated home readings. Discussed with adjust medications if BP is elevated.   Will complete FMLA forms for patient when received. She has to walk/stand for long periods at work and has not been able to due to due to lumbar radiculopathy symptoms.   Return in about 4 weeks (around 06/24/2022) for follow up.   The patient indicates understanding of these issues and agrees with the plan.  Haley Earing, FNP

## 2022-05-27 NOTE — Patient Instructions (Signed)
Take 100 mg of gabapentin at bedtime tonight. Tomorrow take 100 mg three times a day. After 2 days, take 200 mg three times a day. After another 2 days, take 300 mg three times a day.

## 2022-05-28 ENCOUNTER — Encounter: Payer: Self-pay | Admitting: Family Medicine

## 2022-05-29 ENCOUNTER — Ambulatory Visit: Payer: BC Managed Care – PPO | Admitting: Family Medicine

## 2022-06-01 ENCOUNTER — Ambulatory Visit
Admission: RE | Admit: 2022-06-01 | Discharge: 2022-06-01 | Disposition: A | Discharge status: Home / Self Care | Payer: BC Managed Care – PPO | Source: Ambulatory Visit | Attending: Family Medicine | Admitting: Family Medicine

## 2022-06-01 DIAGNOSIS — Z1231 Encounter for screening mammogram for malignant neoplasm of breast: Secondary | ICD-10-CM | POA: Diagnosis not present

## 2022-06-03 ENCOUNTER — Ambulatory Visit: Payer: BC Managed Care – PPO | Admitting: Family Medicine

## 2022-06-03 ENCOUNTER — Encounter: Payer: Self-pay | Admitting: Family Medicine

## 2022-06-03 VITALS — BP 129/89 | HR 96 | Temp 98.1°F | Ht 64.0 in | Wt 185.0 lb

## 2022-06-03 DIAGNOSIS — M5442 Lumbago with sciatica, left side: Secondary | ICD-10-CM | POA: Diagnosis not present

## 2022-06-03 DIAGNOSIS — R200 Anesthesia of skin: Secondary | ICD-10-CM | POA: Diagnosis not present

## 2022-06-03 DIAGNOSIS — R202 Paresthesia of skin: Secondary | ICD-10-CM

## 2022-06-03 DIAGNOSIS — F320 Major depressive disorder, single episode, mild: Secondary | ICD-10-CM

## 2022-06-03 DIAGNOSIS — F419 Anxiety disorder, unspecified: Secondary | ICD-10-CM

## 2022-06-03 DIAGNOSIS — M5030 Other cervical disc degeneration, unspecified cervical region: Secondary | ICD-10-CM

## 2022-06-03 MED ORDER — DULOXETINE HCL 30 MG PO CPEP
30.0000 mg | ORAL_CAPSULE | Freq: Every day | ORAL | 3 refills | Status: DC
Start: 2022-06-03 — End: 2022-07-03

## 2022-06-03 MED ORDER — KETOROLAC TROMETHAMINE 30 MG/ML IJ SOLN
30.0000 mg | Freq: Once | INTRAMUSCULAR | Status: AC
Start: 2022-06-03 — End: 2022-06-03
  Administered 2022-06-03: 30 mg via INTRAMUSCULAR

## 2022-06-03 NOTE — Addendum Note (Signed)
Addended by: Gabriel Earing on: 06/03/2022 01:45 PM   Modules accepted: Level of Service

## 2022-06-03 NOTE — Patient Instructions (Addendum)
Call [facility] [phone].  Hold naproxen today, restart tomorrow.   Stop citalopram and start duloxetine.

## 2022-06-03 NOTE — Progress Notes (Addendum)
Established Patient Office Visit  Subjective   Patient ID: Haley Nielsen, female    DOB: Aug 23, 1965  Age: 57 y.o. MRN: [id number]  Chief Complaint  Patient presents with   Depression   Anxiety    HPI Haley Nielsen is here for a follow up of anxiety and depression. Reports celexa and buspar are helping some but her symptoms have worsened due to her pain and limited ability to do her normal activities. Denies SI, plan, or intent.   She is still having a lot of pain in her back and left leg. Has pins and needle sensation that comes and goes. Tingling and numbness has been improving though she still has some in her toes. Continues to have some tingling/numbness in left arm but this has been improving. Cervical xray showed mild DDD at C5-6. Denies side effects with gabapentin.   Denies chest pain, shortness of breath, edema, or orthopnea. Denies dizziness or palpitations.       06/03/2022   10:21 AM 05/27/2022    2:30 PM 05/18/2022    1:19 PM  Depression screen PHQ 2/9  Decreased Interest 2 2 2   Down, Depressed, Hopeless 2 1 2   PHQ - 2 Score 4 3 4   Altered sleeping 3 1 2   Tired, decreased energy 3 2 2   Change in appetite 2 0 1  Feeling bad or failure about yourself  2 0 0  Trouble concentrating 2 0 3  Moving slowly or fidgety/restless 2 0 0  Suicidal thoughts 0 0 0  PHQ-9 Score 18 6 12   Difficult doing work/chores Somewhat difficult Not difficult at all Somewhat difficult      06/03/2022   10:21 AM 05/27/2022    2:30 PM 05/18/2022    1:20 PM 05/01/2022   10:19 AM  GAD 7 : Generalized Anxiety Score  Nervous, Anxious, on Edge 1 0 1 0  Control/stop worrying 2 0 1 2  Worry too much - different things 2 1 1 2   Trouble relaxing 2 1 2 2   Restless 1 1 0 1  Easily annoyed or irritable 1 0 0 0  Afraid - awful might happen 0 0 0 1  Total GAD 7 Score 9 3 5 8   Anxiety Difficulty Somewhat difficult Not difficult at all Very difficult Somewhat difficult       ROS As per HPI.    Objective:      BP 129/89 Comment: at home reading per pt  Pulse 96   Temp 98.1 F (36.7 C) (Temporal)   Ht 5\' 4"  (1.626 m)   Wt 185 lb (83.9 kg)   SpO2 97%   BMI 31.76 kg/m  BP Readings from Last 3 Encounters:  06/03/22 129/89  05/27/22 (!) 160/94  05/19/22 (!) 151/74      Physical Exam Vitals and nursing note reviewed.  Constitutional:      General: She is not in acute distress.    Appearance: She is not ill-appearing, toxic-appearing or diaphoretic.  HENT:     Mouth/Throat:     Mouth: Mucous membranes are moist.     Pharynx: Oropharynx is clear.  Eyes:     Extraocular Movements: Extraocular movements intact.     Pupils: Pupils are equal, round, and reactive to light.  Neck:     Vascular: No carotid bruit.  Cardiovascular:     Rate and Rhythm: Regular rhythm.     Heart sounds: Normal heart sounds. No murmur heard. Pulmonary:     Effort: Pulmonary effort is  normal. No respiratory distress.     Breath sounds: Normal breath sounds. No wheezing or rhonchi.  Abdominal:     General: Bowel sounds are normal. There is no distension.     Palpations: Abdomen is soft.     Tenderness: There is no abdominal tenderness.  Musculoskeletal:        General: No swelling or tenderness.     Cervical back: Normal range of motion and neck supple. No rigidity or tenderness.     Right lower leg: No edema.     Left lower leg: No edema.  Skin:    General: Skin is warm and dry.  Neurological:     Mental Status: She is alert and oriented to person, place, and time.     Cranial Nerves: No facial asymmetry.     Sensory: Sensation is intact.     Motor: Weakness (4/5 LUE and LLE) present.     Gait: Gait abnormal (antalgic gait).  Psychiatric:        Mood and Affect: Mood normal.        Behavior: Behavior normal.      No results found for any visits on 06/03/22.    The 10-year ASCVD risk score (Arnett DK, et al., 2019) is: 9.2%    Assessment & Plan:   Haley Nielsen was seen today for depression  and anxiety.  Diagnoses and all orders for this visit:  Acute left-sided low back pain with left-sided sciatica Numbness and tingling of left arm and leg DDD (degenerative disc disease), cervical Toradol IM today in office. Hold NSAIDs for today. Contact information given for ortho referral. Continue gabapentin. Discussed cervical xray with mild DDD that could be cause numbness and tingling in left arm. She would also like to transition to cymbalta to see if this would help with pain.  -     ketorolac (TORADOL) 30 MG/ML injection 30 mg  Depression, major, single episode, mild (HCC) Anxiety Uncontrolled. Denies SI. Discussed swtich from celexa to cymbalta. Continue buspar.  -     DULoxetine (CYMBALTA) 30 MG capsule; Take 1 capsule (30 mg total) by mouth daily.   Return if symptoms worsen or fail to improve. Keep scheduled follow up appt.   The patient indicates understanding of these issues and agrees with the plan.    Gabriel Earing, FNP

## 2022-06-05 ENCOUNTER — Telehealth: Payer: Self-pay | Admitting: Family Medicine

## 2022-06-05 DIAGNOSIS — Z0289 Encounter for other administrative examinations: Secondary | ICD-10-CM

## 2022-06-05 NOTE — Telephone Encounter (Signed)
Patient paid for FMLA paperwork today 06/05/22 - Paperwork put in HH/RX coordinator box for processing.

## 2022-06-05 NOTE — Telephone Encounter (Signed)
[employer] faxed FMLA forms to be completed  Form Fee Paid? (Y/N)  NO /CALLED PT AND SHE SAID SHE WAS COMING TO PAY     If NO, form is placed on front office manager desk to hold until payment received. If YES, then form will be placed in the RX/HH Nurse Coordinators box for completion.  Form will not be processed until payment is received

## 2022-06-08 ENCOUNTER — Telehealth: Payer: Self-pay | Admitting: Family Medicine

## 2022-06-08 NOTE — Telephone Encounter (Signed)
Pt is being told by [facility] pharmacy that they can't fill her BP medicine because it requires authorization first. Pt says she has been trying to get her BP medicine filled for at least a week because she is completely out. Please advise.

## 2022-06-08 NOTE — Telephone Encounter (Signed)
Information completed and forwarded to PCP 

## 2022-06-09 NOTE — Telephone Encounter (Signed)
Spoke to pharmacy and no PA needed. Rx is getting filled - patient aware

## 2022-06-17 ENCOUNTER — Ambulatory Visit (HOSPITAL_COMMUNITY): Payer: BC Managed Care – PPO

## 2022-06-17 ENCOUNTER — Encounter (HOSPITAL_COMMUNITY): Payer: BC Managed Care – PPO | Admitting: Occupational Therapy

## 2022-06-18 NOTE — Telephone Encounter (Signed)
PCP completed and signed Disability forms. They have been faxed to [employer] at fax number [phone]. LMOVM to patient to inform them they are complete and copy is at front desk ready for pick up

## 2022-06-22 ENCOUNTER — Telehealth: Payer: Self-pay | Admitting: Family Medicine

## 2022-06-24 ENCOUNTER — Encounter: Payer: Self-pay | Admitting: Family Medicine

## 2022-06-24 ENCOUNTER — Ambulatory Visit: Payer: BC Managed Care – PPO | Admitting: Family Medicine

## 2022-06-24 VITALS — BP 192/103 | HR 97 | Temp 97.8°F | Ht 64.0 in | Wt 191.0 lb

## 2022-06-24 DIAGNOSIS — R079 Chest pain, unspecified: Secondary | ICD-10-CM | POA: Diagnosis not present

## 2022-06-24 DIAGNOSIS — R531 Weakness: Secondary | ICD-10-CM | POA: Diagnosis not present

## 2022-06-24 DIAGNOSIS — M5442 Lumbago with sciatica, left side: Secondary | ICD-10-CM

## 2022-06-24 DIAGNOSIS — I10 Essential (primary) hypertension: Secondary | ICD-10-CM

## 2022-06-24 MED ORDER — AMLODIPINE BESYLATE 5 MG PO TABS
5.0000 mg | ORAL_TABLET | Freq: Every day | ORAL | 3 refills | Status: DC
Start: 2022-06-24 — End: 2022-07-03

## 2022-06-24 NOTE — Progress Notes (Signed)
Established Patient Office Visit  Subjective   Patient ID: Haley Nielsen, female    DOB: [birth year]  Age: 57 y.o. MRN: [id number]  Chief Complaint  Patient presents with   Follow-up    4 wk f/u bp? Per pt don't feel good, feels a little light-headed    HPI Fergie is here for a follow up.   She has been taking her medication as prescribed. Her BP has been elevated at home as well, sometimes up to 200 systolic for the last week. She reports chest pain that lasts for 3-4 minutes at a time on the left side of her chest. Pain does not radiate. This occurs intermittently for the last week. This improves with rest and worsens with activity. Reports dyspnea that has been intermittent for the last week. She has been having headaches as well. Denies new focal weakness, edema, orthopnea, confusion, visual disturbances.   Report back pain has improved overall. She continues to have pain in her back that radiates down her left leg, thigh, and into her left groin. This left side continues to feel weak. She has been able to walk though but fatigues easily. Numbness and tingling has improved but continues in LUE and LLE. She has an ortho appt on 07/28/22 for evaluation. She will start PT next week. She has to reschedule her original PT appt due to lack of transportation.   Past Medical History:  Diagnosis Date   Allergic rhinitis    Allergy    Blurry vision    Bronchospasm    Chest pain    Dizziness    Elevated CK-MB level    GERD (gastroesophageal reflux disease)    Hyperlipidemia    Hypertension    Neuropathy 05/15/2021   Syncope    Syncope    Tobacco abuse    Weakness       ROS As per HPI.   Objective:     BP (!) 192/103   Pulse 97   Temp 97.8 F (36.6 C) (Oral)   Ht 5\' 4"  (1.626 m)   Wt 191 lb (86.6 kg)   SpO2 99%   BMI 32.79 kg/m  BP Readings from Last 3 Encounters:  06/24/22 (!) 190/100  06/03/22 129/89  05/27/22 (!) 160/94      Physical Exam Vitals and nursing note  reviewed.  Constitutional:      General: She is not in acute distress.    Appearance: She is not ill-appearing, toxic-appearing or diaphoretic.  HENT:     Mouth/Throat:     Mouth: Mucous membranes are moist.     Pharynx: Oropharynx is clear.  Eyes:     General:        Right eye: No discharge.        Left eye: No discharge.     Extraocular Movements: Extraocular movements intact.     Pupils: Pupils are equal, round, and reactive to light.  Neck:     Vascular: No carotid bruit.  Cardiovascular:     Rate and Rhythm: Regular rhythm.     Heart sounds: Normal heart sounds. No murmur heard. Pulmonary:     Effort: Pulmonary effort is normal. No respiratory distress.     Breath sounds: Normal breath sounds. No wheezing or rhonchi.  Abdominal:     General: Bowel sounds are normal. There is no distension.     Palpations: Abdomen is soft.     Tenderness: There is no abdominal tenderness.  Musculoskeletal:  General: No swelling or tenderness.     Cervical back: Normal range of motion and neck supple. No rigidity or tenderness.     Right lower leg: No edema.     Left lower leg: No edema.  Skin:    General: Skin is warm and dry.  Neurological:     Mental Status: She is alert and oriented to person, place, and time.     Cranial Nerves: No facial asymmetry.     Sensory: Sensation is intact.     Motor: Weakness (4/5 LUE and LLE) present.     Gait: Gait abnormal (antalgic gait).  Psychiatric:        Mood and Affect: Mood normal.        Behavior: Behavior normal.      No results found for any visits on 06/24/22.    The 10-year ASCVD risk score (Arnett DK, et al., 2019) is: 29.3%    Assessment & Plan:   Taigan was seen today for follow-up.  Diagnoses and all orders for this visit:  Primary hypertension Chest pain, unspecified type Uncontrolled HTN. EKG with NSR today. Add amlodipine. Reports intermittent chest pain x 1 week, improves with rest, worse with activity. Urgent  referral to cardiology for further work up. Discussed when to seek emergency care.  -     EKG 12-Lead -     amLODipine (NORVASC) 5 MG tablet; Take 1 tablet (5 mg total) by mouth daily. -     Ambulatory referral to Cardiology  Acute left-sided low back pain with left-sided sciatica Left-sided weakness -- CVA ruled out Improving pain, continues to have weakness on left side. Complete PT. Keep ortho appt for further evaluation and management.    Return in about 1 week (around 07/01/2022) for BP.   The patient indicates understanding of these issues and agrees with the plan.  Gabriel Earing, FNP

## 2022-06-25 ENCOUNTER — Other Ambulatory Visit: Payer: Self-pay | Admitting: Family Medicine

## 2022-06-25 ENCOUNTER — Ambulatory Visit: Payer: BC Managed Care – PPO | Admitting: Nurse Practitioner

## 2022-06-25 ENCOUNTER — Encounter: Payer: Self-pay | Admitting: Nurse Practitioner

## 2022-06-25 ENCOUNTER — Ambulatory Visit (INDEPENDENT_AMBULATORY_CARE_PROVIDER_SITE_OTHER): Payer: BC Managed Care – PPO | Admitting: Internal Medicine

## 2022-06-25 VITALS — BP 158/96 | HR 93 | Temp 98.4°F | Ht 64.0 in | Wt 190.2 lb

## 2022-06-25 DIAGNOSIS — J418 Mixed simple and mucopurulent chronic bronchitis: Secondary | ICD-10-CM | POA: Diagnosis not present

## 2022-06-25 DIAGNOSIS — J3089 Other allergic rhinitis: Secondary | ICD-10-CM

## 2022-06-25 DIAGNOSIS — K219 Gastro-esophageal reflux disease without esophagitis: Secondary | ICD-10-CM

## 2022-06-25 DIAGNOSIS — J4530 Mild persistent asthma, uncomplicated: Secondary | ICD-10-CM

## 2022-06-25 DIAGNOSIS — R0609 Other forms of dyspnea: Secondary | ICD-10-CM

## 2022-06-25 DIAGNOSIS — R200 Anesthesia of skin: Secondary | ICD-10-CM

## 2022-06-25 DIAGNOSIS — F17200 Nicotine dependence, unspecified, uncomplicated: Secondary | ICD-10-CM | POA: Diagnosis not present

## 2022-06-25 DIAGNOSIS — J309 Allergic rhinitis, unspecified: Secondary | ICD-10-CM

## 2022-06-25 DIAGNOSIS — J4540 Moderate persistent asthma, uncomplicated: Secondary | ICD-10-CM | POA: Diagnosis not present

## 2022-06-25 DIAGNOSIS — M5442 Lumbago with sciatica, left side: Secondary | ICD-10-CM

## 2022-06-25 DIAGNOSIS — R053 Chronic cough: Secondary | ICD-10-CM

## 2022-06-25 LAB — PULMONARY FUNCTION TEST
DL/VA % pred: 82 %
DL/VA: 3.48 ml/min/mmHg/L
DLCO cor % pred: 73 %
DLCO cor: 14.96 ml/min/mmHg
DLCO unc % pred: 75 %
DLCO unc: 15.49 ml/min/mmHg
FEF 25-75 Post: 1.64 L/sec
FEF 25-75 Pre: 1.79 L/sec
FEF2575-%Change-Post: -8 %
FEF2575-%Pred-Post: 66 %
FEF2575-%Pred-Pre: 72 %
FEV1-%Change-Post: -3 %
FEV1-%Pred-Post: 68 %
FEV1-%Pred-Pre: 71 %
FEV1-Post: 1.82 L
FEV1-Pre: 1.87 L
FEV1FVC-%Change-Post: -3 %
FEV1FVC-%Pred-Pre: 103 %
FEV6-%Change-Post: 0 %
FEV6-%Pred-Post: 70 %
FEV6-%Pred-Pre: 70 %
FEV6-Post: 2.3 L
FEV6-Pre: 2.3 L
FEV6FVC-%Pred-Post: 103 %
FEV6FVC-%Pred-Pre: 103 %
FVC-%Change-Post: 0 %
FVC-%Pred-Post: 68 %
FVC-%Pred-Pre: 68 %
FVC-Post: 2.3 L
FVC-Pre: 2.3 L
Post FEV1/FVC ratio: 79 %
Post FEV6/FVC ratio: 100 %
Pre FEV1/FVC ratio: 81 %
Pre FEV6/FVC Ratio: 100 %
RV % pred: 101 %
RV: 1.95 L
TLC % pred: 100 %
TLC: 5.06 L

## 2022-06-25 MED ORDER — MONTELUKAST SODIUM 10 MG PO TABS
10.0000 mg | ORAL_TABLET | Freq: Every day | ORAL | 5 refills | Status: DC
Start: 2022-06-25 — End: 2022-12-31

## 2022-06-25 NOTE — Patient Instructions (Addendum)
Continue Albuterol inhaler 2 puffs every 6 hours as needed for shortness of breath or wheezing. Notify if symptoms persist despite rescue inhaler/neb use. Continue Trelegy 1 puff daily until you run out then switch to Breztri 2 puffs Twice daily with a spacer. Brush tongue and rinse mouth afterwards. If this works better for you or reduces your coughing, then we may consider changing your prescription over  Continue flonase nasal spray 2 sprays each nostril daily Continue omeprazole 1 capsule daily  Guaifenesin (mucinex) 600 mg 1-2 times a day for cough/congestion Singulair (montelukast) 1 tab At bedtime for allergies/asthma  CT chest ordered - someone will contact you to schedule this  Follow up in 8 weeks with Dr. Sherene Sires or Florentina Addison Nizar Cutler,NP to see how inhaler change/new medications are working and review CT. If symptoms do not improve or worsen, please contact office for sooner follow up or seek emergency care.

## 2022-06-25 NOTE — Progress Notes (Signed)
@Patient  ID: Haley Nielsen, female    DOB: Jul 02, 1965, 57 y.o.   MRN: [id number]  Chief Complaint  Patient presents with   Follow-up    Pft review, pt does have a cough.     Referring provider: Gabriel Earing, FNP  HPI: 57 year old female, active smoker followed for asthma and UACS. She is a patient of Dr. Thurston Hole and last seen in office 12/01/2021. Past medical history significant for HTN, GERD, vasovagal syncope, HLD, obesity, prediabetes, anxiety.   TEST/EVENTS:  12/01/2021 CXR: linear scarring in the lingula; lungs otherwise clear  04/2022 eos 300  06/25/2022 PFT: FVC 68, FEV1 71, ratio 79, TLC 100, DLCOcor 73.  No BD.  Moderate diffusing defect  12/01/2021: OV with Dr. Sherene Sires for pulmonary consult.  Active smoker.  Has chronic problems with rhinitis/cough which is worse with pollen.  Did not use any inhalers in high school but has as an adult.  Referred given concern for COPD.  Does have to stop frequently when walking up a hill.  Has not noticed a huge difference on the Trelegy.  Cough is better on Trelegy.  Continues to have globus sensation.  Typically uses [place] when working.  Question asthma given timeframe with onset in childhood.  Plan to discontinue ACE inhibitor due to hoarseness and upper airway pattern cough.  Question if DPI is contributing but she feels better on this so far.  Will set her up for PFTs for further evaluation.  Also encouraged her to work on smoking cessation.  On PPI daily for GERD.  Previous eosinophils 400; continue Claritin.  06/25/2022: Today-follow-up Patient resents today for follow-up after undergoing pulmonary function testing.  She did use her Trelegy a couple hours prior to the testing.  She had reduced lung function but no formal obstruction.  No evidence of a restrictive disease.  Did have a moderate diffusing defect.  There was no reversibility after bronchodilator therapy.  She tells me today that she is feeling overall similar to how she did when she  was here last time.  She does feel like the Trelegy has helped her but she continues to have a daily cough, which is occasionally productive with cream colored sputum.  She also has some voice hoarseness that is relatively unchanged.  Using her rescue inhaler about once a day.  She does feel like the Trelegy helps her breathing.  She denies any wheezing, fevers, chills, orthopnea, lower extremity swelling.  She does have chronic issues with her sinuses.  She is on Claritin.  She also uses Flonase nasal spray.  Has never had allergy testing before.  She has about 40 days left of her Trelegy.  She would like to continue this before switching to something else since it is already paid for.  FeNO 7 ppb   Allergies  Allergen Reactions   Penicillins Hives    Immunization History  Administered Date(s) Administered   Influenza-Unspecified 11/10/2021   Tdap 07/21/2006, 05/21/2018, 12/26/2019   Zoster Recombinat (Shingrix) 05/22/2021, 04/02/2022    Past Medical History:  Diagnosis Date   Allergic rhinitis    Allergy    Blurry vision    Bronchospasm    Chest pain    Dizziness    Elevated CK-MB level    GERD (gastroesophageal reflux disease)    Hyperlipidemia    Hypertension    Neuropathy 05/15/2021   Syncope    Syncope    Tobacco abuse    Weakness     Tobacco History:  Social History   Tobacco Use  Smoking Status Every Day   Packs/day: 0.50   Years: 15.00   Additional pack years: 0.00   Total pack years: 7.50   Types: Cigarettes  Smokeless Tobacco Never   Ready to quit: Not Answered Counseling given: Not Answered   Outpatient Medications Prior to Visit  Medication Sig Dispense Refill   amLODipine (NORVASC) 5 MG tablet Take 1 tablet (5 mg total) by mouth daily. 90 tablet 3   aspirin EC 81 MG tablet Take 1 tablet (81 mg total) by mouth daily. Swallow whole. 90 tablet 3   atorvastatin (LIPITOR) 80 MG tablet Take 1 tablet (80 mg total) by mouth every evening. Keep on file 90  tablet 3   busPIRone (BUSPAR) 5 MG tablet Take 1 tablet (5 mg total) by mouth 2 (two) times daily. 180 tablet 1   cyclobenzaprine (FLEXERIL) 5 MG tablet Take 1 tablet (5 mg total) by mouth 3 (three) times daily as needed for muscle spasms. DO NOT DRIVE OR OPERATE MACHINERY ON THIS MEDICATION 30 tablet 0   DULoxetine (CYMBALTA) 30 MG capsule Take 1 capsule (30 mg total) by mouth daily. 90 capsule 3   EQ ALL DAY ALLERGY RELIEF 10 MG tablet Take 1 tablet by mouth once daily 90 tablet 0   ezetimibe (ZETIA) 10 MG tablet Take 1 tablet (10 mg total) by mouth daily. 90 tablet 3   fluticasone (FLONASE) 50 MCG/ACT nasal spray Place 2 sprays into both nostrils daily. 16 g 6   Fluticasone-Umeclidin-Vilant (TRELEGY ELLIPTA) 100-62.5-25 MCG/ACT AEPB Inhale 1 puff into the lungs daily. 1 each 11   gabapentin (NEURONTIN) 100 MG capsule Take 3 capsules (300 mg total) by mouth 3 (three) times daily. 810 capsule 0   lidocaine (LIDODERM) 5 % Place 1 patch onto the skin daily. Remove & Discard patch within 12 hours or as directed by MD 30 patch 0   methocarbamol (ROBAXIN) 500 MG tablet Take 1 tablet (500 mg total) by mouth 4 (four) times daily. 30 tablet 1   Multiple Vitamin (MULTIVITAMIN WITH MINERALS) TABS tablet Take 1 tablet by mouth daily.     naproxen (NAPROSYN) 500 MG tablet Take 500 mg by mouth 2 (two) times daily.     olmesartan-hydrochlorothiazide (BENICAR HCT) 20-12.5 MG tablet Take 1 tablet by mouth daily. 90 tablet 3   Omega-3 Fatty Acids (FISH OIL) 1000 MG CAPS Take by mouth.     omeprazole (PRILOSEC) 20 MG capsule Take 1 capsule by mouth once daily 90 capsule 0   VENTOLIN HFA 108 (90 Base) MCG/ACT inhaler INHALE 1 TO 2 PUFFS BY MOUTH EVERY 4 HOURS AS NEEDED FOR WHEEZING OR SHORTNESS OF BREATH (Patient taking differently: Inhale 1-2 puffs into the lungs every 4 (four) hours as needed for wheezing or shortness of breath.) 18 g 2   No facility-administered medications prior to visit.     Review of  Systems:   Constitutional: No weight loss or gain, night sweats, fevers, chills, fatigue, or lassitude. HEENT: No headaches, difficulty swallowing, tooth/dental problems, or sore throat. No sneezing, itching, ear ache. + nasal congestion, post nasal drip, voice hoarseness  CV:  No chest pain, orthopnea, PND, swelling in lower extremities, anasarca, dizziness, palpitations, syncope Resp: +shortness of breath with exertion; chronic cough. No excess mucus or change in color of mucus. No productive or non-productive. No hemoptysis. No wheezing.  No chest wall deformity GI:  No heartburn, indigestion, abdominal pain, nausea, vomiting, diarrhea, change in bowel habits, loss  of appetite, bloody stools.  GU: No dysuria, change in color of urine, urgency or frequency.  Skin: No rash, lesions, ulcerations MSK:  No joint pain or swelling.   Neuro: No dizziness or lightheadedness.  Psych: No depression or anxiety. Mood stable.     Physical Exam:  BP (!) 158/96   Pulse 93   Temp 98.4 F (36.9 C) (Oral)   Ht 5\' 4"  (1.626 m)   Wt 190 lb 3.2 oz (86.3 kg)   SpO2 97%   BMI 32.65 kg/m   GEN: Pleasant, interactive, well-appearing; obese; in no acute distress. HEENT:  Normocephalic and atraumatic. PERRLA. Sclera white. Nasal turbinates pink, moist and patent bilaterally. No rhinorrhea present. Oropharynx pink and moist, without exudate or edema. No lesions, ulcerations, or postnasal drip. Hoarse voice quality  NECK:  Supple w/ fair ROM. No JVD present. Normal carotid impulses w/o bruits. Thyroid symmetrical with no goiter or nodules palpated. No lymphadenopathy.   CV: RRR, no m/r/g, no peripheral edema. Pulses intact, +2 bilaterally. No cyanosis, pallor or clubbing. PULMONARY:  Unlabored, regular breathing. Clear bilaterally A&P w/o wheezes/rales/rhonchi. No accessory muscle use.  GI: BS present and normoactive. Soft, non-tender to palpation. No organomegaly or masses detected.  MSK: No erythema, warmth  or tenderness. Cap refil <2 sec all extrem. No deformities or joint swelling noted.  Neuro: A/Ox3. No focal deficits noted.   Skin: Warm, no lesions or rashe Psych: Normal affect and behavior. Judgement and thought content appropriate.     Lab Results:  CBC    Component Value Date/Time   WBC 12.1 (H) 05/18/2022 1503   RBC 4.58 05/18/2022 1503   HGB 14.6 05/18/2022 1503   HGB 14.3 04/02/2022 0841   HCT 41.8 05/18/2022 1503   HCT 42.3 04/02/2022 0841   PLT 369 05/18/2022 1503   PLT 378 04/02/2022 0841   MCV 91.3 05/18/2022 1503   MCV 92 04/02/2022 0841   MCH 31.9 05/18/2022 1503   MCHC 34.9 05/18/2022 1503   RDW 13.4 05/18/2022 1503   RDW 12.7 04/02/2022 0841   LYMPHSABS 2.8 05/18/2022 1503   LYMPHSABS 1.9 04/02/2022 0841   MONOABS 0.6 05/18/2022 1503   EOSABS 0.3 05/18/2022 1503   EOSABS 0.2 04/02/2022 0841   BASOSABS 0.1 05/18/2022 1503   BASOSABS 0.0 04/02/2022 0841    BMET    Component Value Date/Time   NA 137 05/18/2022 1503   NA 141 04/02/2022 0841   K 3.9 05/18/2022 1503   CL 105 05/18/2022 1503   CO2 22 05/18/2022 1503   GLUCOSE 120 (H) 05/18/2022 1503   BUN 12 05/18/2022 1503   BUN 12 04/02/2022 0841   CREATININE 0.72 05/18/2022 1503   CALCIUM 9.6 05/18/2022 1503   GFRNONAA >60 05/18/2022 1503   GFRAA  06/10/2010 2207    >60        The eGFR has been calculated using the MDRD equation. This calculation has not been validated in all clinical situations. eGFR's persistently <60 mL/min signify possible Chronic Kidney Disease.    BNP No results found for: "BNP"   Imaging:  MM 3D SCREENING MAMMOGRAM BILATERAL BREAST  Result Date: 06/03/2022 CLINICAL DATA:  Screening. EXAM: DIGITAL SCREENING BILATERAL MAMMOGRAM WITH TOMOSYNTHESIS AND CAD TECHNIQUE: Bilateral screening digital craniocaudal and mediolateral oblique mammograms were obtained. Bilateral screening digital breast tomosynthesis was performed. The images were evaluated with computer-aided  detection. COMPARISON:  Previous exam(s). ACR Breast Density Category b: There are scattered areas of fibroglandular density. FINDINGS: There are no findings  suspicious for malignancy. IMPRESSION: No mammographic evidence of malignancy. A result letter of this screening mammogram will be mailed directly to the patient. RECOMMENDATION: Screening mammogram in one year. (Code:SM-B-01Y) BI-RADS CATEGORY  1: Negative. Electronically Signed   By: Amie Portland M.D.   On: 06/03/2022 08:58   DG Cervical Spine 2 or 3 views  Result Date: 06/01/2022 CLINICAL DATA:  Left arm numbness and tingling. EXAM: CERVICAL SPINE - 2-3 VIEW COMPARISON:  None Available. FINDINGS: No fracture, bone lesion or spondylolisthesis. Mild loss of disc height at C5-C6 with small anterior endplate osteophytes. Remaining disc spaces are well preserved. Mild bilateral carotid artery vascular calcifications. Soft tissues otherwise unremarkable. IMPRESSION: 1. No fracture or acute finding.  No malalignment. 2. Mild disc degenerative changes at C5-C6. Electronically Signed   By: Amie Portland M.D.   On: 06/01/2022 11:03    ketorolac (TORADOL) 30 MG/ML injection 30 mg     Date Action Dose Route User   06/03/2022 1054 Given 30 mg Intramuscular (Left Ventrogluteal) Southern, Vivien Rossetti, LPN          Latest Ref Rng & Units 06/25/2022   10:48 AM  PFT Results  FVC-Pre L 2.30   FVC-Predicted Pre % 68   FVC-Post L 2.30   FVC-Predicted Post % 68   Pre FEV1/FVC % % 81   Post FEV1/FCV % % 79   FEV1-Pre L 1.87   FEV1-Predicted Pre % 71   FEV1-Post L 1.82   DLCO uncorrected ml/min/mmHg 15.49   DLCO UNC% % 75   DLCO corrected ml/min/mmHg 14.96   DLCO COR %Predicted % 73   DLVA Predicted % 82   TLC L 5.06   TLC % Predicted % 100   RV % Predicted % 101     No results found for: "NITRICOXIDE"      Assessment & Plan:   Moderate persistent asthma Concern for asthma given childhood onset and allergic rhinitis. Her pulmonary function  testing revealed a reduction in lung function but no formal obstruction.  She did use her Trelegy a few hours prior to testing, which likely skewed results.  Possible that she still has an underlying COPD/emphysematous component given her decreased diffusing capacity.  She will remain on triple therapy regimen.  Would like to continue her DPI until she runs out of the inhaler.  Recommend that we try her off of this with HFA style inhaler and spacer to see if it improves her cough/hoarseness.  Provided with 2 samples of Breztri for her to try when she completes her Trelegy.  If she has improvement with this, can consider continuation or changing her to ICS/LABA HFA.  No evidence of fixed upper airway obstruction on flow volume loops.  I am also going to add on Singulair for trigger prevention.  Action plan in place.  Encouraged her to increase activity with graded exercises.  Patient Instructions  Continue Albuterol inhaler 2 puffs every 6 hours as needed for shortness of breath or wheezing. Notify if symptoms persist despite rescue inhaler/neb use. Continue Trelegy 1 puff daily until you run out then switch to Breztri 2 puffs Twice daily with a spacer. Brush tongue and rinse mouth afterwards. If this works better for you or reduces your coughing, then we may consider changing your prescription over  Continue flonase nasal spray 2 sprays each nostril daily Continue omeprazole 1 capsule daily  Guaifenesin (mucinex) 600 mg 1-2 times a day for cough/congestion Singulair (montelukast) 1 tab At bedtime for allergies/asthma  CT chest ordered - someone will contact you to schedule this  Follow up in 8 weeks with Dr. Sherene Sires or Florentina Addison Pammy Vesey,NP to see how inhaler change/new medications are working and review CT. If symptoms do not improve or worsen, please contact office for sooner follow up or seek emergency care.    Chronic cough Likely multifactorial related to asthma, chronic bronchitis/emphysema?, UACS, and  smoking. Given her DOE, cough and smoking history, we will plan for CT chest to rule out underlying lung disease or suspicious nodules/masses. She is not a candidate for lung cancer screening given pack year hx <20. See above plan. Advised on measures to limit upper airway irritation. Smoking cessation advised.    DOE (dyspnea on exertion) See above.  Light tobacco smoker Reviewed risks of continued smoking. Strongly encouraged on smoking cessation. See above regarding lung cancer screening program.  The patient's current tobacco use: 6 cigarettes a day  The patient was advised to quit and impact of smoking on their health.  I assessed the patient's willingness to attempt to quit. I provided methods and skills for cessation. We reviewed medication management of smoking session drugs if appropriate. Resources to help quit smoking were provided. A smoking cessation quit date was set: 07/25/2022 Follow-up was arranged in our clinic.  The amount of time spent counseling patient was 4 mins    I spent 35 minutes of dedicated to the care of this patient on the date of this encounter to include pre-visit review of records, face-to-face time with the patient discussing conditions above, post visit ordering of testing, clinical documentation with the electronic health record, making appropriate referrals as documented, and communicating necessary findings to members of the patients care team.  Noemi Chapel, NP 06/26/2022  Pt aware and understands NP's role.

## 2022-06-25 NOTE — Progress Notes (Signed)
Full PFT performed today. °

## 2022-06-25 NOTE — Patient Instructions (Signed)
Full PFT performed today. °

## 2022-06-26 ENCOUNTER — Encounter: Payer: Self-pay | Admitting: Nurse Practitioner

## 2022-06-26 DIAGNOSIS — R0609 Other forms of dyspnea: Secondary | ICD-10-CM | POA: Insufficient documentation

## 2022-06-26 DIAGNOSIS — R053 Chronic cough: Secondary | ICD-10-CM | POA: Insufficient documentation

## 2022-06-26 DIAGNOSIS — F17200 Nicotine dependence, unspecified, uncomplicated: Secondary | ICD-10-CM | POA: Insufficient documentation

## 2022-06-26 MED ORDER — LORATADINE 10 MG PO TABS
10.0000 mg | ORAL_TABLET | Freq: Every day | ORAL | 0 refills | Status: DC
Start: 2022-06-26 — End: 2022-10-26

## 2022-06-26 MED ORDER — OMEPRAZOLE 20 MG PO CPDR
20.0000 mg | DELAYED_RELEASE_CAPSULE | Freq: Every day | ORAL | 0 refills | Status: DC
Start: 2022-06-26 — End: 2022-11-12

## 2022-06-26 MED ORDER — GABAPENTIN 100 MG PO CAPS
300.0000 mg | ORAL_CAPSULE | Freq: Three times a day (TID) | ORAL | 0 refills | Status: DC
Start: 2022-06-26 — End: 2022-09-02

## 2022-06-26 NOTE — Assessment & Plan Note (Signed)
See above

## 2022-06-26 NOTE — Assessment & Plan Note (Addendum)
Likely multifactorial related to asthma, chronic bronchitis/emphysema?, UACS, and smoking. Given her DOE, cough and smoking history, we will plan for CT chest to rule out underlying lung disease or suspicious nodules/masses. She is not a candidate for lung cancer screening given pack year hx <20. See above plan. Advised on measures to limit upper airway irritation. Smoking cessation advised.

## 2022-06-26 NOTE — Assessment & Plan Note (Signed)
Concern for asthma given childhood onset and allergic rhinitis. Her pulmonary function testing revealed a reduction in lung function but no formal obstruction.  She did use her Trelegy a few hours prior to testing, which likely skewed results.  Possible that she still has an underlying COPD/emphysematous component given her decreased diffusing capacity.  She will remain on triple therapy regimen.  Would like to continue her DPI until she runs out of the inhaler.  Recommend that we try her off of this with HFA style inhaler and spacer to see if it improves her cough/hoarseness.  Provided with 2 samples of Breztri for her to try when she completes her Trelegy.  If she has improvement with this, can consider continuation or changing her to ICS/LABA HFA.  No evidence of fixed upper airway obstruction on flow volume loops.  I am also going to add on Singulair for trigger prevention.  Action plan in place.  Encouraged her to increase activity with graded exercises.  Patient Instructions  Continue Albuterol inhaler 2 puffs every 6 hours as needed for shortness of breath or wheezing. Notify if symptoms persist despite rescue inhaler/neb use. Continue Trelegy 1 puff daily until you run out then switch to Breztri 2 puffs Twice daily with a spacer. Brush tongue and rinse mouth afterwards. If this works better for you or reduces your coughing, then we may consider changing your prescription over  Continue flonase nasal spray 2 sprays each nostril daily Continue omeprazole 1 capsule daily  Guaifenesin (mucinex) 600 mg 1-2 times a day for cough/congestion Singulair (montelukast) 1 tab At bedtime for allergies/asthma  CT chest ordered - someone will contact you to schedule this  Follow up in 8 weeks with Dr. Sherene Sires or Florentina Addison Rosalina Dingwall,NP to see how inhaler change/new medications are working and review CT. If symptoms do not improve or worsen, please contact office for sooner follow up or seek emergency care.

## 2022-06-26 NOTE — Assessment & Plan Note (Signed)
Reviewed risks of continued smoking. Strongly encouraged on smoking cessation. See above regarding lung cancer screening program.  The patient's current tobacco use: 6 cigarettes a day  The patient was advised to quit and impact of smoking on their health.  I assessed the patient's willingness to attempt to quit. I provided methods and skills for cessation. We reviewed medication management of smoking session drugs if appropriate. Resources to help quit smoking were provided. A smoking cessation quit date was set: 07/25/2022 Follow-up was arranged in our clinic.  The amount of time spent counseling patient was 4 mins

## 2022-06-29 MED ORDER — NAPROXEN 500 MG PO TABS: 500.0000 mg | ORAL_TABLET | Freq: Two times a day (BID) | ORAL | 0 refills | Status: DC

## 2022-06-30 ENCOUNTER — Other Ambulatory Visit: Payer: Self-pay | Admitting: Family Medicine

## 2022-06-30 DIAGNOSIS — I700 Atherosclerosis of aorta: Secondary | ICD-10-CM

## 2022-07-03 ENCOUNTER — Encounter: Payer: Self-pay | Admitting: Family Medicine

## 2022-07-03 ENCOUNTER — Ambulatory Visit (INDEPENDENT_AMBULATORY_CARE_PROVIDER_SITE_OTHER): Payer: BC Managed Care – PPO | Admitting: Family Medicine

## 2022-07-03 VITALS — BP 114/79 | HR 110 | Temp 97.3°F | Ht 64.0 in | Wt 189.9 lb

## 2022-07-03 DIAGNOSIS — F419 Anxiety disorder, unspecified: Secondary | ICD-10-CM

## 2022-07-03 DIAGNOSIS — M5442 Lumbago with sciatica, left side: Secondary | ICD-10-CM

## 2022-07-03 DIAGNOSIS — F320 Major depressive disorder, single episode, mild: Secondary | ICD-10-CM | POA: Diagnosis not present

## 2022-07-03 DIAGNOSIS — I10 Essential (primary) hypertension: Secondary | ICD-10-CM | POA: Diagnosis not present

## 2022-07-03 MED ORDER — AMLODIPINE BESYLATE 10 MG PO TABS
10.0000 mg | ORAL_TABLET | Freq: Every day | ORAL | 3 refills | Status: DC
Start: 2022-07-03 — End: 2023-06-21

## 2022-07-03 MED ORDER — DULOXETINE HCL 60 MG PO CPEP
60.0000 mg | ORAL_CAPSULE | Freq: Every day | ORAL | 3 refills | Status: DC
Start: 2022-07-03 — End: 2023-01-15

## 2022-07-03 MED ORDER — KETOROLAC TROMETHAMINE 60 MG/2ML IM SOLN
60.0000 mg | Freq: Once | INTRAMUSCULAR | Status: AC
Start: 2022-07-03 — End: 2022-07-03
  Administered 2022-07-03: 60 mg via INTRAMUSCULAR

## 2022-07-03 NOTE — Progress Notes (Signed)
Established Patient Office Visit  Subjective   Patient ID: Haley Nielsen, female    DOB: [birth year]  Age: 57 y.o. MRN: [id number]  Chief Complaint  Patient presents with   blood pressure follow up    HPI  HTN Complaint with meds - Yes Current Medications - amlodipine 5 mg, olmesartan 20, HCTZ 12.5 Checking BP at home ranging 150s/90s Pertinent ROS:  Headache - Yes Fatigue - No Visual Disturbances - blurred off and on  Chest pain - occasionally for a minute or so. Has appt with cardiology next week Dyspnea - stable Palpitations - No LE edema - No  2. Pain She has an appt with ortho for back pain. She cannot recall when this is. She reports continued left sided sciatica with intermittent numbness/tingling. Stable, not worsening. Denies fever, chills, changes in bowel or bladder control, saddle anesthesia. Reprots improved mobility.   3. Anxiety/depression Taking cymbalta 30 mg for last 4-6 weeks. She has denies a little improvement. Denies SI.      07/03/2022    4:31 PM 06/24/2022    3:03 PM 06/03/2022   10:21 AM  Depression screen PHQ 2/9  Decreased Interest 1 1 2   Down, Depressed, Hopeless 2 1 2   PHQ - 2 Score 3 2 4   Altered sleeping 1 3 3   Tired, decreased energy 1 3 3   Change in appetite 0 1 2  Feeling bad or failure about yourself  1 1 2   Trouble concentrating 1 1 2   Moving slowly or fidgety/restless 1 1 2   Suicidal thoughts 0 1 0  PHQ-9 Score 8 13 18   Difficult doing work/chores Somewhat difficult Not difficult at all Somewhat difficult      07/03/2022    4:31 PM 06/24/2022    3:04 PM 06/03/2022   10:21 AM 05/27/2022    2:30 PM  GAD 7 : Generalized Anxiety Score  Nervous, Anxious, on Edge 0 1 1 0  Control/stop worrying 0 1 2 0  Worry too much - different things 2 3 2 1   Trouble relaxing 2 2 2 1   Restless 0 1 1 1   Easily annoyed or irritable 0 1 1 0  Afraid - awful might happen 0 1 0 0  Total GAD 7 Score 4 10 9 3   Anxiety Difficulty Not difficult at all Not  difficult at all Somewhat difficult Not difficult at all      Past Medical History:  Diagnosis Date   Allergic rhinitis    Allergy    Blurry vision    Bronchospasm    Chest pain    Dizziness    Elevated CK-MB level    GERD (gastroesophageal reflux disease)    Hyperlipidemia    Hypertension    Neuropathy 05/15/2021   Syncope    Syncope    Tobacco abuse    Weakness       ROS As per HPI.    Objective:     BP (!) 151/79   Pulse (!) 110   Temp (!) 97.3 F (36.3 C) (Temporal)   Ht 5\' 4"  (1.626 m)   Wt 189 lb 14.4 oz (86.1 kg)   SpO2 95%   BMI 32.60 kg/m  BP Readings from Last 3 Encounters:  07/03/22 (!) 151/79  06/25/22 (!) 158/96  06/24/22 (!) 192/103      Physical Exam Vitals and nursing note reviewed.  Constitutional:      General: She is not in acute distress.    Appearance: She is not  ill-appearing, toxic-appearing or diaphoretic.  HENT:     Mouth/Throat:     Mouth: Mucous membranes are moist.     Pharynx: Oropharynx is clear.  Eyes:     General:        Right eye: No discharge.        Left eye: No discharge.     Extraocular Movements: Extraocular movements intact.     Pupils: Pupils are equal, round, and reactive to light.  Neck:     Vascular: No carotid bruit.  Cardiovascular:     Rate and Rhythm: Regular rhythm.     Heart sounds: Normal heart sounds. No murmur heard. Pulmonary:     Effort: Pulmonary effort is normal. No respiratory distress.     Breath sounds: Normal breath sounds. No wheezing or rhonchi.  Abdominal:     General: Bowel sounds are normal. There is no distension.     Palpations: Abdomen is soft.     Tenderness: There is no abdominal tenderness.  Musculoskeletal:        General: No swelling or tenderness.     Cervical back: Normal range of motion and neck supple. No rigidity or tenderness.     Right lower leg: No edema.     Left lower leg: No edema.  Skin:    General: Skin is warm and dry.  Neurological:     Mental  Status: She is alert and oriented to person, place, and time.     Cranial Nerves: No facial asymmetry.     Sensory: Sensation is intact.     Motor: Weakness (4/5 LUE and LLE) present.     Gait: Gait abnormal (antalgic gait).  Psychiatric:        Mood and Affect: Mood normal.        Behavior: Behavior normal.      No results found for any visits on 07/03/22.    The 10-year ASCVD risk score (Arnett DK, et al., 2019) is: 15%    Assessment & Plan:   Davasia was seen today for blood pressure follow up.  Diagnoses and all orders for this visit:  Primary hypertension Not at goal. Increase amlodipine to 10 mg daily. Continue to monitor BP at home. Keep cards appt. Aware of when to seek emergency care.  -     amLODipine (NORVASC) 10 MG tablet; Take 1 tablet (10 mg total) by mouth daily.  Acute left-sided low back pain with left-sided sciatica Toradol injection IM today in office. Hold naproxen. Continue gabapentin. Keep appt with ortho.        -     ketorolac (TORADOL) injection 60 mg  Anxiety Depression, major, single episode, mild (HCC) Not well controlled, but improved. Denies SI. Increase cymbalta.  -     DULoxetine (CYMBALTA) 60 MG capsule; Take 1 capsule (60 mg total) by mouth daily.   Return in about 2 weeks (around 07/17/2022) for BP check.   The patient indicates understanding of these issues and agrees with the plan.  Gabriel Earing, FNP

## 2022-07-03 NOTE — Patient Instructions (Addendum)
[facility] [place] [address] - [place] [address] [phone]  Increase amlodipine to 10 mg daily for blood pressure.   Increase duloxetine to 60 mg daily for anxiety, depression, pain

## 2022-07-05 NOTE — Progress Notes (Unsigned)
Cardiology Office Note:   Date:  07/08/2022  ID:  Haley Nielsen, DOB 08-04-1965, MRN [id number] PCP: Gabriel Earing, FNP  [facility] Providers Cardiologist:  None    History of Present Illness:   Haley Nielsen is a 57 y.o. female with a hx of chest pain.  She was seen by Dr. Katrinka Blazing in the past for this in 2019 .  This is my first visit with her.   Echo in 2024 demonstrated an EF of 60 - 65%.   She was recently in the hospital for left-sided weakness.  She states she could not move her arm or leg.  However, MRI and CT were unremarkable.  She has neurology follow-up.  This is why the echocardiogram was ordered.  She was treated with aspirin.  She did have back pain and she has not had some cervical disc issues on x-ray.  She also has some lumbar disc problems on MRI.  There were no other acute findings.  She had some complaints of chest discomfort in the past as mentioned but these were felt to be nonanginal.  She describes some fleeting sharp discomfort.  She occasionally has some palpitations.  She reports a couple of episodes of syncope unwitnessed with the last about a month ago.  She says she just wakes up on the floor.  She has not injured anything.  She is currently out of work because of persistent left-sided weakness.  She states she has been using a walker sometimes.  I see that she has a CT of her chest ordered apparently for evaluation of chest discomfort as well as a smoking history.  She has not had any prior stress testing.  Again she has not been very active because of this recent events and so she cannot give me any symptoms with activity.  She seems to have lots of somatic complaints.  Hospital records reviewed for this visit  ROS: As stated in the HPI and negative for all other systems.  Studies Reviewed:    EKG:  NA  06/24/2022 sinus rhythm, rate 90, axis within normal limits, intervals within normal limits, no acute ST-T wave changes.  Risk Assessment/Calculations:        Physical Exam:   VS:  BP (!) 140/90   Pulse 92   Ht 5\' 4"  (1.626 m)   Wt 191 lb (86.6 kg)   BMI 32.79 kg/m    Wt Readings from Last 3 Encounters:  07/08/22 191 lb (86.6 kg)  07/03/22 189 lb 14.4 oz (86.1 kg)  06/25/22 190 lb 3.2 oz (86.3 kg)     GEN: Well nourished, well developed in no acute distress NECK: No JVD; No carotid bruits CARDIAC: RRR, 2 out of 6 apical systolic murmur and heard slightly at the axilla and nonradiating, no diastolic murmurs, rubs, gallops RESPIRATORY:  Clear to auscultation without rales, wheezing or rhonchi  ABDOMEN: Soft, non-tender, non-distended EXTREMITIES:  No edema; No deformity   ASSESSMENT AND PLAN:   Precordial chest pain:    Her chest discomfort is vague and not anginal sounding.  I would not suggest a cardiac etiology.  She is having workup for back problems and I will defer further workup to her primary provider.  Dyslipidemia: LDL was 78 with an HDL of 46.  Goals of therapy can be based on whether she has any calcium when she gets her upcoming CT.  HTN: Blood pressure slightly elevated but she just had her meds adjusted by her primary provider so  I will not make any changes.  Syncope: She has a vague history of syncope and palpitations.  I will apply a 4-week monitor.  If this is unremarkable then no further workup.    Follow up me as needed based on the results of the above.  Possible  Signed, Rollene Rotunda, MD

## 2022-07-07 ENCOUNTER — Encounter (HOSPITAL_COMMUNITY): Payer: BC Managed Care – PPO | Admitting: Occupational Therapy

## 2022-07-08 ENCOUNTER — Ambulatory Visit: Payer: BC Managed Care – PPO | Admitting: Cardiology

## 2022-07-08 ENCOUNTER — Encounter: Payer: Self-pay | Admitting: Cardiology

## 2022-07-08 VITALS — BP 140/90 | HR 92 | Ht 64.0 in | Wt 191.0 lb

## 2022-07-08 DIAGNOSIS — R55 Syncope and collapse: Secondary | ICD-10-CM | POA: Diagnosis not present

## 2022-07-08 NOTE — Patient Instructions (Signed)
Medication Instructions:  The current medical regimen is effective;  continue present plan and medications.  *If you need a refill on your cardiac medications before your next appointment, please call your pharmacy*  Testing/Procedures: Your physician has recommended that you wear an event monitor for 30 days. Event monitors are medical devices that record the heart's electrical activity. Doctors most often [place] these monitors to diagnose arrhythmias. Arrhythmias are problems with the speed or rhythm of the heartbeat. The monitor is a small, portable device. You can wear one while you do your normal daily activities. This is usually used to diagnose what is causing palpitations/syncope (passing out). This will come to your home address with instructions of how to use it.  Follow-Up: At [facility], you and your health needs are our priority.  As part of our continuing mission to provide you with exceptional heart care, we have created designated Provider Care Teams.  These Care Teams include your primary Cardiologist (physician) and Advanced Practice Providers (APPs -  Physician Assistants and Nurse Practitioners) who all work together to provide you with the care you need, when you need it.  We recommend signing up for the patient portal called "MyChart".  Sign up information is provided on this After Visit Summary.  MyChart is used to connect with patients for Virtual Visits (Telemedicine).  Patients are able to view lab/test results, encounter notes, upcoming appointments, etc.  Non-urgent messages can be sent to your provider as well.   To learn more about what you can do with MyChart, go to [email or web address].    Your next appointment:   Follow up will be based on the results of your heart monitor.

## 2022-07-09 ENCOUNTER — Other Ambulatory Visit: Payer: Self-pay | Admitting: Family Medicine

## 2022-07-09 DIAGNOSIS — M5442 Lumbago with sciatica, left side: Secondary | ICD-10-CM

## 2022-07-13 ENCOUNTER — Ambulatory Visit: Payer: BC Managed Care – PPO | Admitting: Nurse Practitioner

## 2022-07-13 NOTE — Progress Notes (Unsigned)
Primary GI: Lynann Bologna, MD  Assessment and Plan   Brief Narrative:  57 y.o.  female whose past medical history includes,  but is not necessarily limited to, tobacco abuse, asthma, HTN, anxiety, adenomatous colon polyps, uterine fibroids  Several month history of LLQ pain. Pain may be 2/2 constipation but there seems to be a musculoskeletal component as well. Possibly referred pain A Lumbar MRI in April showed disc protrusion at L4-L5, closely approximating and potentially affecting the exiting left L4  nerve root.   CTAP in April for abdominal pain was non-diagnostic. Today's abdominal exam is not overly concerning.  -Need to treat constipation and see if pain resolves -Follow up in 2 months. If in the interim LLQ pain despite improvement in constipation then will send Rx for Bentyl though it can exacerbate constipation -Patient is up-to-date on colonoscopy, last one November 2023 with removal of a small tubular adenoma  Constipation with straining /  hard stool.  - Start a daily fiber supplement such as Metamucil or Fibercon - Take 1-2 stool softeners ( Docusate Sodium) at bedtime with a full glass of water - If no improvement with above over the next 1-2 weeks, add a capful of miralax in 8 oz of water daily - Do not strain to have a bowel movement. Can use glycerin suppositories as needed to make it easier to pass stool. - Basic recommendations for constipation ( water intake, diet, sitting position on toilet, etc) on AVS  Chronic GERD Frequent breakthrough pyrosis on daily omeprazole which she takes in the morning before breakfast   -Discussed anti-reflux measures such as avoidance of late meals / bedtime snacks, HOB elevation (or use of wedge pillow), weight reduction ( if applicable)  / maintaining a healthy BMI ( body mass index),  and avoidance of trigger foods and caffeine.  -Add famotidine 20 mg at bedtime.  -Will reassess at follow up appt. If not improving consider EGD.    Minimally elevated liver enzymes in April (previously normal).  No hepatobiliary findings on CT scan.  -If liver enzymes remain elevated on routine blood work with PCP then we are happy to address   History of Present Illness   Chief complaint:  abdominal pain and constipation   Patient had a screening colonoscopy in Nov 2023. A small tubular adenoma was removed, + diverticulosis.   Haley Nielsen went to [facility] ED on 4/25 with abdominal pain and back pain . CT AP for abdominal pain showed no acute findings. Lumbar MRI showed a small disc protrusion at L4-5,  closely approximating and potentially affecting the exiting left L4  nerve root.  Patient then admitted to hospital in late April with concerns for stroke.  Stroke workup negative.  Haley Nielsen is here today with LLQ pain and severe constipation.  Yesterday she had a small hard bowel movement consisting of small hard stool balls.  Prior to that she had not had a bowel movement in a week.  She thinks some of her medications are constipating her.  She gets the urge to have a bowel movement daily and sits on the toilet for long time every day.  She has not taken anything for constipation but she does drink a lot of water.  She really cannot tell if the LLQ pain gets better with a bowel movement since she really has not had a normal bowel movement in quite some time.  The LLQ pain sometimes gets worse with eating but it also sometimes gets worse in certain  positions       Latest Ref Rng & Units 05/18/2022    3:03 PM 04/02/2022    8:41 AM 08/04/2021    4:29 PM  Hepatic Function  Total Protein 6.5 - 8.1 g/dL 7.7  6.8  6.8   Albumin 3.5 - 5.0 g/dL 4.3  4.5  4.3   AST 15 - 41 U/L 65  18  19   ALT 0 - 44 U/L 62  20  22   Alk Phosphatase 38 - 126 U/L 77  99  102   Total Bilirubin 0.3 - 1.2 mg/dL 0.9  0.4  0.2        Latest Ref Rng & Units 05/18/2022    3:03 PM 04/02/2022    8:41 AM 08/04/2021    4:29 PM  CBC  WBC 4.0 - 10.5 K/uL 12.1  8.1  9.7    Hemoglobin 12.0 - 15.0 g/dL 16.1  09.6  04.5   Hematocrit 36.0 - 46.0 % 41.8  42.3  37.2   Platelets 150 - 400 K/uL 369  378  335     PMH:  Tobacco abuse, hypertension, neuropathy, GERD, adenomatous colon polyps hyperlipidemia  Past Surgical History:  Procedure Laterality Date   TUBAL LIGATION      Family History  Problem Relation Age of Onset   Colon polyps Mother    Hyperlipidemia Mother    Hypertension Mother    Stroke Father 14   Heart attack Maternal Grandmother    Cancer Paternal Grandmother        cervical cancer   Colon cancer Neg Hx    Esophageal cancer Neg Hx    Rectal cancer Neg Hx    Stomach cancer Neg Hx    Breast cancer Neg Hx     Current Medications, Allergies, Family History and Social History were reviewed in [employer] medical record.     Current Outpatient Medications  Medication Sig Dispense Refill   amLODipine (NORVASC) 10 MG tablet Take 1 tablet (10 mg total) by mouth daily. 90 tablet 3   aspirin EC (EQ ASPIRIN ADULT LOW DOSE) 81 MG tablet TAKE 1 TABLET BY MOUTH ONCE DAILY. SWALLOW WHOLE 90 tablet 1   atorvastatin (LIPITOR) 80 MG tablet Take 1 tablet (80 mg total) by mouth every evening. Keep on file 90 tablet 3   busPIRone (BUSPAR) 5 MG tablet Take 1 tablet (5 mg total) by mouth 2 (two) times daily. 180 tablet 1   cyclobenzaprine (FLEXERIL) 5 MG tablet Take 1 tablet (5 mg total) by mouth 3 (three) times daily as needed for muscle spasms. DO NOT DRIVE OR OPERATE MACHINERY ON THIS MEDICATION 30 tablet 0   DULoxetine (CYMBALTA) 60 MG capsule Take 1 capsule (60 mg total) by mouth daily. 90 capsule 3   ezetimibe (ZETIA) 10 MG tablet Take 1 tablet (10 mg total) by mouth daily. 90 tablet 3   fluticasone (FLONASE) 50 MCG/ACT nasal spray Place 2 sprays into both nostrils daily. 16 g 6   Fluticasone-Umeclidin-Vilant (TRELEGY ELLIPTA) 100-62.5-25 MCG/ACT AEPB Inhale 1 puff into the lungs daily. 1 each 11   gabapentin (NEURONTIN) 100 MG capsule  Take 3 capsules (300 mg total) by mouth 3 (three) times daily. 810 capsule 0   lidocaine (LIDODERM) 5 % Place 1 patch onto the skin daily. Remove & Discard patch within 12 hours or as directed by MD 30 patch 0   loratadine (EQ ALL DAY ALLERGY RELIEF) 10 MG tablet Take 1 tablet (10 mg  total) by mouth daily. 90 tablet 0   methocarbamol (ROBAXIN) 500 MG tablet Take 1 tablet by mouth 4 times daily 30 tablet 0   montelukast (SINGULAIR) 10 MG tablet Take 1 tablet (10 mg total) by mouth at bedtime. 30 tablet 5   Multiple Vitamin (MULTIVITAMIN WITH MINERALS) TABS tablet Take 1 tablet by mouth daily.     naproxen (NAPROSYN) 500 MG tablet Take 1 tablet (500 mg total) by mouth 2 (two) times daily. 60 tablet 0   olmesartan-hydrochlorothiazide (BENICAR HCT) 20-12.5 MG tablet Take 1 tablet by mouth daily. 90 tablet 3   Omega-3 Fatty Acids (FISH OIL) 1000 MG CAPS Take by mouth.     omeprazole (PRILOSEC) 20 MG capsule Take 1 capsule (20 mg total) by mouth daily. 90 capsule 0   VENTOLIN HFA 108 (90 Base) MCG/ACT inhaler INHALE 1 TO 2 PUFFS BY MOUTH EVERY 4 HOURS AS NEEDED FOR WHEEZING OR SHORTNESS OF BREATH (Patient taking differently: Inhale 1-2 puffs into the lungs every 4 (four) hours as needed for wheezing or shortness of breath.) 18 g 2   No current facility-administered medications for this visit.    Review of Systems: No chest pain. No shortness of breath. No urinary complaints.    Physical Exam  Wt Readings from Last 3 Encounters:  07/08/22 191 lb (86.6 kg)  07/03/22 189 lb 14.4 oz (86.1 kg)  06/25/22 190 lb 3.2 oz (86.3 kg)    BP 126/86   Pulse 92   Ht 5\' 4"  (1.626 m)   Wt 191 lb 8 oz (86.9 kg)   SpO2 96%   BMI 32.87 kg/m  Constitutional:  Pleasant, generally well appearing female in no acute distress. Psychiatric: Normal mood and affect. Behavior is normal. EENT: Pupils normal.  Conjunctivae are normal. No scleral icterus. Neck supple.  Cardiovascular: Normal rate, regular rhythm.   Pulmonary/chest: Effort normal and breath sounds normal. No wheezing, rales or rhonchi. Abdominal: Soft, nondistended, nontender. Bowel sounds active throughout. There are no masses palpable. No hepatomegaly. Neurological: Alert and oriented to person place and time.   I spent 30 minutes total reviewing records, obtaining history, performing exam, counseling patient and documenting visit / findings.   Willette Cluster, NP  07/13/2022, 10:27 PM

## 2022-07-14 ENCOUNTER — Ambulatory Visit: Payer: BC Managed Care – PPO | Admitting: Nurse Practitioner

## 2022-07-14 ENCOUNTER — Encounter: Payer: Self-pay | Admitting: Nurse Practitioner

## 2022-07-14 VITALS — BP 126/86 | HR 92 | Ht 64.0 in | Wt 191.5 lb

## 2022-07-14 DIAGNOSIS — M5416 Radiculopathy, lumbar region: Secondary | ICD-10-CM | POA: Diagnosis not present

## 2022-07-14 DIAGNOSIS — R1032 Left lower quadrant pain: Secondary | ICD-10-CM | POA: Diagnosis not present

## 2022-07-14 DIAGNOSIS — K219 Gastro-esophageal reflux disease without esophagitis: Secondary | ICD-10-CM

## 2022-07-14 DIAGNOSIS — K5900 Constipation, unspecified: Secondary | ICD-10-CM

## 2022-07-14 MED ORDER — FAMOTIDINE 20 MG PO TABS: 20.0000 mg | ORAL_TABLET | Freq: Every day | ORAL | 3 refills | Status: AC

## 2022-07-14 NOTE — Patient Instructions (Addendum)
_______________________________________________________  If your blood pressure at your visit was 140/90 or greater, please contact your primary care physician to follow up on this. _______________________________________________________  If you are age 57 or younger, your body mass index should be between 19-25. Your Body mass index is 32.87 kg/m. If this is out of the aformentioned range listed, please consider follow up with your Primary Care Provider.  ________________________________________________________  The [facility] providers would like to encourage you to use [facility] to communicate with providers for non-urgent requests or questions.  Due to long hold times on the telephone, sending your provider a message by [facility] may be a faster and more efficient way to get a response.  Please allow 48 business hours for a response.  Please remember that this is for non-urgent requests.  _______________________________________________________  Please continue Prilosec in the morning before breakfast.  We have sent the following medications to your pharmacy for you to pick up at your convenience:  START: Pepcid 20mg  one tablet every night at bedtime   Things to help with constipation: - Drink 60 ounces of water daily  - Eat foods high in fiber. Examples include whole wheat products (especially wheat bran), quinoa, brown rice, legumes, leafy greens like kale, almonds, walnuts, seeds, and fruits with edible skins like pears and apples. - Start a daily fiber supplement such as Metamucil. Follow instructions on container - Take 1-2 stool softeners ( Docusate Sodium) at bedtime if stools are hard - If no improvement with above over the next 1-2 weeks, can add a capful of miralax in 8 oz of water daily - DO NOT STRAIN to have a bowel movement. You can use glycerin suppositories as needed to make it easier to pass stool.  - During a bowel movement, sit straight up ( do no lean over your knees). If  able, elevate feet 6-8 inches on a stool when having a bowel movement.    Acid Reflux  Below are some measures you can take to possibly improve acid reflux symptoms . We may have discussed some of these today in the office. Not everything on this list may apply to you   --If you are taking anti-reflux ( GERD) medication be sure to take it 30 minutes before breakfast and if taking twice daily then also second dose should be 30 minutes before dinner.   --Avoid late meals / bedtime snacks.   --Avoid trigger foods ( foods which you know tend to aggravate you reflux symptoms). Some common trigger foods include spicy foods, fatty foods, acidic foods, chocolate and caffeine.  --Elevate the head of bed 6-8 inches on blocks or bricks. If not able to elevate the head of the bed consider purchasing a wedge pillow to sleep on.    --Weight reduction / maintain a healthy BMI ( body mass index) may be help with reflux symptoms  --Sometimes with the above mentioned "lifestyle changes" patients are able to reduce the amount of GERD medications they take. Our goal is to have you on the lowest effective dose of medication  You are scheduled to follow up in our office on 09-24-2022 at 2pm.  Thank you for trusting me with your gastrointestinal care!    Gunnar Fusi, NP

## 2022-07-15 NOTE — Progress Notes (Signed)
Agree with assessment/plan.  Raj Jemiah Cuadra, MD [facility] [phone]  

## 2022-07-17 ENCOUNTER — Encounter: Payer: Self-pay | Admitting: Family Medicine

## 2022-07-17 ENCOUNTER — Ambulatory Visit: Payer: BC Managed Care – PPO | Admitting: Family Medicine

## 2022-07-17 VITALS — BP 131/84 | HR 94 | Temp 98.2°F | Ht 64.0 in | Wt 193.0 lb

## 2022-07-17 DIAGNOSIS — I10 Essential (primary) hypertension: Secondary | ICD-10-CM | POA: Diagnosis not present

## 2022-07-17 DIAGNOSIS — F419 Anxiety disorder, unspecified: Secondary | ICD-10-CM

## 2022-07-17 DIAGNOSIS — M5442 Lumbago with sciatica, left side: Secondary | ICD-10-CM | POA: Diagnosis not present

## 2022-07-17 DIAGNOSIS — F320 Major depressive disorder, single episode, mild: Secondary | ICD-10-CM | POA: Diagnosis not present

## 2022-07-17 NOTE — Progress Notes (Signed)
Established Patient Office Visit  Subjective   Patient ID: Haley Nielsen, female    DOB: 11/08/1965  Age: 57 y.o. MRN: [id number]  Chief Complaint  Patient presents with   Follow-up    F/u bp--- Leg/back pain    HPI  HTN Complaint with meds - Yes Current Medications - amlodipine 5 mg, olmesartan 20, HCTZ 12.5 Checking BP at home ranging 150s/90s Pertinent ROS:  Headache - Yes Fatigue - No Visual Disturbances - blurred off and on  Chest pain - occasionally for a minute or so. Has appt with cardiology next week Dyspnea - stable Palpitations - No LE edema - No  2. Pain She was seen at ortho. She was started on tramadol. They are getting approval for back injections from her insurance and will schedule this when she gets approval. She is still in significant pain daily. Ortho told her to hold on PT for now due to her pain level. She is still having numbness and tingling down her left leg and foot. She is still limping when walking and has to sit down down frequently and is not able to stand for long periods of time and can only walk for short periods. She would be required to walk and stand for long periods at work. Ortho agrees that she should not return for work.   3. Anxiety/depression Symptoms have been better over all but still up and down.       07/17/2022    1:27 PM 07/03/2022    4:31 PM 06/24/2022    3:03 PM  Depression screen PHQ 2/9  Decreased Interest 2 1 1   Down, Depressed, Hopeless 1 2 1   PHQ - 2 Score 3 3 2   Altered sleeping 1 1 3   Tired, decreased energy 2 1 3   Change in appetite 2 0 1  Feeling bad or failure about yourself  1 1 1   Trouble concentrating 1 1 1   Moving slowly or fidgety/restless 1 1 1   Suicidal thoughts 0 0 1  PHQ-9 Score 11 8 13   Difficult doing work/chores Not difficult at all Somewhat difficult Not difficult at all      07/17/2022    1:27 PM 07/03/2022    4:31 PM 06/24/2022    3:04 PM 06/03/2022   10:21 AM  GAD 7 : Generalized Anxiety  Score  Nervous, Anxious, on Edge 1 0 1 1  Control/stop worrying 2 0 1 2  Worry too much - different things 2 2 3 2   Trouble relaxing 3 2 2 2   Restless 2 0 1 1  Easily annoyed or irritable 2 0 1 1  Afraid - awful might happen 0 0 1 0  Total GAD 7 Score 12 4 10 9   Anxiety Difficulty Not difficult at all Not difficult at all Not difficult at all Somewhat difficult      Past Medical History:  Diagnosis Date   Allergic rhinitis    Allergy    Blurry vision    Bronchospasm    Chest pain    Dizziness    Elevated CK-MB level    GERD (gastroesophageal reflux disease)    Hyperlipidemia    Hypertension    Neuropathy 05/15/2021   Syncope    Syncope    Tobacco abuse    Weakness       ROS As per HPI.    Objective:     BP 131/84   Pulse 94   Temp 98.2 F (36.8 C) (Oral)   Ht  5\' 4"  (1.626 m)   Wt 193 lb (87.5 kg)   SpO2 97%   BMI 33.13 kg/m  BP Readings from Last 3 Encounters:  07/17/22 131/84  07/14/22 126/86  07/08/22 (!) 140/90      Physical Exam Vitals and nursing note reviewed.  Constitutional:      General: She is not in acute distress.    Appearance: She is not ill-appearing, toxic-appearing or diaphoretic.  HENT:     Mouth/Throat:     Mouth: Mucous membranes are moist.     Pharynx: Oropharynx is clear.  Eyes:     General:        Right eye: No discharge.        Left eye: No discharge.     Extraocular Movements: Extraocular movements intact.     Pupils: Pupils are equal, round, and reactive to light.  Neck:     Vascular: No carotid bruit.  Cardiovascular:     Rate and Rhythm: Regular rhythm.     Heart sounds: Normal heart sounds. No murmur heard. Pulmonary:     Effort: Pulmonary effort is normal. No respiratory distress.     Breath sounds: Normal breath sounds. No wheezing or rhonchi.  Abdominal:     General: Bowel sounds are normal. There is no distension.     Palpations: Abdomen is soft.     Tenderness: There is no abdominal tenderness.   Musculoskeletal:        General: No swelling or tenderness.     Cervical back: Normal range of motion and neck supple. No rigidity or tenderness.     Right lower leg: No edema.     Left lower leg: No edema.  Skin:    General: Skin is warm and dry.  Neurological:     Mental Status: She is alert and oriented to person, place, and time.     Cranial Nerves: No facial asymmetry.     Sensory: Sensation is intact.     Motor: Weakness (4/5 LUE and LLE) present.     Gait: Gait abnormal (antalgic gait).  Psychiatric:        Mood and Affect: Mood normal.        Behavior: Behavior normal.      No results found for any visits on 07/17/22.    The 10-year ASCVD risk score (Arnett DK, et al., 2019) is: 9.7%    Assessment & Plan:   Haley Nielsen was seen today for follow-up.  Diagnoses and all orders for this visit:  Primary hypertension BP much better today. Discussed compliance with medications and diet. Monitor BP at home and notify for elevated readings.   Acute left-sided low back pain with left-sided sciatica She has been evaluated by ortho. Plans to have injection done pending insurance approval. Ortho wants her to remain out of work until follow up assessment after injection.   Depression, major, single episode, mild (HCC) Anxiety Improving overall. Continue cymbalta and buspar.    Return in about 4 weeks (around 08/14/2022) for follow up.   The patient indicates understanding of these issues and agrees with the plan.  Gabriel Earing, FNP

## 2022-07-20 DIAGNOSIS — R55 Syncope and collapse: Secondary | ICD-10-CM

## 2022-07-25 ENCOUNTER — Other Ambulatory Visit: Payer: Self-pay | Admitting: Family Medicine

## 2022-07-28 ENCOUNTER — Encounter (HOSPITAL_COMMUNITY): Payer: BC Managed Care – PPO | Admitting: Occupational Therapy

## 2022-07-28 ENCOUNTER — Ambulatory Visit (HOSPITAL_COMMUNITY): Payer: BC Managed Care – PPO

## 2022-07-30 ENCOUNTER — Telehealth: Payer: Self-pay | Admitting: Family Medicine

## 2022-07-30 DIAGNOSIS — M5416 Radiculopathy, lumbar region: Secondary | ICD-10-CM | POA: Diagnosis not present

## 2022-07-30 NOTE — Telephone Encounter (Signed)
Original form pulled, updated w/ TBD at 08/14/22 visit and faxed to [employer] at [phone]

## 2022-08-03 ENCOUNTER — Ambulatory Visit (HOSPITAL_BASED_OUTPATIENT_CLINIC_OR_DEPARTMENT_OTHER)
Admission: RE | Admit: 2022-08-03 | Discharge: 2022-08-03 | Disposition: A | Discharge status: Home / Self Care | Payer: BC Managed Care – PPO | Source: Ambulatory Visit | Attending: Family Medicine | Admitting: Family Medicine

## 2022-08-03 DIAGNOSIS — J439 Emphysema, unspecified: Secondary | ICD-10-CM | POA: Diagnosis not present

## 2022-08-03 DIAGNOSIS — R0609 Other forms of dyspnea: Secondary | ICD-10-CM | POA: Insufficient documentation

## 2022-08-03 DIAGNOSIS — R918 Other nonspecific abnormal finding of lung field: Secondary | ICD-10-CM | POA: Diagnosis not present

## 2022-08-03 DIAGNOSIS — J418 Mixed simple and mucopurulent chronic bronchitis: Secondary | ICD-10-CM | POA: Insufficient documentation

## 2022-08-03 DIAGNOSIS — D3501 Benign neoplasm of right adrenal gland: Secondary | ICD-10-CM | POA: Diagnosis not present

## 2022-08-03 LAB — POCT I-STAT CREATININE: Creatinine, Ser: 0.7 mg/dL (ref 0.44–1.00)

## 2022-08-03 MED ORDER — IOHEXOL 300 MG/ML  SOLN
100.0000 mL | Freq: Once | INTRAMUSCULAR | Status: AC | PRN
  Administered 2022-08-03: 75 mL via INTRAVENOUS

## 2022-08-05 ENCOUNTER — Encounter: Payer: Self-pay | Admitting: Family Medicine

## 2022-08-05 ENCOUNTER — Ambulatory Visit (INDEPENDENT_AMBULATORY_CARE_PROVIDER_SITE_OTHER): Payer: BC Managed Care – PPO | Admitting: Family Medicine

## 2022-08-05 VITALS — BP 143/91 | HR 103 | Temp 97.4°F | Resp 20 | Ht 64.0 in | Wt 194.0 lb

## 2022-08-05 DIAGNOSIS — M5126 Other intervertebral disc displacement, lumbar region: Secondary | ICD-10-CM

## 2022-08-05 DIAGNOSIS — I10 Essential (primary) hypertension: Secondary | ICD-10-CM

## 2022-08-05 DIAGNOSIS — R531 Weakness: Secondary | ICD-10-CM | POA: Diagnosis not present

## 2022-08-05 DIAGNOSIS — M5442 Lumbago with sciatica, left side: Secondary | ICD-10-CM

## 2022-08-05 DIAGNOSIS — F419 Anxiety disorder, unspecified: Secondary | ICD-10-CM

## 2022-08-05 DIAGNOSIS — F320 Major depressive disorder, single episode, mild: Secondary | ICD-10-CM

## 2022-08-05 NOTE — Progress Notes (Signed)
Established Patient Office Visit  Subjective   Patient ID: Haley Nielsen, female    DOB: 12-May-1965  Age: 57 y.o. MRN: [id number]  Chief Complaint  Patient presents with   Back Pain    HPI Haley Nielsen is here for a follow up of her lower back pain. She had an injection with Dr. Ethelene Hal at [facility] on 07/30/22. Reports no benefit from it.  She will follw up with Ramos on 08/17/22. Pain remains 10/10. She has tried norco, flexeril, gabapentin, mobic without relief. Reprots improved tingling, numbness but she contineus to have some in her left foot. She continues to have weakness in her left leg. Denies falls since her last visit but she is easily off balance. Pain and weakness is significant enough that she requires assistance from his husband for her ADLs. She is required to do heavy lifting at work and does not have a light duty option.   Reprots BP has been well controlled at home. Denies chest pain, shortness of breath, visual disturbances, dizziness. Sometimes has headaches. She is currently in pain today at visit.       08/05/2022    3:48 PM 07/17/2022    1:27 PM 07/03/2022    4:31 PM  Depression screen PHQ 2/9  Decreased Interest 1 2 1   Down, Depressed, Hopeless 1 1 2   PHQ - 2 Score 2 3 3   Altered sleeping 1 1 1   Tired, decreased energy 2 2 1   Change in appetite 1 2 0  Feeling bad or failure about yourself  1 1 1   Trouble concentrating 1 1 1   Moving slowly or fidgety/restless 1 1 1   Suicidal thoughts 0 0 0  PHQ-9 Score 9 11 8   Difficult doing work/chores Not difficult at all Not difficult at all Somewhat difficult      08/05/2022    3:48 PM 07/17/2022    1:27 PM 07/03/2022    4:31 PM 06/24/2022    3:04 PM  GAD 7 : Generalized Anxiety Score  Nervous, Anxious, on Edge 1 1 0 1  Control/stop worrying 1 2 0 1  Worry too much - different things 2 2 2 3   Trouble relaxing 1 3 2 2   Restless 1 2 0 1  Easily annoyed or irritable 1 2 0 1  Afraid - awful might happen 1 0 0 1  Total GAD 7  Score 8 12 4 10   Anxiety Difficulty Not difficult at all Not difficult at all Not difficult at all Not difficult at all       ROS As per HPI.    Objective:     BP (!) 143/91   Pulse (!) 103   Temp (!) 97.4 F (36.3 C) (Oral)   Resp 20   Ht 5\' 4"  (1.626 m)   Wt 194 lb (88 kg)   SpO2 99%   BMI 33.30 kg/m  BP Readings from Last 3 Encounters:  08/05/22 (!) 143/91  07/17/22 131/84  07/14/22 126/86   Wt Readings from Last 3 Encounters:  08/05/22 194 lb (88 kg)  07/17/22 193 lb (87.5 kg)  07/14/22 191 lb 8 oz (86.9 kg)      Physical Exam Vitals and nursing note reviewed.  Constitutional:      General: She is not in acute distress.    Appearance: She is not ill-appearing, toxic-appearing or diaphoretic.  HENT:     Mouth/Throat:     Mouth: Mucous membranes are moist.     Pharynx: Oropharynx is  clear.  Eyes:     General:        Right eye: No discharge.        Left eye: No discharge.     Extraocular Movements: Extraocular movements intact.     Pupils: Pupils are equal, round, and reactive to light.  Neck:     Vascular: No carotid bruit.  Cardiovascular:     Rate and Rhythm: Regular rhythm.     Heart sounds: Normal heart sounds. No murmur heard. Pulmonary:     Effort: Pulmonary effort is normal. No respiratory distress.     Breath sounds: Normal breath sounds. No wheezing or rhonchi.  Abdominal:     General: Bowel sounds are normal. There is no distension.     Palpations: Abdomen is soft.     Tenderness: There is no abdominal tenderness.  Musculoskeletal:        General: No swelling or tenderness.     Cervical back: Normal range of motion and neck supple. No rigidity or tenderness.     Right lower leg: No edema.     Left lower leg: No edema.  Skin:    General: Skin is warm and dry.  Neurological:     Mental Status: She is alert and oriented to person, place, and time.     Cranial Nerves: No facial asymmetry.     Sensory: Sensation is intact.     Motor:  Weakness (4/5 LUE and LLE) present.     Gait: Gait abnormal (antalgic gait).  Psychiatric:        Mood and Affect: Mood normal.        Behavior: Behavior normal.      No results found for any visits on 08/05/22.    The 10-year ASCVD risk score (Arnett DK, et al., 2019) is: 12.7%    Assessment & Plan:   Haley Nielsen was seen today for back pain.  Diagnoses and all orders for this visit:  Protrusion of lumbar intervertebral disc Acute left-sided low back pain with left-sided sciatica Left-sided weakness -- CVA ruled out To remain out of work as she is unable to completes ADLs without assistance. Instructions given to tirate up gabapentin. Keep follow up appt with [facility] post injection. She will follow up with me in a few weeks.  Primary hypertension Elevated today in office. Discussed likely due to pain as she reports well controlled at home now. Monitor at home and notify for elevated readings.   Depression, major, single episode, mild (HCC) Anxiety Stable. Continue cymbalta.     Return in about 4 weeks (around 09/02/2022) for back follow up.  The patient indicates understanding of these issues and agrees with the plan.    Gabriel Earing, FNP

## 2022-08-05 NOTE — Patient Instructions (Addendum)
Take 4 (400 mg) gabapentin three times a day. If no improvement in pain after 2-3 days, increase to 5 tablets (500 mg) three times a day. After 2-3 days, can increase to 6 tablets (600 mg total) three times a day.

## 2022-08-14 ENCOUNTER — Ambulatory Visit: Payer: BC Managed Care – PPO | Admitting: Family Medicine

## 2022-08-17 DIAGNOSIS — M5416 Radiculopathy, lumbar region: Secondary | ICD-10-CM | POA: Diagnosis not present

## 2022-08-19 ENCOUNTER — Ambulatory Visit: Payer: BC Managed Care – PPO | Attending: Cardiology

## 2022-08-19 DIAGNOSIS — R55 Syncope and collapse: Secondary | ICD-10-CM

## 2022-08-20 ENCOUNTER — Ambulatory Visit: Payer: BC Managed Care – PPO | Admitting: Nurse Practitioner

## 2022-08-20 NOTE — Progress Notes (Signed)
Stable CT chest. Can refer to lung cancer screening program for yearly CT chest as she is an active smoker. Thanks!

## 2022-08-28 ENCOUNTER — Encounter: Payer: Self-pay | Admitting: *Deleted

## 2022-09-01 ENCOUNTER — Ambulatory Visit: Payer: BC Managed Care – PPO | Admitting: Diagnostic Neuroimaging

## 2022-09-01 ENCOUNTER — Encounter: Payer: Self-pay | Admitting: Diagnostic Neuroimaging

## 2022-09-01 VITALS — BP 135/80 | HR 90 | Ht 64.0 in | Wt 199.0 lb

## 2022-09-01 DIAGNOSIS — M25512 Pain in left shoulder: Secondary | ICD-10-CM | POA: Diagnosis not present

## 2022-09-01 DIAGNOSIS — M79605 Pain in left leg: Secondary | ICD-10-CM

## 2022-09-01 DIAGNOSIS — G8929 Other chronic pain: Secondary | ICD-10-CM | POA: Diagnosis not present

## 2022-09-01 DIAGNOSIS — R531 Weakness: Secondary | ICD-10-CM | POA: Diagnosis not present

## 2022-09-01 NOTE — Progress Notes (Signed)
[facility]  PATIENT: Haley Nielsen DOB: May 17, 1965  REFERRING CLINICIAN: Cleora Fleet, MD HISTORY FROM: patient REASON FOR VISIT: new consult   HISTORICAL  CHIEF COMPLAINT:  Chief Complaint  Patient presents with   New Patient (Initial Visit)    Rm 7, here alone  Pt is here for upper and lower extremity weakness and numbness. Pt states she has a tingling/burning sensation in hands and feet that started in April/2024.     HISTORY OF PRESENT ILLNESS:   57 year old female here for evaluation of left arm and left leg numbness and tingling sensation.  Symptoms started in April 2024.  She went to the hospital for evaluation.  Code stroke was activated.  Patient declined thrombolytic therapy.  MRI of the brain was negative for acute stroke.  CTA of head and neck, echocardiogram and stroke workup were completed and unremarkable.  Since that time patient continues to have subjective numbness and weakness in the left face, arm and leg.  She is tolerating her medications.  She is trying to continue to quit smoking.   REVIEW OF SYSTEMS: Full 14 system review of systems performed and negative with exception of: as per HPI.  ALLERGIES: Allergies  Allergen Reactions   Penicillins Hives    HOME MEDICATIONS: Outpatient Medications Prior to Visit  Medication Sig Dispense Refill   amLODipine (NORVASC) 10 MG tablet Take 1 tablet (10 mg total) by mouth daily. 90 tablet 3   aspirin EC (EQ ASPIRIN ADULT LOW DOSE) 81 MG tablet TAKE 1 TABLET BY MOUTH ONCE DAILY. SWALLOW WHOLE 90 tablet 1   atorvastatin (LIPITOR) 80 MG tablet Take 1 tablet (80 mg total) by mouth every evening. Keep on file 90 tablet 3   busPIRone (BUSPAR) 5 MG tablet Take 1 tablet (5 mg total) by mouth 2 (two) times daily. 180 tablet 1   cyclobenzaprine (FLEXERIL) 5 MG tablet Take 1 tablet (5 mg total) by mouth 3 (three) times daily as needed for muscle spasms. DO NOT DRIVE OR OPERATE MACHINERY ON THIS  MEDICATION 30 tablet 0   DULoxetine (CYMBALTA) 60 MG capsule Take 1 capsule (60 mg total) by mouth daily. 90 capsule 3   ezetimibe (ZETIA) 10 MG tablet Take 1 tablet (10 mg total) by mouth daily. 90 tablet 3   famotidine (PEPCID) 20 MG tablet Take 1 tablet (20 mg total) by mouth at bedtime. 90 tablet 3   fluticasone (FLONASE) 50 MCG/ACT nasal spray Place 2 sprays into both nostrils daily. 16 g 6   Fluticasone-Umeclidin-Vilant (TRELEGY ELLIPTA) 100-62.5-25 MCG/ACT AEPB Inhale 1 puff into the lungs daily. 1 each 11   gabapentin (NEURONTIN) 100 MG capsule Take 3 capsules (300 mg total) by mouth 3 (three) times daily. 810 capsule 0   HYDROcodone-acetaminophen (NORCO) 10-325 MG tablet Take 1 tablet by mouth every 8 (eight) hours as needed (for pain).     lidocaine (LIDODERM) 5 % Place 1 patch onto the skin daily. Remove & Discard patch within 12 hours or as directed by MD 30 patch 0   loratadine (EQ ALL DAY ALLERGY RELIEF) 10 MG tablet Take 1 tablet (10 mg total) by mouth daily. 90 tablet 0   methocarbamol (ROBAXIN) 500 MG tablet Take 1 tablet by mouth 4 times daily 30 tablet 0   montelukast (SINGULAIR) 10 MG tablet Take 1 tablet (10 mg total) by mouth at bedtime. 30 tablet 5   Multiple Vitamin (MULTIVITAMIN WITH MINERALS) TABS tablet Take 1 tablet by mouth daily.  naproxen (NAPROSYN) 500 MG tablet Take 1 tablet by mouth twice daily 60 tablet 0   olmesartan-hydrochlorothiazide (BENICAR HCT) 20-12.5 MG tablet Take 1 tablet by mouth daily. 90 tablet 3   Omega-3 Fatty Acids (FISH OIL) 1000 MG CAPS Take by mouth.     omeprazole (PRILOSEC) 20 MG capsule Take 1 capsule (20 mg total) by mouth daily. 90 capsule 0   ULTRAM 50 MG tablet 50 mg.     VENTOLIN HFA 108 (90 Base) MCG/ACT inhaler INHALE 1 TO 2 PUFFS BY MOUTH EVERY 4 HOURS AS NEEDED FOR WHEEZING OR SHORTNESS OF BREATH (Patient taking differently: Inhale 1-2 puffs into the lungs every 4 (four) hours as needed for wheezing or shortness of breath.) 18 g  2   No facility-administered medications prior to visit.    PAST MEDICAL HISTORY: Past Medical History:  Diagnosis Date   Allergic rhinitis    Allergy    Blurry vision    Bronchospasm    Chest pain    Dizziness    Elevated CK-MB level    GERD (gastroesophageal reflux disease)    Hyperlipidemia    Hypertension    Neuropathy 05/15/2021   Syncope    Syncope    Tobacco abuse    Weakness     PAST SURGICAL HISTORY: Past Surgical History:  Procedure Laterality Date   TUBAL LIGATION      FAMILY HISTORY: Family History  Problem Relation Age of Onset   Colon polyps Mother    Hyperlipidemia Mother    Hypertension Mother    Stroke Father 23   Heart attack Maternal Grandmother    Cancer Paternal Grandmother        cervical cancer   Colon cancer Neg Hx    Esophageal cancer Neg Hx    Rectal cancer Neg Hx    Stomach cancer Neg Hx    Breast cancer Neg Hx     SOCIAL HISTORY: Social History   Socioeconomic History   Marital status: Married    Spouse name: Not on file   Number of children: Not on file   Years of education: 11   Highest education level: 11th grade  Occupational History   Not on file  Tobacco Use   Smoking status: Every Day    Current packs/day: 0.50    Average packs/day: 0.5 packs/day for 15.0 years (7.5 ttl pk-yrs)    Types: Cigarettes   Smokeless tobacco: Never  Vaping Use   Vaping status: Never Used  Substance and Sexual Activity   Alcohol use: Not Currently   Drug use: Never   Sexual activity: Yes    Birth control/protection: Surgical  Other Topics Concern   Not on file  Social History Narrative   Not on file   Social Determinants of Health   Financial Resource Strain: Not on file  Food Insecurity: No Food Insecurity (05/18/2022)   Hunger Vital Sign    Worried About Running Out of Food in the Last Year: Never true    Ran Out of Food in the Last Year: Never true  Transportation Needs: No Transportation Needs (05/18/2022)   PRAPARE -  Administrator, Civil Service (Medical): No    Lack of Transportation (Non-Medical): No  Physical Activity: Not on file  Stress: Not on file  Social Connections: Not on file  Intimate Partner Violence: Not At Risk (05/18/2022)   Humiliation, Afraid, Rape, and Kick questionnaire    Fear of Current or Ex-Partner: No    Emotionally  Abused: No    Physically Abused: No    Sexually Abused: No     PHYSICAL EXAM  GENERAL EXAM/CONSTITUTIONAL: Vitals:  Vitals:   09/01/22 0830 09/01/22 0838  BP: (!) 145/90 135/80  Pulse: 87 90  Weight: 199 lb (90.3 kg)   Height: 5\' 4"  (1.626 m)    Body mass index is 34.16 kg/m. Wt Readings from Last 3 Encounters:  09/01/22 199 lb (90.3 kg)  08/05/22 194 lb (88 kg)  07/17/22 193 lb (87.5 kg)   Patient is in no distress; well developed, nourished and groomed; neck is supple  CARDIOVASCULAR: Examination of carotid arteries is normal; no carotid bruits Regular rate and rhythm, no murmurs Examination of peripheral vascular system by observation and palpation is normal  EYES: Ophthalmoscopic exam of optic discs and posterior segments is normal; no papilledema or hemorrhages No results found.  MUSCULOSKELETAL: Gait, strength, tone, movements noted in Neurologic exam below  NEUROLOGIC: MENTAL STATUS:      No data to display         awake, alert, oriented to person, place and time recent and remote memory intact normal attention and concentration language fluent, comprehension intact, naming intact fund of knowledge appropriate  CRANIAL NERVE:  2nd - no papilledema on fundoscopic exam 2nd, 3rd, 4th, 6th - pupils equal and reactive to light, visual fields full to confrontation, extraocular muscles intact, no nystagmus 5th - facial sensation symmetric 7th - facial strength symmetric 8th - hearing intact 9th - palate elevates symmetrically, uvula midline 11th - shoulder shrug symmetric 12th - tongue protrusion midline  MOTOR:   normal bulk and tone, full strength in the BUE, BLE; FLUCTUATING GIVEWAY WEAKNESS IN LEFT TRICEPS AND LEFT HIP FLEXION  SENSORY:  normal and symmetric to light touch, temperature, vibration; DECR IN LEFT FACE, ARM, LEG  COORDINATION:  finger-nose-finger, fine finger movements normal  REFLEXES:  deep tendon reflexes present and symmetric  GAIT/STATION:  narrow based gait; able to walk on toes, heels and tandem; romberg is negative     DIAGNOSTIC DATA (LABS, IMAGING, TESTING) - I reviewed patient records, labs, notes, testing and imaging myself where available.  Lab Results  Component Value Date   WBC 12.1 (H) 05/18/2022   HGB 14.6 05/18/2022   HCT 41.8 05/18/2022   MCV 91.3 05/18/2022   PLT 369 05/18/2022      Component Value Date/Time   NA 137 05/18/2022 1503   NA 141 04/02/2022 0841   K 3.9 05/18/2022 1503   CL 105 05/18/2022 1503   CO2 22 05/18/2022 1503   GLUCOSE 120 (H) 05/18/2022 1503   BUN 12 05/18/2022 1503   BUN 12 04/02/2022 0841   CREATININE 0.70 08/03/2022 0900   CALCIUM 9.6 05/18/2022 1503   PROT 7.7 05/18/2022 1503   PROT 6.8 04/02/2022 0841   ALBUMIN 4.3 05/18/2022 1503   ALBUMIN 4.5 04/02/2022 0841   AST 65 (H) 05/18/2022 1503   ALT 62 (H) 05/18/2022 1503   ALKPHOS 77 05/18/2022 1503   BILITOT 0.9 05/18/2022 1503   BILITOT 0.4 04/02/2022 0841   GFRNONAA >60 05/18/2022 1503   GFRAA  06/10/2010 2207    >60        The eGFR has been calculated using the MDRD equation. This calculation has not been validated in all clinical situations. eGFR's persistently <60 mL/min signify possible Chronic Kidney Disease.   Lab Results  Component Value Date   CHOL 138 05/19/2022   HDL 46 05/19/2022   LDLCALC 78 05/19/2022  TRIG 68 05/19/2022   CHOLHDL 3.0 05/19/2022   Lab Results  Component Value Date   HGBA1C 5.6 05/19/2022   Lab Results  Component Value Date   VITAMINB12 579 05/22/2021   Lab Results  Component Value Date   TSH 1.290  04/02/2022    05/19/22 TTE  1. Left ventricular ejection fraction, by estimation, is 60 to 65%. The  left ventricle has normal function. The left ventricle has no regional  wall motion abnormalities. There is mild left ventricular hypertrophy.  Left ventricular diastolic parameters  are indeterminate.   2. Right ventricular systolic function is normal. The right ventricular  size is normal.   3. The mitral valve is normal in structure. No evidence of mitral valve  regurgitation. No evidence of mitral stenosis.   4. The aortic valve is tricuspid. Aortic valve regurgitation is trivial.  No aortic stenosis is present.   5. The inferior vena cava is normal in size with greater than 50%  respiratory variability, suggesting right atrial pressure of 3 mmHg.   05/14/22 MRI lumbar spine [I reviewed images myself. Essentially normal study. No major spinal stenosis or foraminal stenosis. -VRP]   05/18/22 CTA head / neck  1. No large vessel occlusion, hemodynamically significant stenosis, or aneurysm in the head or neck vessels. 2. Aortic atherosclerosis (ICD10-I70.0).  05/19/22 MRI brain [I reviewed images myself and agree with interpretation. -VRP]  - No acute abnormality.     ASSESSMENT AND PLAN  57 y.o. year old female here with:   Dx:  1. Left-sided weakness -- CVA ruled out   2. Chronic left shoulder pain   3. Left leg pain     PLAN:  LEFT FACE, ARM, LEG NUMBNESS (+ fluctuating left arm, left leg weakness; new onset 05/11/22; unclear etiology; MRI brain negative; stroke workup completed) - continue aspirin 81mg  daily, atorvastatin, zetia, BP control - continue PT/OT when able to tolerate - continue smoking cessation efforts - try to optimize nutrition, exercise and sleep  Return for return to PCP.    Suanne Marker, MD 09/01/2022, 9:32 AM Certified in Neurology, Neurophysiology and Neuroimaging  [facility] [address] [place],  [place] [address] [phone]

## 2022-09-01 NOTE — Patient Instructions (Signed)
  LEFT FACE, ARM, LEG NUMBNESS (+ fluctuating left arm, left leg weakness; new onset 05/11/22; unclear etiology; MRI brain negative; stroke workup completed) - continue aspirin 81mg  daily, atorvastatin, zetia, BP control - continue PT/OT when able to tolerate - continue smoking cessation efforts - try to optimize nutrition, exercise and sleep

## 2022-09-02 ENCOUNTER — Encounter: Payer: Self-pay | Admitting: Family Medicine

## 2022-09-02 ENCOUNTER — Ambulatory Visit: Payer: BC Managed Care – PPO | Admitting: Family Medicine

## 2022-09-02 VITALS — BP 111/74 | HR 90 | Temp 98.3°F | Ht 64.0 in | Wt 199.5 lb

## 2022-09-02 DIAGNOSIS — R531 Weakness: Secondary | ICD-10-CM

## 2022-09-02 DIAGNOSIS — M5126 Other intervertebral disc displacement, lumbar region: Secondary | ICD-10-CM | POA: Diagnosis not present

## 2022-09-02 DIAGNOSIS — M5416 Radiculopathy, lumbar region: Secondary | ICD-10-CM

## 2022-09-02 DIAGNOSIS — G4701 Insomnia due to medical condition: Secondary | ICD-10-CM

## 2022-09-02 MED ORDER — GABAPENTIN 600 MG PO TABS
600.0000 mg | ORAL_TABLET | Freq: Three times a day (TID) | ORAL | 1 refills | Status: AC
Start: 2022-09-02 — End: ?

## 2022-09-02 MED ORDER — TRAZODONE HCL 50 MG PO TABS
25.0000 mg | ORAL_TABLET | Freq: Every evening | ORAL | 3 refills | Status: DC | PRN
Start: 2022-09-02 — End: 2022-12-28

## 2022-09-02 NOTE — Progress Notes (Signed)
Established Patient Office Visit  Subjective   Patient ID: Haley Nielsen, female    DOB: 1965/03/10  Age: 57 y.o. MRN: [id number]  Chief Complaint  Patient presents with   Back Pain    Back Pain   Haley Nielsen is here for a follow up of back pain. She has been out of work since April due to the pain and weakness. Pain is in her lower back and radiates down her left leg to her left foot. She also has tingling and weakness in her left leg. Pain is severe and 10/10 at time. Symtpoms are worsened by activity, standing, bending. Her husband is having to help her with ADLs, cooking, and housework due to her pain. Has difficulty walking due to the pain. Her last follow up with ortho was on 7/29 following L4 nerve block. This did not provide any relief. She has been referred to Dr. Paula Libra for a surgical evaluation. She will see him on 09/08/22.   She has been unable to sleep due to the pain. She has tired tylenol/advil PM without relief. She is only able to sleep a few hours a night. She is taking gabapentin 300 mg TID, robaxin, norco with minimal relief.     Review of Systems  Musculoskeletal:  Positive for back pain.   As per HPI.    Objective:     BP 111/74   Pulse 90   Temp 98.3 F (36.8 C) (Temporal)   Ht 5\' 4"  (1.626 m)   Wt 199 lb 8 oz (90.5 kg)   SpO2 93%   BMI 34.24 kg/m    Physical Exam Vitals and nursing note reviewed.  Constitutional:      General: She is not in acute distress.    Appearance: She is not ill-appearing, toxic-appearing or diaphoretic.  HENT:     Mouth/Throat:     Mouth: Mucous membranes are moist.     Pharynx: Oropharynx is clear.  Eyes:     General:        Right eye: No discharge.        Left eye: No discharge.     Extraocular Movements: Extraocular movements intact.     Pupils: Pupils are equal, round, and reactive to light.  Neck:     Vascular: No carotid bruit.  Cardiovascular:     Rate and Rhythm: Regular rhythm.     Heart sounds: Normal  heart sounds. No murmur heard. Pulmonary:     Effort: Pulmonary effort is normal. No respiratory distress.     Breath sounds: Normal breath sounds. No wheezing or rhonchi.  Abdominal:     General: Bowel sounds are normal. There is no distension.     Palpations: Abdomen is soft.     Tenderness: There is no abdominal tenderness.  Musculoskeletal:        General: No swelling.     Cervical back: Normal range of motion and neck supple. No rigidity or tenderness.     Lumbar back: Tenderness present. No swelling, edema, deformity or bony tenderness. Positive left straight leg raise test.     Right lower leg: No edema.     Left lower leg: No edema.  Skin:    General: Skin is warm and dry.  Neurological:     Mental Status: She is alert and oriented to person, place, and time.     Cranial Nerves: No facial asymmetry.     Sensory: Sensation is intact.     Motor: Weakness (4/5 LUE  and LLE) present.     Gait: Gait abnormal (antalgic gait).  Psychiatric:        Mood and Affect: Mood normal.        Behavior: Behavior normal.      No results found for any visits on 09/02/22.    The 10-year ASCVD risk score (Arnett DK, et al., 2019) is: 5.8%    Assessment & Plan:   Haley Nielsen was seen today for back pain.  Diagnoses and all orders for this visit:  Lumbar radiculopathy Protrusion of lumbar intervertebral disc She remains in significant pain and unable to complete ADLs without assistance. She has an appt on 09/08/22 with Dr. Paula Libra for a surgical evaluation. Unable to complete PT/OT due to pain right now. Increase gabapentin, titration instructions given. Continue norco prn with ortho. Discussed will need to remain out of work with return to work date TBD pending surgical evaluation.  -     gabapentin (NEURONTIN) 600 MG tablet; Take 1 tablet (600 mg total) by mouth 3 (three) times daily.  Insomnia due to medical condition Can try trazodone prn.  -     traZODone (DESYREL) 50 MG tablet;  Take 0.5-1 tablets (25-50 mg total) by mouth at bedtime as needed for sleep.  Left-sided weakness -- CVA ruled out Has had outpatient neurology follow up. No further neuro follow up needed.   Return in about 4 weeks (around 09/30/2022).   The patient indicates understanding of these issues and agrees with the plan.  Gabriel Earing, FNP

## 2022-09-02 NOTE — Patient Instructions (Signed)
Take gabapentin 400 mg 3x a day for 2 days, then increase to 500 mg 3x a day for 3 days, then increase to 600 mg 3x a day.

## 2022-09-08 ENCOUNTER — Other Ambulatory Visit (HOSPITAL_COMMUNITY): Payer: Self-pay | Admitting: Specialist

## 2022-09-08 DIAGNOSIS — M542 Cervicalgia: Secondary | ICD-10-CM

## 2022-09-08 DIAGNOSIS — M5451 Vertebrogenic low back pain: Secondary | ICD-10-CM | POA: Diagnosis not present

## 2022-09-16 ENCOUNTER — Ambulatory Visit (HOSPITAL_COMMUNITY)
Admission: RE | Admit: 2022-09-16 | Discharge: 2022-09-16 | Disposition: A | Discharge status: Home / Self Care | Payer: BC Managed Care – PPO | Source: Ambulatory Visit | Attending: Specialist | Admitting: Specialist

## 2022-09-16 DIAGNOSIS — M50322 Other cervical disc degeneration at C5-C6 level: Secondary | ICD-10-CM | POA: Diagnosis not present

## 2022-09-16 DIAGNOSIS — M50223 Other cervical disc displacement at C6-C7 level: Secondary | ICD-10-CM | POA: Diagnosis not present

## 2022-09-16 DIAGNOSIS — M542 Cervicalgia: Secondary | ICD-10-CM | POA: Diagnosis not present

## 2022-09-16 DIAGNOSIS — M50222 Other cervical disc displacement at C5-C6 level: Secondary | ICD-10-CM | POA: Diagnosis not present

## 2022-09-16 DIAGNOSIS — M47812 Spondylosis without myelopathy or radiculopathy, cervical region: Secondary | ICD-10-CM | POA: Diagnosis not present

## 2022-09-23 DIAGNOSIS — M5416 Radiculopathy, lumbar region: Secondary | ICD-10-CM | POA: Diagnosis not present

## 2022-09-23 DIAGNOSIS — Z79899 Other long term (current) drug therapy: Secondary | ICD-10-CM | POA: Diagnosis not present

## 2022-09-23 DIAGNOSIS — Z5181 Encounter for therapeutic drug level monitoring: Secondary | ICD-10-CM | POA: Diagnosis not present

## 2022-09-24 ENCOUNTER — Ambulatory Visit (INDEPENDENT_AMBULATORY_CARE_PROVIDER_SITE_OTHER): Payer: BC Managed Care – PPO | Admitting: Nurse Practitioner

## 2022-09-24 ENCOUNTER — Encounter: Payer: Self-pay | Admitting: Nurse Practitioner

## 2022-09-24 VITALS — BP 130/82 | HR 94 | Ht 64.0 in | Wt 201.4 lb

## 2022-09-24 DIAGNOSIS — R1310 Dysphagia, unspecified: Secondary | ICD-10-CM | POA: Diagnosis not present

## 2022-09-24 DIAGNOSIS — K219 Gastro-esophageal reflux disease without esophagitis: Secondary | ICD-10-CM | POA: Diagnosis not present

## 2022-09-24 DIAGNOSIS — K5909 Other constipation: Secondary | ICD-10-CM

## 2022-09-24 MED ORDER — LINACLOTIDE 145 MCG PO CAPS: 145.0000 ug | ORAL_CAPSULE | Freq: Every day | ORAL | 3 refills | Status: DC

## 2022-09-24 NOTE — Progress Notes (Signed)
ASSESSMENT & PLAN   57 y.o.  female known to Dr. Chales Abrahams.  She was last seen (by me) 07/14/2022 for evaluation of constipation, GERD, abdominal pain versus musculoskeletal pain.  Here for follow-up  Chronic constipation, likely medication related.  She requires several doses of opioids daily for chronic back pain.  On other constipating medications as well. No improvement after trial of fiber, stool softeners and MiraLAX.  She drinks several bottles of water a day.  Stools are very hard and difficult to evacuate.   -- Trial of Linzess 145 mcg daily on empty stomach .  -- Continue with good hydration  -- Consider pelvic floor PT, will hold off for now.  On DRE she seems to have good perineal descent when bearing down. Right now will focus on softening stool for easier evacuation.   Chronic GERD on PPI + H2 blocker having breakthrough symptoms and intermittent solid food dysphagia. No prior upper endoscopic evaluation.  --Schedule for EGD. The risks and benefits of EGD with possible biopsies were discussed with the patient who agrees to proceed.  --Swallowing precautions discussed. Advised to eat slowly, chew food well before swallowing. Drink  liquids in between each bite to avoid food impaction. --Continue anti-reflux measures such as avoidance of late meals / bedtime snacks, HOB elevation (or use of wedge pillow), weight reduction ( if applicable)  / maintaining a healthy BMI ( body mass index),  and avoidance of trigger foods and caffeine.  -- For now continue PPI in a.m. and famotidine at bedtime  History of adenomatous polyps. She is up-to-date on colonoscopy, last one November 2023 with removal of a small tubular adenoma   History of asthma, hypertension, anxiety, uterine fibroids.   See PMH below for additional history  HPI   Chief complaint : ongoing constipation, persistent reflux, swallowing problems.   LLQ pain / constipation Zylpha did try the Bentyl for LLQ pain and it  helped.  She still feels like the pain is originating from her back.  Unfortunately the constipation has not improved despite 2 week trial of Metamucil followed by a 2-week trial of FiberCon.  Also taking 2 stool softeners a day and daily MiraLAX.  She did not try the glycerin suppositories.  She gets what sounds like normal urges for bowel movements but her stools are so hard she can barely pass them.  She is on a lot of constipating medications especially hydrocodone which she has to take several times a day.   GERD She was having breakthrough pyrosis on daily omeprazole when I saw her in June.  We discussed antireflux measures and added 20 mg of famotidine at bedtime.  The addition of famotidine has not made much of a difference, still having breakthrough pyrosis.  Also complains of feeling like food is hanging up in her esophagus   Previous GI Endoscopies / Labs / Imaging       Latest Ref Rng & Units 05/18/2022    3:03 PM 04/02/2022    8:41 AM 08/04/2021    4:29 PM  CBC  WBC 4.0 - 10.5 K/uL 12.1  8.1  9.7   Hemoglobin 12.0 - 15.0 g/dL 16.1  09.6  04.5   Hematocrit 36.0 - 46.0 % 41.8  42.3  37.2   Platelets 150 - 400 K/uL 369  378  335      Past Medical History:  Diagnosis Date   Allergic rhinitis    Allergy    Blurry vision  Bronchospasm    Chest pain    Dizziness    Elevated CK-MB level    GERD (gastroesophageal reflux disease)    Hyperlipidemia    Hypertension    Neuropathy 05/15/2021   Syncope    Syncope    Tobacco abuse    Weakness     Past Surgical History:  Procedure Laterality Date   TUBAL LIGATION      Family History  Problem Relation Age of Onset   Colon polyps Mother    Hyperlipidemia Mother    Hypertension Mother    Stroke Father 56   Heart attack Maternal Grandmother    Cancer Paternal Grandmother        cervical cancer   Colon cancer Neg Hx    Esophageal cancer Neg Hx    Rectal cancer Neg Hx    Stomach cancer Neg Hx    Breast cancer Neg Hx      Current Medications, Allergies, Family History and Social History were reviewed in [employer] record.     Current Outpatient Medications  Medication Sig Dispense Refill   amLODipine (NORVASC) 10 MG tablet Take 1 tablet (10 mg total) by mouth daily. 90 tablet 3   aspirin EC (EQ ASPIRIN ADULT LOW DOSE) 81 MG tablet TAKE 1 TABLET BY MOUTH ONCE DAILY. SWALLOW WHOLE 90 tablet 1   atorvastatin (LIPITOR) 80 MG tablet Take 1 tablet (80 mg total) by mouth every evening. Keep on file 90 tablet 3   busPIRone (BUSPAR) 5 MG tablet Take 1 tablet (5 mg total) by mouth 2 (two) times daily. 180 tablet 1   DULoxetine (CYMBALTA) 60 MG capsule Take 1 capsule (60 mg total) by mouth daily. 90 capsule 3   ezetimibe (ZETIA) 10 MG tablet Take 1 tablet (10 mg total) by mouth daily. 90 tablet 3   famotidine (PEPCID) 20 MG tablet Take 1 tablet (20 mg total) by mouth at bedtime. 90 tablet 3   fluticasone (FLONASE) 50 MCG/ACT nasal spray Place 2 sprays into both nostrils daily. 16 g 6   Fluticasone-Umeclidin-Vilant (TRELEGY ELLIPTA) 100-62.5-25 MCG/ACT AEPB Inhale 1 puff into the lungs daily. 1 each 11   gabapentin (NEURONTIN) 600 MG tablet Take 1 tablet (600 mg total) by mouth 3 (three) times daily. 270 tablet 1   HYDROcodone-acetaminophen (NORCO) 10-325 MG tablet Take 1 tablet by mouth every 8 (eight) hours as needed (for pain).     lidocaine (LIDODERM) 5 % Place 1 patch onto the skin daily. Remove & Discard patch within 12 hours or as directed by MD 30 patch 0   loratadine (EQ ALL DAY ALLERGY RELIEF) 10 MG tablet Take 1 tablet (10 mg total) by mouth daily. 90 tablet 0   methocarbamol (ROBAXIN) 500 MG tablet Take 1 tablet by mouth 4 times daily 30 tablet 0   montelukast (SINGULAIR) 10 MG tablet Take 1 tablet (10 mg total) by mouth at bedtime. 30 tablet 5   Multiple Vitamin (MULTIVITAMIN WITH MINERALS) TABS tablet Take 1 tablet by mouth daily.     naproxen (NAPROSYN) 500 MG tablet Take 1 tablet  by mouth twice daily 60 tablet 0   olmesartan-hydrochlorothiazide (BENICAR HCT) 20-12.5 MG tablet Take 1 tablet by mouth daily. 90 tablet 3   Omega-3 Fatty Acids (FISH OIL) 1000 MG CAPS Take by mouth.     omeprazole (PRILOSEC) 20 MG capsule Take 1 capsule (20 mg total) by mouth daily. 90 capsule 0   traZODone (DESYREL) 50 MG tablet Take 0.5-1 tablets (  25-50 mg total) by mouth at bedtime as needed for sleep. 30 tablet 3   VENTOLIN HFA 108 (90 Base) MCG/ACT inhaler INHALE 1 TO 2 PUFFS BY MOUTH EVERY 4 HOURS AS NEEDED FOR WHEEZING OR SHORTNESS OF BREATH (Patient taking differently: Inhale 1-2 puffs into the lungs every 4 (four) hours as needed for wheezing or shortness of breath.) 18 g 2   linaclotide (LINZESS) 145 MCG CAPS capsule Take 1 capsule (145 mcg total) by mouth daily. Take this medication on an empty stomach. 30 capsule 3   No current facility-administered medications for this visit.    Review of Systems: No chest pain. No shortness of breath. No urinary complaints.    Physical Exam  Wt Readings from Last 3 Encounters:  09/24/22 201 lb 6.4 oz (91.4 kg)  09/02/22 199 lb 8 oz (90.5 kg)  09/01/22 199 lb (90.3 kg)    BP 130/82   Pulse 94   Ht 5\' 4"  (1.626 m)   Wt 201 lb 6.4 oz (91.4 kg)   SpO2 100%   BMI 34.57 kg/m  Constitutional:  Pleasant, generally well appearing female in no acute distress. Psychiatric: Normal mood and affect. Behavior is normal. EENT: Pupils normal.  Conjunctivae are normal. No scleral icterus. Neck supple.  Cardiovascular: Normal rate, regular rhythm.  Pulmonary/chest: Effort normal and breath sounds normal. No wheezing, rales or rhonchi. Abdominal: Soft, nondistended, nontender. Bowel sounds active throughout. There are no masses palpable. No hepatomegaly. Neurological: Alert and oriented to person place and time.    Willette Cluster, NP  09/24/2022, 2:21 PM

## 2022-09-24 NOTE — Patient Instructions (Addendum)
You have been scheduled for an endoscopy. Please follow written instructions given to you at your visit today.  If you use inhalers (even only as needed), please bring them with you on the day of your procedure.  If you take any of the following medications, they will need to be adjusted prior to your procedure:   DO NOT TAKE 7 DAYS PRIOR TO TEST- Trulicity (dulaglutide) Ozempic, Wegovy (semaglutide) Mounjaro (tirzepatide) Bydureon Bcise (exanatide extended release)  DO NOT TAKE 1 DAY PRIOR TO YOUR TEST Rybelsus (semaglutide) Adlyxin (lixisenatide) Victoza (liraglutide) Byetta (exanatide) ___________________________________________   Acid Reflux  Below are some measures you can take to possibly improve acid reflux symptoms . We may have discussed some of these today in the office. Not everything on this list may apply to you   --If you are taking anti-reflux ( GERD) medication be sure to take it 30 minutes before breakfast and if taking twice daily then also second dose should be 30 minutes before dinner.   --Avoid late meals / bedtime snacks.   --Avoid trigger foods ( foods which you know tend to aggravate you reflux symptoms). Some common trigger foods include spicy foods, fatty foods, acidic foods, chocolate and caffeine.  --Elevate the head of bed 6-8 inches on blocks or bricks. If not able to elevate the head of the bed consider purchasing a wedge pillow to sleep on.    --Weight reduction / maintain a healthy BMI ( body mass index) may be help with reflux symptoms  --Sometimes with the above mentioned "lifestyle changes" patients are able to reduce the amount of GERD medications they take. Our goal is to have you on the lowest effective dose of medication

## 2022-09-25 ENCOUNTER — Encounter: Payer: Self-pay | Admitting: Nurse Practitioner

## 2022-09-26 NOTE — Progress Notes (Signed)
Agree with assessment/plan.  Raj Gupta, MD [facility] [phone]  

## 2022-09-28 ENCOUNTER — Encounter: Payer: Self-pay | Admitting: Nurse Practitioner

## 2022-09-28 ENCOUNTER — Ambulatory Visit (INDEPENDENT_AMBULATORY_CARE_PROVIDER_SITE_OTHER): Payer: BC Managed Care – PPO | Admitting: Nurse Practitioner

## 2022-09-28 VITALS — BP 134/80 | HR 94 | Ht 64.0 in | Wt 202.0 lb

## 2022-09-28 DIAGNOSIS — R490 Dysphonia: Secondary | ICD-10-CM | POA: Diagnosis not present

## 2022-09-28 DIAGNOSIS — R918 Other nonspecific abnormal finding of lung field: Secondary | ICD-10-CM

## 2022-09-28 DIAGNOSIS — F17200 Nicotine dependence, unspecified, uncomplicated: Secondary | ICD-10-CM

## 2022-09-28 DIAGNOSIS — K219 Gastro-esophageal reflux disease without esophagitis: Secondary | ICD-10-CM

## 2022-09-28 DIAGNOSIS — J309 Allergic rhinitis, unspecified: Secondary | ICD-10-CM | POA: Insufficient documentation

## 2022-09-28 DIAGNOSIS — J4540 Moderate persistent asthma, uncomplicated: Secondary | ICD-10-CM | POA: Diagnosis not present

## 2022-09-28 DIAGNOSIS — J432 Centrilobular emphysema: Secondary | ICD-10-CM

## 2022-09-28 DIAGNOSIS — R911 Solitary pulmonary nodule: Secondary | ICD-10-CM

## 2022-09-28 DIAGNOSIS — J3089 Other allergic rhinitis: Secondary | ICD-10-CM

## 2022-09-28 MED ORDER — TRELEGY ELLIPTA 200-62.5-25 MCG/ACT IN AEPB: 1.0000 | INHALATION_SPRAY | Freq: Every day | RESPIRATORY_TRACT | 5 refills | Status: AC

## 2022-09-28 NOTE — Assessment & Plan Note (Signed)
Smoking cessation advised.

## 2022-09-28 NOTE — Progress Notes (Signed)
@Patient  ID: Haley Nielsen, female    DOB: November 25, 1965, 57 y.o.   MRN: [id number]  Chief Complaint  Patient presents with   Follow-up    Pt states she feels the same, needs refills on trelegy.     Referring provider: Gabriel Earing, FNP  HPI: 57 year old female, active smoker followed for asthma and UACS. She is a patient of Dr. Thurston Hole and last seen in office 06/25/2022. Past medical history significant for HTN, GERD, vasovagal syncope, HLD, obesity, prediabetes, anxiety.   TEST/EVENTS:  12/01/2021 CXR: linear scarring in the lingula; lungs otherwise clear  04/2022 eos 300  06/25/2022 PFT: FVC 68, FEV1 71, ratio 79, TLC 100, DLCOcor 73.  No BD.  Moderate diffusing defect 08/03/2022 CT chest w contrast: trace pericardial effusion. Atherosclerosis. Small hiatal hernia. Centrilobular emphysema. Strandy atelectasis. A 4 mm nodule RUL. 3 mm LLL. Fatty infiltration of the liver. Right adrenal measuring 1.7 cm, adenoma.   12/01/2021: OV with Dr. Sherene Sires for pulmonary consult.  Active smoker.  Has chronic problems with rhinitis/cough which is worse with pollen.  Did not use any inhalers in high school but has as an adult.  Referred given concern for COPD.  Does have to stop frequently when walking up a hill.  Has not noticed a huge difference on the Trelegy.  Cough is better on Trelegy.  Continues to have globus sensation.  Typically uses [place] when working.  Question asthma given timeframe with onset in childhood.  Plan to discontinue ACE inhibitor due to hoarseness and upper airway pattern cough.  Question if DPI is contributing but she feels better on this so far.  Will set her up for PFTs for further evaluation.  Also encouraged her to work on smoking cessation.  On PPI daily for GERD.  Previous eosinophils 400; continue Claritin.  06/25/2022: OV with Sharetta Ricchio NP for follow-up after undergoing pulmonary function testing.  She did use her Trelegy a couple hours prior to the testing.  She had reduced lung  function but no formal obstruction.  No evidence of a restrictive disease.  Did have a moderate diffusing defect.  There was no reversibility after bronchodilator therapy.  She tells me today that she is feeling overall similar to how she did when she was here last time.  She does feel like the Trelegy has helped her but she continues to have a daily cough, which is occasionally productive with cream colored sputum.  She also has some voice hoarseness that is relatively unchanged.  Using her rescue inhaler about once a day.  She does feel like the Trelegy helps her breathing.  She denies any wheezing, fevers, chills, orthopnea, lower extremity swelling.  She does have chronic issues with her sinuses.  She is on Claritin.  She also uses Flonase nasal spray.  Has never had allergy testing before.  She has about 40 days left of her Trelegy.  She would like to continue this before switching to something else since it is already paid for. FeNO 7 ppb   09/28/2022: Today - follow up Patient presents today for follow up. At our last visit, we switched her to [facility] to see if this helped with the voice hoarseness she has been experiencing. She didn't notice a difference but felt like her breathing was worse when compared to the Trelegy so she went back to this. She is almost out. She does still have shortness of breath with activity. Usually longer distances or climbing stairs. She also notices that  the weather plays a role. She has an occasional cough with white phlegm. She does notice wheezing, mostly at night. She denies fevers, chills, hemoptysis, leg swelling, orthopnea, anorexia, weight loss. She does feel like the singulair has helped her some. Using albuterol a few times a week. CT scan showed two small nodules. She is not a candidate for lung cancer screening program given pack year history <20 years.   Allergies  Allergen Reactions   Penicillins Hives    Immunization History  Administered Date(s)  Administered   Influenza-Unspecified 11/10/2021   Tdap 07/21/2006, 05/21/2018, 12/26/2019   Zoster Recombinant(Shingrix) 05/22/2021, 04/02/2022    Past Medical History:  Diagnosis Date   Allergic rhinitis    Allergy    Blurry vision    Bronchospasm    Chest pain    Dizziness    Elevated CK-MB level    GERD (gastroesophageal reflux disease)    Hyperlipidemia    Hypertension    Neuropathy 05/15/2021   Syncope    Syncope    Tobacco abuse    Weakness     Tobacco History: Social History   Tobacco Use  Smoking Status Every Day   Current packs/day: 0.50   Average packs/day: 0.5 packs/day for 15.0 years (7.5 ttl pk-yrs)   Types: Cigarettes  Smokeless Tobacco Never   Ready to quit: Not Answered Counseling given: Not Answered   Outpatient Medications Prior to Visit  Medication Sig Dispense Refill   amLODipine (NORVASC) 10 MG tablet Take 1 tablet (10 mg total) by mouth daily. 90 tablet 3   aspirin EC (EQ ASPIRIN ADULT LOW DOSE) 81 MG tablet TAKE 1 TABLET BY MOUTH ONCE DAILY. SWALLOW WHOLE 90 tablet 1   atorvastatin (LIPITOR) 80 MG tablet Take 1 tablet (80 mg total) by mouth every evening. Keep on file 90 tablet 3   busPIRone (BUSPAR) 5 MG tablet Take 1 tablet (5 mg total) by mouth 2 (two) times daily. 180 tablet 1   DULoxetine (CYMBALTA) 60 MG capsule Take 1 capsule (60 mg total) by mouth daily. 90 capsule 3   ezetimibe (ZETIA) 10 MG tablet Take 1 tablet (10 mg total) by mouth daily. 90 tablet 3   famotidine (PEPCID) 20 MG tablet Take 1 tablet (20 mg total) by mouth at bedtime. 90 tablet 3   fluticasone (FLONASE) 50 MCG/ACT nasal spray Place 2 sprays into both nostrils daily. 16 g 6   gabapentin (NEURONTIN) 600 MG tablet Take 1 tablet (600 mg total) by mouth 3 (three) times daily. 270 tablet 1   HYDROcodone-acetaminophen (NORCO) 10-325 MG tablet Take 1 tablet by mouth every 8 (eight) hours as needed (for pain).     lidocaine (LIDODERM) 5 % Place 1 patch onto the skin daily.  Remove & Discard patch within 12 hours or as directed by MD 30 patch 0   linaclotide (LINZESS) 145 MCG CAPS capsule Take 1 capsule (145 mcg total) by mouth daily. Take this medication on an empty stomach. 30 capsule 3   loratadine (EQ ALL DAY ALLERGY RELIEF) 10 MG tablet Take 1 tablet (10 mg total) by mouth daily. 90 tablet 0   methocarbamol (ROBAXIN) 500 MG tablet Take 1 tablet by mouth 4 times daily 30 tablet 0   montelukast (SINGULAIR) 10 MG tablet Take 1 tablet (10 mg total) by mouth at bedtime. 30 tablet 5   Multiple Vitamin (MULTIVITAMIN WITH MINERALS) TABS tablet Take 1 tablet by mouth daily.     naproxen (NAPROSYN) 500 MG tablet Take 1 tablet  by mouth twice daily 60 tablet 0   olmesartan-hydrochlorothiazide (BENICAR HCT) 20-12.5 MG tablet Take 1 tablet by mouth daily. 90 tablet 3   Omega-3 Fatty Acids (FISH OIL) 1000 MG CAPS Take by mouth.     omeprazole (PRILOSEC) 20 MG capsule Take 1 capsule (20 mg total) by mouth daily. 90 capsule 0   traZODone (DESYREL) 50 MG tablet Take 0.5-1 tablets (25-50 mg total) by mouth at bedtime as needed for sleep. 30 tablet 3   VENTOLIN HFA 108 (90 Base) MCG/ACT inhaler INHALE 1 TO 2 PUFFS BY MOUTH EVERY 4 HOURS AS NEEDED FOR WHEEZING OR SHORTNESS OF BREATH (Patient taking differently: Inhale 1-2 puffs into the lungs every 4 (four) hours as needed for wheezing or shortness of breath.) 18 g 2   Fluticasone-Umeclidin-Vilant (TRELEGY ELLIPTA) 100-62.5-25 MCG/ACT AEPB Inhale 1 puff into the lungs daily. 1 each 11   No facility-administered medications prior to visit.     Review of Systems:   Constitutional: No weight loss or gain, night sweats, fevers, chills, fatigue, or lassitude. HEENT: No headaches, difficulty swallowing, tooth/dental problems, or sore throat. No sneezing, itching, ear ache. + nasal congestion, post nasal drip, voice hoarseness (baseline) CV:  No chest pain, orthopnea, PND, swelling in lower extremities, anasarca, dizziness, palpitations,  syncope Resp: +shortness of breath with exertion; chronic cough; wheeze. No excess mucus or change in color of mucus. No hemoptysis. No chest wall deformity GI:  No heartburn, indigestion, abdominal pain, nausea, vomiting, diarrhea, change in bowel habits, loss of appetite, bloody stools.  GU: No dysuria, change in color of urine, urgency or frequency.  Skin: No rash, lesions, ulcerations MSK:  No joint pain or swelling.   Neuro: No dizziness or lightheadedness.  Psych: No depression or anxiety. Mood stable.     Physical Exam:  BP 134/80   Pulse 94   Ht 5\' 4"  (1.626 m)   Wt 202 lb (91.6 kg)   SpO2 98%   BMI 34.67 kg/m   GEN: Pleasant, interactive, well-appearing; obese; in no acute distress. HEENT:  Normocephalic and atraumatic. PERRLA. Sclera white. Nasal turbinates pink, moist and patent bilaterally. No rhinorrhea present. Oropharynx pink and moist, without exudate or edema. No lesions, ulcerations, or postnasal drip. Hoarse voice quality  NECK:  Supple w/ fair ROM. No JVD present. Normal carotid impulses w/o bruits. Thyroid symmetrical with no goiter or nodules palpated. No lymphadenopathy.   CV: RRR, no m/r/g, no peripheral edema. Pulses intact, +2 bilaterally. No cyanosis, pallor or clubbing. PULMONARY:  Unlabored, regular breathing. Clear bilaterally A&P w/o wheezes/rales/rhonchi. No accessory muscle use.  GI: BS present and normoactive. Soft, non-tender to palpation. No organomegaly or masses detected.  MSK: No erythema, warmth or tenderness. Cap refil <2 sec all extrem. No deformities or joint swelling noted.  Neuro: A/Ox3. No focal deficits noted.   Skin: Warm, no lesions or rashe Psych: Normal affect and behavior. Judgement and thought content appropriate.     Lab Results:  CBC    Component Value Date/Time   WBC 12.1 (H) 05/18/2022 1503   RBC 4.58 05/18/2022 1503   HGB 14.6 05/18/2022 1503   HGB 14.3 04/02/2022 0841   HCT 41.8 05/18/2022 1503   HCT 42.3  04/02/2022 0841   PLT 369 05/18/2022 1503   PLT 378 04/02/2022 0841   MCV 91.3 05/18/2022 1503   MCV 92 04/02/2022 0841   MCH 31.9 05/18/2022 1503   MCHC 34.9 05/18/2022 1503   RDW 13.4 05/18/2022 1503   RDW 12.7  04/02/2022 0841   LYMPHSABS 2.8 05/18/2022 1503   LYMPHSABS 1.9 04/02/2022 0841   MONOABS 0.6 05/18/2022 1503   EOSABS 0.3 05/18/2022 1503   EOSABS 0.2 04/02/2022 0841   BASOSABS 0.1 05/18/2022 1503   BASOSABS 0.0 04/02/2022 0841    BMET    Component Value Date/Time   NA 137 05/18/2022 1503   NA 141 04/02/2022 0841   K 3.9 05/18/2022 1503   CL 105 05/18/2022 1503   CO2 22 05/18/2022 1503   GLUCOSE 120 (H) 05/18/2022 1503   BUN 12 05/18/2022 1503   BUN 12 04/02/2022 0841   CREATININE 0.70 08/03/2022 0900   CALCIUM 9.6 05/18/2022 1503   GFRNONAA >60 05/18/2022 1503   GFRAA  06/10/2010 2207    >60        The eGFR has been calculated using the MDRD equation. This calculation has not been validated in all clinical situations. eGFR's persistently <60 mL/min signify possible Chronic Kidney Disease.    BNP No results found for: "BNP"   Imaging:  No results found.  Administration History     None          Latest Ref Rng & Units 06/25/2022   10:48 AM  PFT Results  FVC-Pre L 2.30   FVC-Predicted Pre % 68   FVC-Post L 2.30   FVC-Predicted Post % 68   Pre FEV1/FVC % % 81   Post FEV1/FCV % % 79   FEV1-Pre L 1.87   FEV1-Predicted Pre % 71   FEV1-Post L 1.82   DLCO uncorrected ml/min/mmHg 15.49   DLCO UNC% % 75   DLCO corrected ml/min/mmHg 14.96   DLCO COR %Predicted % 73   DLVA Predicted % 82   TLC L 5.06   TLC % Predicted % 100   RV % Predicted % 101     No results found for: "NITRICOXIDE"      Assessment & Plan:   Moderate persistent asthma No perceived benefit with Breztri regarding voice hoarseness. Prefers Trelegy. Will trial her on 200 mcg dose to see if this helps with wheezing and dyspnea. Advised her to notify if this causes  increased voice hoarseness and we will de-escalate to the 100 mcg inhaler. Medication education provided. Action plan in place. Smoking cessation advised. Continue trigger prevention.  Patient Instructions  Continue Albuterol inhaler 2 puffs every 6 hours as needed for shortness of breath or wheezing. Notify if symptoms persist despite rescue inhaler/neb use. Continue Trelegy 1 puff daily. Brush tongue and rinse mouth afterwards. If the increased dose causes more voice hoarseness, let me know and I will switch you back  Continue flonase nasal spray 2 sprays each nostril daily Continue omeprazole 1 capsule daily Continue Guaifenesin (mucinex) 600 mg 1-2 times a day for cough/congestion Continue Singulair (montelukast) 1 tab At bedtime for allergies/asthma   CT chest in one year  Referral to ENT for voice hoarseness    Follow up in 4 months with Dr. Sherene Sires (1st) or Katie Laakea Pereira,NP. If symptoms do not improve or worsen, please contact office for sooner follow up or seek emergency care.    Voice hoarseness Chronic voice hoarseness. No improvement with change to HFA and spacer. Likely due to chronic rhinitis and smoking. However given length of time and no improvement, will send referral to ENT for further evaluation.   Light tobacco smoker Smoking cessation advised.   Lung nodule Small pulmonary nodules. Will repeat CT chest in 1 year to assess stability.   Gastroesophageal reflux  disease Well controlled per her report  Allergic rhinitis Continue current regimen   I spent 35 minutes of dedicated to the care of this patient on the date of this encounter to include pre-visit review of records, face-to-face time with the patient discussing conditions above, post visit ordering of testing, clinical documentation with the electronic health record, making appropriate referrals as documented, and communicating necessary findings to members of the patients care team.  Noemi Chapel,  NP 09/28/2022  Pt aware and understands NP's role.

## 2022-09-28 NOTE — Assessment & Plan Note (Signed)
Chronic voice hoarseness. No improvement with change to HFA and spacer. Likely due to chronic rhinitis and smoking. However given length of time and no improvement, will send referral to ENT for further evaluation.

## 2022-09-28 NOTE — Assessment & Plan Note (Addendum)
Well controlled per her report

## 2022-09-28 NOTE — Assessment & Plan Note (Signed)
No perceived benefit with Breztri regarding voice hoarseness. Prefers Trelegy. Will trial her on 200 mcg dose to see if this helps with wheezing and dyspnea. Advised her to notify if this causes increased voice hoarseness and we will de-escalate to the 100 mcg inhaler. Medication education provided. Action plan in place. Smoking cessation advised. Continue trigger prevention.  Patient Instructions  Continue Albuterol inhaler 2 puffs every 6 hours as needed for shortness of breath or wheezing. Notify if symptoms persist despite rescue inhaler/neb use. Continue Trelegy 1 puff daily. Brush tongue and rinse mouth afterwards. If the increased dose causes more voice hoarseness, let me know and I will switch you back  Continue flonase nasal spray 2 sprays each nostril daily Continue omeprazole 1 capsule daily Continue Guaifenesin (mucinex) 600 mg 1-2 times a day for cough/congestion Continue Singulair (montelukast) 1 tab At bedtime for allergies/asthma   CT chest in one year  Referral to ENT for voice hoarseness    Follow up in 4 months with Dr. Sherene Sires (1st) or Katie Ryer Asato,NP. If symptoms do not improve or worsen, please contact office for sooner follow up or seek emergency care.

## 2022-09-28 NOTE — Assessment & Plan Note (Signed)
Small pulmonary nodules. Will repeat CT chest in 1 year to assess stability.

## 2022-09-28 NOTE — Assessment & Plan Note (Signed)
Continue current regimen

## 2022-09-28 NOTE — Patient Instructions (Addendum)
Continue Albuterol inhaler 2 puffs every 6 hours as needed for shortness of breath or wheezing. Notify if symptoms persist despite rescue inhaler/neb use. Continue Trelegy 1 puff daily. Brush tongue and rinse mouth afterwards. If the increased dose causes more voice hoarseness, let me know and I will switch you back  Continue flonase nasal spray 2 sprays each nostril daily Continue omeprazole 1 capsule daily Continue Guaifenesin (mucinex) 600 mg 1-2 times a day for cough/congestion Continue Singulair (montelukast) 1 tab At bedtime for allergies/asthma   CT chest in one year  Referral to ENT for voice hoarseness    Follow up in 4 months with Dr. Sherene Sires (1st) or Katie Vear Staton,NP. If symptoms do not improve or worsen, please contact office for sooner follow up or seek emergency care.

## 2022-09-30 ENCOUNTER — Encounter: Payer: Self-pay | Admitting: Family Medicine

## 2022-09-30 ENCOUNTER — Ambulatory Visit (INDEPENDENT_AMBULATORY_CARE_PROVIDER_SITE_OTHER): Payer: BC Managed Care – PPO | Admitting: Family Medicine

## 2022-09-30 ENCOUNTER — Other Ambulatory Visit: Payer: Self-pay | Admitting: Family Medicine

## 2022-09-30 VITALS — BP 135/86 | HR 99 | Temp 97.9°F | Ht 64.0 in | Wt 198.4 lb

## 2022-09-30 DIAGNOSIS — J309 Allergic rhinitis, unspecified: Secondary | ICD-10-CM

## 2022-09-30 DIAGNOSIS — E782 Mixed hyperlipidemia: Secondary | ICD-10-CM | POA: Diagnosis not present

## 2022-09-30 DIAGNOSIS — J432 Centrilobular emphysema: Secondary | ICD-10-CM

## 2022-09-30 DIAGNOSIS — I10 Essential (primary) hypertension: Secondary | ICD-10-CM

## 2022-09-30 DIAGNOSIS — R7303 Prediabetes: Secondary | ICD-10-CM | POA: Diagnosis not present

## 2022-09-30 DIAGNOSIS — I700 Atherosclerosis of aorta: Secondary | ICD-10-CM

## 2022-09-30 DIAGNOSIS — R531 Weakness: Secondary | ICD-10-CM | POA: Diagnosis not present

## 2022-09-30 DIAGNOSIS — K219 Gastro-esophageal reflux disease without esophagitis: Secondary | ICD-10-CM

## 2022-09-30 DIAGNOSIS — Z23 Encounter for immunization: Secondary | ICD-10-CM

## 2022-09-30 DIAGNOSIS — M62831 Muscle spasm of calf: Secondary | ICD-10-CM

## 2022-09-30 DIAGNOSIS — M5442 Lumbago with sciatica, left side: Secondary | ICD-10-CM

## 2022-09-30 DIAGNOSIS — M5126 Other intervertebral disc displacement, lumbar region: Secondary | ICD-10-CM

## 2022-09-30 DIAGNOSIS — M5416 Radiculopathy, lumbar region: Secondary | ICD-10-CM

## 2022-09-30 LAB — BAYER DCA HB A1C WAIVED: HB A1C (BAYER DCA - WAIVED): 5.6 % (ref 4.8–5.6)

## 2022-09-30 MED ORDER — FLUTICASONE PROPIONATE 50 MCG/ACT NA SUSP: 2.0000 | Freq: Every day | NASAL | 6 refills | Status: DC

## 2022-09-30 MED ORDER — CYCLOBENZAPRINE HCL 10 MG PO TABS
10.0000 mg | ORAL_TABLET | Freq: Three times a day (TID) | ORAL | 0 refills | Status: DC | PRN
Start: 2022-09-30 — End: 2022-10-01

## 2022-09-30 MED ORDER — METHOCARBAMOL 500 MG PO TABS
500.0000 mg | ORAL_TABLET | Freq: Four times a day (QID) | ORAL | 0 refills | Status: DC
Start: 2022-09-30 — End: 2022-09-30

## 2022-09-30 NOTE — Progress Notes (Signed)
Established Patient Office Visit  Subjective   Patient ID: Haley Nielsen, female    DOB: 01/04/1966  Age: 57 y.o. MRN: [id number]  Chief Complaint  Patient presents with   Medical Management of Chronic Issues   Hypertension    HPI  HTN Complaint with meds - Yes Current Medications - amlodipine 5 mg, olmesartan 20, HCTZ 12.5 Checking BP at home ranging 150s/90s Pertinent ROS:  Headache - Yes Fatigue - No Chest pain - intermittent sharp stabbing pain Dyspnea - stable Palpitations - No LE edema - No  2. HLD On statin. Denies side effects.   3. Prediabetes Regular diet, no exercise.   4. Asthma/COPD Currently using albuterol a few times a day. Compliant with trelegy. Just saw pulmonology. Also has been referred to ENT.   5. GERD Managed by GI. Has upcoming EGD with GI. Sometimes has dysphagia with food, pills. Denies other symptoms.   6. Back Pain She continues to have significant pain. Still requring assistance with ADLs, inability to stand for more than a few minutes. She has an appt for an another injection on the 28th. Has upcoming nerve conduction studies. Has had MRI on cervical spine, awaiting results. Surgeon awaiting further evaluation with nerve conduction studies and MRI of C spine before proceeding.   7. Anxiety/depression Symptoms have been better over all but still up and down.      09/30/2022    2:32 PM 09/02/2022    1:32 PM 08/05/2022    3:48 PM  Depression screen PHQ 2/9  Decreased Interest 2 2 1   Down, Depressed, Hopeless 1 1 1   PHQ - 2 Score 3 3 2   Altered sleeping 1 3 1   Tired, decreased energy 2 2 2   Change in appetite 1 2 1   Feeling bad or failure about yourself  1 1 1   Trouble concentrating 1 1 1   Moving slowly or fidgety/restless 1 1 1   Suicidal thoughts 0 0 0  PHQ-9 Score 10 13 9   Difficult doing work/chores Not difficult at all Somewhat difficult Not difficult at all      09/30/2022    2:31 PM 09/02/2022    1:33 PM 08/05/2022    3:48  PM 07/17/2022    1:27 PM  GAD 7 : Generalized Anxiety Score  Nervous, Anxious, on Edge 1 1 1 1   Control/stop worrying 1 2 1 2   Worry too much - different things 1 2 2 2   Trouble relaxing 2 3 1 3   Restless 1 2 1 2   Easily annoyed or irritable 0 2 1 2   Afraid - awful might happen 1 1 1  0  Total GAD 7 Score 7 13 8 12   Anxiety Difficulty Not difficult at all Somewhat difficult Not difficult at all Not difficult at all      Past Medical History:  Diagnosis Date   Allergic rhinitis    Allergy    Blurry vision    Bronchospasm    Chest pain    Dizziness    Elevated CK-MB level    GERD (gastroesophageal reflux disease)    Hyperlipidemia    Hypertension    Neuropathy 05/15/2021   Syncope    Syncope    Tobacco abuse    Weakness       ROS As per HPI.    Objective:     BP 135/86   Pulse 99   Temp 97.9 F (36.6 C) (Temporal)   Ht 5\' 4"  (1.626 m)   Wt 198 lb  6 oz (90 kg)   SpO2 98%   BMI 34.05 kg/m  BP Readings from Last 3 Encounters:  09/30/22 135/86  09/28/22 134/80  09/24/22 130/82      Physical Exam Vitals and nursing note reviewed.  Constitutional:      General: She is not in acute distress.    Appearance: She is not ill-appearing, toxic-appearing or diaphoretic.  HENT:     Mouth/Throat:     Mouth: Mucous membranes are moist.     Pharynx: Oropharynx is clear.  Eyes:     General:        Right eye: No discharge.        Left eye: No discharge.     Extraocular Movements: Extraocular movements intact.     Pupils: Pupils are equal, round, and reactive to light.  Neck:     Vascular: No carotid bruit.  Cardiovascular:     Rate and Rhythm: Regular rhythm.     Heart sounds: Normal heart sounds. No murmur heard. Pulmonary:     Effort: Pulmonary effort is normal. No respiratory distress.     Breath sounds: Normal breath sounds. No wheezing or rhonchi.  Abdominal:     General: Bowel sounds are normal. There is no distension.     Palpations: Abdomen is soft.      Tenderness: There is no abdominal tenderness.  Musculoskeletal:        General: No swelling.     Cervical back: Normal range of motion and neck supple. No rigidity or tenderness.     Lumbar back: Tenderness and bony tenderness present. No swelling or edema. Positive left straight leg raise test.     Right lower leg: No edema.     Left lower leg: No edema.  Skin:    General: Skin is warm and dry.  Neurological:     Mental Status: She is alert and oriented to person, place, and time.     Cranial Nerves: No facial asymmetry.     Sensory: Sensation is intact.     Motor: Weakness (4/5 LUE and LLE) present.     Gait: Gait abnormal (antalgic gait).  Psychiatric:        Mood and Affect: Mood normal.        Behavior: Behavior normal.      No results found for any visits on 09/30/22.    The 10-year ASCVD risk score (Arnett DK, et al., 2019) is: 10.7%    Assessment & Plan:   Cindia was seen today for medical management of chronic issues and hypertension.  Diagnoses and all orders for this visit:  Primary hypertension Well controlled on current regimen.  -     CBC with Differential/Platelet -     CMP14+EGFR  Mixed hyperlipidemia Last LDL 78. Continue statin.   Prediabetes A1c pending.  -     Bayer DCA Hb A1c Waived  Left-sided weakness -- CVA ruled out Lumbar radiculopathy Protrusion of lumbar intervertebral disc Follow up with ortho and neurosurgery as scheduled. Has another injection on 10/17/22. Unsure when neurosurgery follow up is. Per neurosurgery note- awaiting MRI of cervical spine and nerve conductions studies before proceeding. Will need to remain out of work pending these follow ups as she is still having significant pain and requires assistance with ADLs.   Gastroesophageal reflux disease, unspecified whether esophagitis present Follow up with GI as scheduled for EGD.   Aortic atherosclerosis (HCC) On staitn and aspirin.   Chronic allergic rhinitis -  fluticasone (FLONASE) 50 MCG/ACT nasal spray; Place 2 sprays into both nostrils daily.  Muscle spasm of left calf -   cyclobenzaprine (FLEXERIL) 10 MG tablet; Take 1 tablet (10 mg total) by mouth 3 (three) times daily as needed for muscle spasms.  Centrilobular emphysema Continue trelegy daily, albuterol prn. Managed by pulmonology.   Encounter for immunization -     Flu vaccine trivalent PF, 6mos and older(Flulaval,Afluria,Fluarix,Fluzone)   Return in about 6 weeks (around 11/11/2022) for back pain follow up.   The patient indicates understanding of these issues and agrees with the plan.  Gabriel Earing, FNP

## 2022-10-01 LAB — CBC WITH DIFFERENTIAL/PLATELET
Basophils Absolute: 0.1 10*3/uL (ref 0.0–0.2)
Basos: 1 %
EOS (ABSOLUTE): 0.3 10*3/uL (ref 0.0–0.4)
Eos: 3 %
Hematocrit: 43.7 % (ref 34.0–46.6)
Hemoglobin: 14.4 g/dL (ref 11.1–15.9)
Immature Grans (Abs): 0.1 10*3/uL (ref 0.0–0.1)
Immature Granulocytes: 1 %
Lymphocytes Absolute: 2.6 10*3/uL (ref 0.7–3.1)
Lymphs: 25 %
MCH: 30.4 pg (ref 26.6–33.0)
MCHC: 33 g/dL (ref 31.5–35.7)
MCV: 92 fL (ref 79–97)
Monocytes Absolute: 0.8 10*3/uL (ref 0.1–0.9)
Monocytes: 7 %
Neutrophils Absolute: 6.6 10*3/uL (ref 1.4–7.0)
Neutrophils: 63 %
Platelets: 381 10*3/uL (ref 150–450)
RBC: 4.74 x10E6/uL (ref 3.77–5.28)
RDW: 13 % (ref 11.7–15.4)
WBC: 10.3 10*3/uL (ref 3.4–10.8)

## 2022-10-01 LAB — CMP14+EGFR
ALT: 27 IU/L (ref 0–32)
AST: 23 IU/L (ref 0–40)
Albumin: 4.4 g/dL (ref 3.8–4.9)
Alkaline Phosphatase: 106 IU/L (ref 44–121)
BUN/Creatinine Ratio: 12 (ref 9–23)
BUN: 9 mg/dL (ref 6–24)
Bilirubin Total: 0.3 mg/dL (ref 0.0–1.2)
CO2: 24 mmol/L (ref 20–29)
Calcium: 10.2 mg/dL (ref 8.7–10.2)
Chloride: 101 mmol/L (ref 96–106)
Creatinine, Ser: 0.75 mg/dL (ref 0.57–1.00)
Globulin, Total: 2.8 g/dL (ref 1.5–4.5)
Glucose: 94 mg/dL (ref 70–99)
Potassium: 4.3 mmol/L (ref 3.5–5.2)
Sodium: 142 mmol/L (ref 134–144)
Total Protein: 7.2 g/dL (ref 6.0–8.5)
eGFR: 93 mL/min/{1.73_m2} (ref >59)

## 2022-10-01 MED ORDER — CYCLOBENZAPRINE HCL 10 MG PO TABS
10.0000 mg | ORAL_TABLET | Freq: Three times a day (TID) | ORAL | 0 refills | Status: DC | PRN
Start: 2022-10-01 — End: 2022-12-25

## 2022-10-01 NOTE — Addendum Note (Signed)
Addended by: Julious Payer D on: 10/01/2022 10:00 AM   Modules accepted: Orders

## 2022-10-01 NOTE — Telephone Encounter (Signed)
Name from pharmacy: CYCLOBENZAPRINE 10MG    TAB  Pharmacy comment: patient has same day  order for methocarbamol.  duplicate therapy.  please clarify,  thanks.

## 2022-10-01 NOTE — Telephone Encounter (Signed)
Refill failed, resent 

## 2022-10-17 ENCOUNTER — Other Ambulatory Visit: Payer: Self-pay | Admitting: Family Medicine

## 2022-10-17 DIAGNOSIS — M5416 Radiculopathy, lumbar region: Secondary | ICD-10-CM | POA: Diagnosis not present

## 2022-10-19 ENCOUNTER — Other Ambulatory Visit (HOSPITAL_COMMUNITY): Payer: Self-pay

## 2022-10-19 ENCOUNTER — Encounter: Payer: Self-pay | Admitting: Family Medicine

## 2022-10-19 ENCOUNTER — Telehealth: Payer: Self-pay | Admitting: Pharmacy Technician

## 2022-10-19 NOTE — Telephone Encounter (Signed)
Pharmacy Patient Advocate Encounter   Received notification from CoverMyMeds that prior authorization for [facility] is required/requested.   Insurance verification completed.   The patient is insured through [facility] .   Per test claim: PA required; PA submitted to [employer] via CoverMyMeds Key/confirmation #/EOC [id number] Status is pending

## 2022-10-21 DIAGNOSIS — M545 Low back pain, unspecified: Secondary | ICD-10-CM | POA: Diagnosis not present

## 2022-10-21 DIAGNOSIS — M5416 Radiculopathy, lumbar region: Secondary | ICD-10-CM | POA: Diagnosis not present

## 2022-10-22 ENCOUNTER — Telehealth: Payer: Self-pay | Admitting: Family Medicine

## 2022-10-22 DIAGNOSIS — M5416 Radiculopathy, lumbar region: Secondary | ICD-10-CM | POA: Diagnosis not present

## 2022-10-24 ENCOUNTER — Other Ambulatory Visit: Payer: Self-pay | Admitting: Family Medicine

## 2022-10-24 DIAGNOSIS — J309 Allergic rhinitis, unspecified: Secondary | ICD-10-CM

## 2022-10-26 DIAGNOSIS — Z0279 Encounter for issue of other medical certificate: Secondary | ICD-10-CM

## 2022-10-26 NOTE — Telephone Encounter (Signed)
Pt pd for forms

## 2022-10-28 NOTE — Telephone Encounter (Signed)
Pharmacy Patient Advocate Encounter  Received notification from [facility] that Prior Authorization for [facility] has been DENIED.  Full denial letter will be uploaded to the media tab. See denial reason below.   PA #/Case ID/Reference #: [id number]

## 2022-10-29 ENCOUNTER — Other Ambulatory Visit: Payer: Self-pay

## 2022-10-29 MED ORDER — LUBIPROSTONE 24 MCG PO CAPS: 24.0000 ug | ORAL_CAPSULE | Freq: Two times a day (BID) | ORAL | 3 refills | Status: DC

## 2022-10-29 NOTE — Telephone Encounter (Signed)
Called the patient. Voicemail. Left detailed message that Linzess was denied by her health insurance. Haley Cluster, NP has changed the prescription to Amitiza 24 mcg. This may also require PA with her insurance but hopefully it will be covered. Call with any questions or concerns.

## 2022-10-29 NOTE — Telephone Encounter (Signed)
PCP completed and signed FMLA forms. They have been faxed to [employer] at fax number [phone].

## 2022-11-02 ENCOUNTER — Encounter: Payer: Self-pay | Admitting: Gastroenterology

## 2022-11-12 ENCOUNTER — Ambulatory Visit: Payer: BC Managed Care – PPO | Admitting: Family Medicine

## 2022-11-12 ENCOUNTER — Other Ambulatory Visit: Payer: Self-pay | Admitting: Family Medicine

## 2022-11-12 ENCOUNTER — Encounter: Payer: Self-pay | Admitting: Family Medicine

## 2022-11-12 VITALS — BP 151/90 | HR 82 | Temp 98.3°F | Ht 64.0 in | Wt 197.2 lb

## 2022-11-12 DIAGNOSIS — I10 Essential (primary) hypertension: Secondary | ICD-10-CM

## 2022-11-12 DIAGNOSIS — M5416 Radiculopathy, lumbar region: Secondary | ICD-10-CM

## 2022-11-12 DIAGNOSIS — M5126 Other intervertebral disc displacement, lumbar region: Secondary | ICD-10-CM | POA: Diagnosis not present

## 2022-11-12 DIAGNOSIS — K219 Gastro-esophageal reflux disease without esophagitis: Secondary | ICD-10-CM

## 2022-11-12 DIAGNOSIS — R531 Weakness: Secondary | ICD-10-CM | POA: Diagnosis not present

## 2022-11-12 DIAGNOSIS — R079 Chest pain, unspecified: Secondary | ICD-10-CM | POA: Diagnosis not present

## 2022-11-12 MED ORDER — OLMESARTAN MEDOXOMIL-HCTZ 40-12.5 MG PO TABS
1.0000 | ORAL_TABLET | Freq: Every day | ORAL | 3 refills | Status: DC
Start: 2022-11-12 — End: 2023-01-22

## 2022-11-12 MED ORDER — OMEPRAZOLE 40 MG PO CPDR
40.0000 mg | DELAYED_RELEASE_CAPSULE | Freq: Every day | ORAL | 3 refills | Status: DC
Start: 2022-11-12 — End: 2023-03-08

## 2022-11-12 NOTE — Progress Notes (Signed)
Established Patient Office Visit  Subjective   Patient ID: Haley Nielsen, female    DOB: Jun 27, 1965  Age: 57 y.o. MRN: [id number]  Chief Complaint  Patient presents with   Back Pain    HPI Haley Nielsen is her for a follow up of her back pain.    Back Pain She continues to have significant pain. Pain is 10/10. Still requring assistance with ADLs, inability to stand for more than a few minutes at a time. Also has significant discomfort with sitting and is not able to walk further than short distances. Since her last visit she has had another epidural injection that didn't provide any relief. She had had nerve conduction testing to help determine etiology of LUE weakness. She will follow up with ortho on 11/16/22 to go over results. Taking gapapentin TID, cymbalta, norco, and muscle relaxers.   2. HTN Hasn't checked BP at home lately. She has had sharp stabbing pain on the left side of her chest for the last 3 days. Improved today. Intermittent, last for a few minutes at most. Occurs with activity at at rest. Worse at night. Denies shortness of breath, edema, visual disturbances, heartburn. Endorse globus sensation, dyspepsia. On PPI and pepcid.       11/12/2022    2:47 PM 09/30/2022    2:32 PM 09/02/2022    1:32 PM  Depression screen PHQ 2/9  Decreased Interest 1 2 2   Down, Depressed, Hopeless 1 1 1   PHQ - 2 Score 2 3 3   Altered sleeping 1 1 3   Tired, decreased energy 1 2 2   Change in appetite 1 1 2   Feeling bad or failure about yourself  1 1 1   Trouble concentrating 1 1 1   Moving slowly or fidgety/restless 1 1 1   Suicidal thoughts 1 0 0  PHQ-9 Score 9 10 13   Difficult doing work/chores Not difficult at all Not difficult at all Somewhat difficult      11/12/2022    2:48 PM 09/30/2022    2:31 PM 09/02/2022    1:33 PM 08/05/2022    3:48 PM  GAD 7 : Generalized Anxiety Score  Nervous, Anxious, on Edge 1 1 1 1   Control/stop worrying 1 1 2 1   Worry too much - different things 1 1 2 2    Trouble relaxing 2 2 3 1   Restless 2 1 2 1   Easily annoyed or irritable 1 0 2 1  Afraid - awful might happen 1 1 1 1   Total GAD 7 Score 9 7 13 8   Anxiety Difficulty Not difficult at all Not difficult at all Somewhat difficult Not difficult at all       ROS As per HPI.    Objective:     BP (!) 151/90   Pulse 82   Temp 98.3 F (36.8 C) (Temporal)   Ht 5\' 4"  (1.626 m)   Wt 197 lb 4 oz (89.5 kg)   SpO2 97%   BMI 33.86 kg/m  BP Readings from Last 3 Encounters:  11/12/22 (!) 173/97  09/30/22 135/86  09/28/22 134/80      Physical Exam Vitals and nursing note reviewed.  Constitutional:      General: She is not in acute distress.    Appearance: She is not ill-appearing, toxic-appearing or diaphoretic.  HENT:     Mouth/Throat:     Mouth: Mucous membranes are moist.     Pharynx: Oropharynx is clear.  Eyes:     General:  Right eye: No discharge.        Left eye: No discharge.     Extraocular Movements: Extraocular movements intact.     Pupils: Pupils are equal, round, and reactive to light.  Neck:     Vascular: No carotid bruit.  Cardiovascular:     Rate and Rhythm: Normal rate and regular rhythm.     Heart sounds: Normal heart sounds. No murmur heard. Pulmonary:     Effort: Pulmonary effort is normal. No respiratory distress.     Breath sounds: Normal breath sounds. No wheezing or rhonchi.  Abdominal:     General: Bowel sounds are normal. There is no distension.     Palpations: Abdomen is soft.     Tenderness: There is no abdominal tenderness.  Musculoskeletal:        General: No swelling.     Cervical back: Normal range of motion and neck supple. No rigidity or tenderness.     Lumbar back: Tenderness and bony tenderness present. No swelling or edema. Positive left straight leg raise test.     Right lower leg: No edema.     Left lower leg: No edema.  Skin:    General: Skin is warm and dry.  Neurological:     General: No focal deficit present.      Mental Status: She is alert and oriented to person, place, and time.     Cranial Nerves: No facial asymmetry.     Sensory: Sensation is intact.     Motor: Weakness (4/5 LUE and LLE) present.     Gait: Gait abnormal (antalgic gait).  Psychiatric:        Mood and Affect: Mood normal.        Behavior: Behavior normal.      No results found for any visits on 11/12/22.    The 10-year ASCVD risk score (Arnett DK, et al., 2019) is: 22.5%    Assessment & Plan:   [name] was seen today for back pain.  Diagnoses and all orders for this visit:  Lumbar radiculopathy Protrusion of lumbar intervertebral disc Left-sided weakness -- CVA ruled out Managed by neurosurgery/ortho. Has follow up on 11/16/22 for nerve conduction testing results and to discussed next steps. She has not been able to return to work due to significant pain and impaired ADLs.   Chest pain, unspecified type EKG without changes from previous. Has had work up with cardiology. ? GERD -     EKG 12-Lead  Primary hypertension Uncontrolled. Will increase olmesartan to 40 mg in benicar. Monitor at home and notify for elevated readings.  -     EKG 12-Lead -     olmesartan-hydrochlorothiazide (BENICAR HCT) 40-12.5 MG tablet; Take 1 tablet by mouth daily.  Gastroesophageal reflux disease, unspecified whether esophagitis present Will increase to 40 mg daily. Continue pepcid at night. Follow up if symptoms do not improve.  -     omeprazole (PRILOSEC) 40 MG capsule; Take 1 capsule (40 mg total) by mouth daily.   Return in about 6 weeks (around 12/24/2022) for HTN, back pain follow up.   The patient indicates understanding of these issues and agrees with the plan.  Gabriel Earing, FNP

## 2022-11-13 ENCOUNTER — Encounter: Payer: BC Managed Care – PPO | Admitting: Gastroenterology

## 2022-11-13 ENCOUNTER — Telehealth: Payer: Self-pay | Admitting: Gastroenterology

## 2022-11-13 NOTE — Telephone Encounter (Signed)
Good Morning Dr. Chales Abrahams,  I called this patient this morning at 9:35  to see if she was coming for her procedure.  I did not get anyone to answer.  I left a message that if she was running late or needed to reschedule to please call us.  I will NO SHOW this Patient.   [employer]

## 2022-11-17 ENCOUNTER — Telehealth: Payer: Self-pay | Admitting: Pharmacy Technician

## 2022-11-17 ENCOUNTER — Other Ambulatory Visit (HOSPITAL_COMMUNITY): Payer: Self-pay

## 2022-11-17 NOTE — Telephone Encounter (Signed)
Pharmacy Patient Advocate Encounter   Received notification from CoverMyMeds that prior authorization for [facility] is required/requested.   Insurance verification completed.   The patient is insured through [facility] .   Per test claim: PA required; PA submitted to [employer] via CoverMyMeds Key/confirmation #/[id number] Status is pending

## 2022-11-19 ENCOUNTER — Telehealth: Payer: Self-pay | Admitting: Gastroenterology

## 2022-11-19 ENCOUNTER — Encounter: Payer: BC Managed Care – PPO | Admitting: Gastroenterology

## 2022-11-19 NOTE — Telephone Encounter (Signed)
Good Morning Dr. Chales Abrahams,  I called t;his patient today at 10:15 am to see if she was coming for her procedure, she did not answer so I left a message to call us if she was running late or if she need to reschedule.  She No SHOWED last appointment.  I will NO SHOW her.  [employer]

## 2022-11-27 ENCOUNTER — Encounter: Payer: Self-pay | Admitting: Gastroenterology

## 2022-12-01 NOTE — Progress Notes (Unsigned)
ENT CONSULT:  Reason for Consult: chronic dysphonia 4-5 months   Discussed the use of AI scribe software for clinical note transcription with the patient, who gave verbal consent to proceed.   History of Present Illness   The patient is a 57 yoF, with a history of smoking, asthma, and significant reflux, presents with chronic voice changes that have been ongoing for approximately four to five months. The voice changes were not preceded by any notable events such as surgery, illness, or laryngitis. The patient reports that the voice changes seem to fluctuate, with most times being worse, and there is no specific time of day when the voice seems to improve.  The patient has a history of smoking and currently smokes three to four cigarettes a day, but is attempting to quit. They are on two daily inhalers for asthma management but have never been hospitalized for asthma exacerbation or severe shortness of breath.  The patient also reports occasional difficulty swallowing, not only with solid foods but also with saliva and medication. No coughing or choking on food or liquids, and tolerating regular diet right now. They are currently on two medications for reflux, both taken in the morning. Despite this, they still experience severe heartburn symptoms and have attempted dietary changes and sleeping with the head of the bed elevated to manage these symptoms.  The patient also reports occasional shortness of breath,when active. They have noticed an increase in shortness of breath when walking, which they attribute to chronic back pain and prolonged inactivity 2/2 back pain/deconditioning.  The patient has a history of a heart attack and a suspected stroke due to high blood pressure. They have not been diagnosed with any thyroid problems or sleep apnea. The patient is currently using Flonase nasal spray for allergies.      Records Reviewed:  GI note from 09/24/22 57 y.o.  female known to Dr. Chales Abrahams.  She  was last seen (by me) 07/14/2022 for evaluation of constipation, GERD, abdominal pain versus musculoskeletal pain.  Here for follow-up   Chronic constipation, likely medication related.  She requires several doses of opioids daily for chronic back pain.  On other constipating medications as well. No improvement after trial of fiber, stool softeners and MiraLAX.  She drinks several bottles of water a day.  Stools are very hard and difficult to evacuate.   -- Trial of Linzess 145 mcg daily on empty stomach .  -- Continue with good hydration  -- Consider pelvic floor PT, will hold off for now.  On DRE she seems to have good perineal descent when bearing down. Right now will focus on softening stool for easier evacuation.    Chronic GERD on PPI + H2 blocker having breakthrough symptoms and intermittent solid food dysphagia. No prior upper endoscopic evaluation.  --Schedule for EGD. The risks and benefits of EGD with possible biopsies were discussed with the patient who agrees to proceed.  --Swallowing precautions discussed. Advised to eat slowly, chew food well before swallowing. Drink  liquids in between each bite to avoid food impaction. --Continue anti-reflux measures such as avoidance of late meals / bedtime snacks, HOB elevation (or use of wedge pillow), weight reduction ( if applicable)  / maintaining a healthy BMI ( body mass index),  and avoidance of trigger foods and caffeine.  -- For now continue PPI in a.m. and famotidine at bedtime  Office note by Micheline Maze NP [facility] 09/28/22 57 year old female, active smoker followed for asthma and UACS.  She is a patient of Dr. Thurston Hole and last seen in office 06/25/2022. Past medical history significant for HTN, GERD, vasovagal syncope, HLD, obesity, prediabetes, anxiety.    TEST/EVENTS:  12/01/2021 CXR: linear scarring in the lingula; lungs otherwise clear  04/2022 eos 300  06/25/2022 PFT: FVC 68, FEV1 71, ratio 79, TLC 100, DLCOcor 73.  No BD.   Moderate diffusing defect 08/03/2022 CT chest w contrast: trace pericardial effusion. Atherosclerosis. Small hiatal hernia. Centrilobular emphysema. Strandy atelectasis. A 4 mm nodule RUL. 3 mm LLL. Fatty infiltration of the liver. Right adrenal measuring 1.7 cm, adenoma.    12/01/2021: OV with Dr. Sherene Sires for pulmonary consult.  Active smoker.  Has chronic problems with rhinitis/cough which is worse with pollen.  Did not use any inhalers in high school but has as an adult.  Referred given concern for COPD.  Does have to stop frequently when walking up a hill.  Has not noticed a huge difference on the Trelegy.  Cough is better on Trelegy.  Continues to have globus sensation.  Typically uses [place] when working.  Question asthma given timeframe with onset in childhood.  Plan to discontinue ACE inhibitor due to hoarseness and upper airway pattern cough.  Question if DPI is contributing but she feels better on this so far.  Will set her up for PFTs for further evaluation.  Also encouraged her to work on smoking cessation.  On PPI daily for GERD.  Previous eosinophils 400; continue Claritin.   06/25/2022: OV with Cobb NP for follow-up after undergoing pulmonary function testing.  She did use her Trelegy a couple hours prior to the testing.  She had reduced lung function but no formal obstruction.  No evidence of a restrictive disease.  Did have a moderate diffusing defect.  There was no reversibility after bronchodilator therapy.  She tells me today that she is feeling overall similar to how she did when she was here last time.  She does feel like the Trelegy has helped her but she continues to have a daily cough, which is occasionally productive with cream colored sputum.  She also has some voice hoarseness that is relatively unchanged.  Using her rescue inhaler about once a day.  She does feel like the Trelegy helps her breathing.  She denies any wheezing, fevers, chills, orthopnea, lower extremity swelling.  She does  have chronic issues with her sinuses.  She is on Claritin.  She also uses Flonase nasal spray.  Has never had allergy testing before.  She has about 40 days left of her Trelegy.  She would like to continue this before switching to something else since it is already paid for. FeNO 7 ppb    09/28/2022: Today - follow up Patient presents today for follow up. At our last visit, we switched her to [facility] to see if this helped with the voice hoarseness she has been experiencing. She didn't notice a difference but felt like her breathing was worse when compared to the Trelegy so she went back to this. She is almost out. She does still have shortness of breath with activity. Usually longer distances or climbing stairs. She also notices that the weather plays a role. She has an occasional cough with white phlegm. She does notice wheezing, mostly at night. She denies fevers, chills, hemoptysis, leg swelling, orthopnea, anorexia, weight loss. She does feel like the singulair has helped her some. Using albuterol a few times a week. CT scan showed two small nodules. She is not a  candidate for lung cancer screening program given pack year history <20 years.     Moderate persistent asthma No perceived benefit with Breztri regarding voice hoarseness. Prefers Trelegy. Will trial her on 200 mcg dose to see if this helps with wheezing and dyspnea. Advised her to notify if this causes increased voice hoarseness and we will de-escalate to the 100 mcg inhaler. Medication education provided. Action plan in place. Smoking cessation advised. Continue trigger prevention.   Patient Instructions  Continue Albuterol inhaler 2 puffs every 6 hours as needed for shortness of breath or wheezing. Notify if symptoms persist despite rescue inhaler/neb use. Continue Trelegy 1 puff daily. Brush tongue and rinse mouth afterwards. If the increased dose causes more voice hoarseness, let me know and I will switch you back  Continue flonase nasal  spray 2 sprays each nostril daily Continue omeprazole 1 capsule daily Continue Guaifenesin (mucinex) 600 mg 1-2 times a day for cough/congestion Continue Singulair (montelukast) 1 tab At bedtime for allergies/asthma   CT chest in one year   Referral to ENT for voice hoarseness    Follow up in 4 months with Dr. Sherene Sires (1st) or Katie Cobb,NP. If symptoms do not improve or worsen, please contact office for sooner follow up or seek emergency care.      Voice hoarseness Chronic voice hoarseness. No improvement with change to HFA and spacer. Likely due to chronic rhinitis and smoking. However given length of time and no improvement, will send referral to ENT for further evaluation.    Light tobacco smoker Smoking cessation advised.    Lung nodule Small pulmonary nodules. Will repeat CT chest in 1 year to assess stability.    Gastroesophageal reflux disease Well controlled per her report   Allergic rhinitis Continue current regimen     Past Medical History:  Diagnosis Date   Allergic rhinitis    Allergy    Blurry vision    Bronchospasm    Chest pain    Dizziness    Elevated CK-MB level    GERD (gastroesophageal reflux disease)    Hyperlipidemia    Hypertension    Neuropathy 05/15/2021   Syncope    Syncope    Tobacco abuse    Weakness     Past Surgical History:  Procedure Laterality Date   TUBAL LIGATION      Family History  Problem Relation Age of Onset   Colon polyps Mother    Hyperlipidemia Mother    Hypertension Mother    Stroke Father 78   Heart attack Maternal Grandmother    Cancer Paternal Grandmother        cervical cancer   Colon cancer Neg Hx    Esophageal cancer Neg Hx    Rectal cancer Neg Hx    Stomach cancer Neg Hx    Breast cancer Neg Hx     Social History:  reports that she has been smoking cigarettes. She has a 7.5 pack-year smoking history. She has never used smokeless tobacco. She reports that she does not currently use alcohol. She reports  that she does not use drugs.  Allergies:  Allergies  Allergen Reactions   Penicillins Hives    Medications: I have reviewed the patient's current medications.  The PMH, PSH, Medications, Allergies, and SH were reviewed and updated.  ROS: Constitutional: Negative for fever, weight loss and weight gain. Cardiovascular: Negative for chest pain and dyspnea on exertion. Respiratory: Is not experiencing shortness of breath at rest. Gastrointestinal: Negative for nausea and vomiting.  Neurological: Negative for headaches. Psychiatric: The patient is not nervous/anxious  Blood pressure 123/75, pulse (!) 105, height 5\' 4"  (1.626 m), weight 193 lb (87.5 kg), SpO2 94%.  PHYSICAL EXAM:  Exam: General: Well-developed, well-nourished Communication and Voice: raspy fading voice with poor projection Respiratory Respiratory effort: Equal inspiration and expiration without stridor Cardiovascular Peripheral Vascular: Warm extremities with equal color/perfusion Eyes: No nystagmus with equal extraocular motion bilaterally Neuro/Psych/Balance: Patient oriented to person, place, and time; Appropriate mood and affect; Gait is intact with no imbalance; Cranial nerves I-XII are intact Head and Face Inspection: Normocephalic and atraumatic without mass or lesion Palpation: Facial skeleton intact without bony stepoffs Salivary Glands: No mass or tenderness Facial Strength: Facial motility symmetric and full bilaterally ENT Pinna: External ear intact and fully developed External canal: Canal is patent with intact skin Tympanic Membrane: Clear and mobile External Nose: No scar or anatomic deformity Internal Nose: Septum is deviated to the left. No polyp, or purulence. Mucosal edema and erythema present.  Bilateral inferior turbinate hypertrophy.  Lips, Teeth, and gums: Mucosa and teeth intact and viable TMJ: No pain to palpation with full mobility Oral cavity/oropharynx: No erythema or exudate, no  lesions present Nasopharynx: No mass or lesion with intact mucosa Hypopharynx: Intact mucosa without pooling of secretions Larynx Glottic: Full true vocal cord mobility without lesion or mass Supraglottic: Normal appearing epiglottis and AE folds Interarytenoid Space: No or minimal pachydermia or edema Subglottic Space: Patent without lesion or edema Neck Neck and Trachea: Midline trachea without mass or lesion Thyroid: No mass or nodularity Lymphatics: No lymphadenopathy  Procedure: Summary of Video-Laryngeal-Stroboscopy: R mid VF polyp/lesion with incomplete glottic closure, significant supraglottic compression and limited evaluation of mucosal wave, moderate post-cricoid edema/pachydermia  Preoperative diagnosis: hoarseness  Postoperative diagnosis:   same + R VF polyp, vocal fold atrophy and glottic insufficiency  Procedure: Flexible fiberoptic laryngoscopy with stroboscopy (16109)  Surgeon: Ashok Croon, MD  Anesthesia: Topical lidocaine and Afrin  Complications: None  Condition is stable throughout exam  Indications and consent:   The patient presents to the clinic with hoarseness. All the risks, benefits, and potential complications were reviewed with the patient preoperatively and informed verbal consent was obtained.  Procedure: The patient was seated upright in the exam chair.   Topical lidocaine and Afrin were applied to the nasal cavity. After adequate anesthesia had occurred, the flexible telescope was passed into the nasal cavity. The nasopharynx was patent without mass or lesion. The scope was passed behind the soft palate and directed toward the base of tongue. The base of tongue was visualized and was symmetric with no apparent masses or abnormal appearing tissue. There were no signs of a mass or pooling of secretions in the piriform sinuses. The supraglottic structures were normal.  The true vocal cords are mobile. The medial edges were with right mid VF  lesion/polyp. Closure was incomplete. Periodicity present. The mucosal wave and amplitude were limited 2/2 incomplete glottic closure. There is moderate interarytenoid pachydermia and post cricoid edema. The mucosa appears with R VF lesion/polyp.   The laryngoscope was then slowly withdrawn and the patient tolerated the procedure well. There were no complications or blood loss.   Studies Reviewed: CT chest 08/03/22 MPRESSION: 1. No acute intrathoracic process. 2. Emphysema. 3. Bilateral pulmonary nodules measuring up to 4 mm. No follow-up needed if patient is low-risk (and has no known or suspected primary neoplasm). Non-contrast chest CT can be considered in 12 months if patient is high-risk.   Assessment/Plan:  Encounter Diagnoses  Name Primary?   Dysphonia Yes   Vocal fold atrophy    Vocal cord polyp    Glottic insufficiency     Assessment and Plan    Vocal Cord Polyp Chronic voice changes for 4-5 months. Examination revealed a polyp on the right vocal cord, contributing to hoarseness and raspy voice. Likely benign but requires excision for definitive diagnosis and symptom relief. Discussed surgery, potential need for voice therapy post-surgery, and use of filler gel post-excision to aid healing and prevent compensatory behaviors of supraglottic compression/address vocal fold atrophy. Polyp will be sent to pathology for confirmation. - Schedule surgery for polyp excision - Obtain insurance preapproval for surgery and pre-op clearance 2/2 hx of MI and stroke - Arrange preoperative visit or phone call for medication and preop clearance due to history of heart attack and possible stroke - Consider voice therapy post-surgery  Gastroesophageal Reflux Disease (GERD) Significant reflux with severe heartburn despite Pepcid and Prilosec. Likely contributing to vocal fold swelling/post-cricoid edema/pachydermia. Discussed use of Reflux Gourmet to complement current medications and reduce  symptoms. - Continue Prilosec and famotidine at current dose and frequency - Recommend Reflux Gourmet after meals - continue diet and lifestyle changes to minimize reflux - will consider esophagram in the future (hx of hiatal hernia per report) to evaluate for other risk factors for severe heartburn and to evaluate occasional dysphagia sx  Smoking Current smoker with a history of quitting for 10 years and restarting a year ago. Smoking cessation is crucial for overall health and to improve voice and reflux symptoms. Discussed smoking cessation program at [facility] and benefits of quitting. - Encourage smoking cessation - Offer referral to smoking cessation program at [facility] - she is confident she can quit on her own - 3 min spent counseling the patient about smoking cessation  General Health Maintenance Emphasized dietary changes and sleeping with the head of the bed elevated to manage reflux symptoms. - Encourage dietary changes and sleeping with the head of the bed elevated  Nasal congestion and suspected environmental allergies  - continue Flonase 2 puffs b/l nares BID  Follow-up - Follow-up with surgery scheduler and surgical PA for preoperative clearance   Thank you for allowing me to participate in the care of this patient. Please do not hesitate to contact me with any questions or concerns.   Ashok Croon, MD Otolaryngology [facility] Phone: [phone] Fax: [phone]    12/02/2022, 5:11 PM

## 2022-12-01 NOTE — Telephone Encounter (Signed)
Pharmacy Patient Advocate Encounter  Received notification from [facility] that Prior Authorization for Amitiza  has been DENIED.  Full denial letter will be uploaded to the media tab. See denial reason below.   PA #/Case ID/Reference #:

## 2022-12-02 ENCOUNTER — Other Ambulatory Visit: Payer: Self-pay

## 2022-12-02 ENCOUNTER — Ambulatory Visit (INDEPENDENT_AMBULATORY_CARE_PROVIDER_SITE_OTHER): Payer: Self-pay | Admitting: Otolaryngology

## 2022-12-02 ENCOUNTER — Encounter (INDEPENDENT_AMBULATORY_CARE_PROVIDER_SITE_OTHER): Payer: Self-pay | Admitting: Otolaryngology

## 2022-12-02 VITALS — BP 123/75 | HR 105 | Ht 64.0 in | Wt 193.0 lb

## 2022-12-02 DIAGNOSIS — F17210 Nicotine dependence, cigarettes, uncomplicated: Secondary | ICD-10-CM

## 2022-12-02 DIAGNOSIS — F17200 Nicotine dependence, unspecified, uncomplicated: Secondary | ICD-10-CM

## 2022-12-02 DIAGNOSIS — J383 Other diseases of vocal cords: Secondary | ICD-10-CM

## 2022-12-02 DIAGNOSIS — R490 Dysphonia: Secondary | ICD-10-CM | POA: Diagnosis not present

## 2022-12-02 DIAGNOSIS — K219 Gastro-esophageal reflux disease without esophagitis: Secondary | ICD-10-CM | POA: Diagnosis not present

## 2022-12-02 DIAGNOSIS — J381 Polyp of vocal cord and larynx: Secondary | ICD-10-CM

## 2022-12-02 DIAGNOSIS — R0981 Nasal congestion: Secondary | ICD-10-CM

## 2022-12-02 MED ORDER — TRULANCE 3 MG PO TABS
3.0000 mg | ORAL_TABLET | Freq: Every day | ORAL | 3 refills | Status: AC
Start: 2022-12-02 — End: 2023-04-01

## 2022-12-02 NOTE — Telephone Encounter (Signed)
Left message to call back to schedule tele pre op appt.  

## 2022-12-02 NOTE — Telephone Encounter (Signed)
   Name: Haley Nielsen  DOB: 03-27-1965  MRN: [id number]  Primary Cardiologist: None   Preoperative team, please contact this patient and set up a phone call appointment for further preoperative risk assessment. Please obtain consent and complete medication review. Thank you for your help. Last seen by Dr. Antoine Poche on 09/07/2022.  I confirm that guidance regarding antiplatelet and oral anticoagulation therapy has been completed and, if necessary, noted below.  Per office protocol, if patient is without any new symptoms or concerns at the time of their virtual visit, he/she may hold ASA for 7 days prior to procedure. Please resume ASA as soon as possible postprocedure, at the discretion of the surgeon.    I also confirmed the patient resides in the state of [place]. As per [facility] Medical Board telemedicine laws, the patient must reside in the state in which the provider is licensed.   Joni Reining, NP 12/02/2022, 4:11 PM [facility]

## 2022-12-02 NOTE — Telephone Encounter (Signed)
Haley Nielsen, please advise on an alternative medication for this patient.

## 2022-12-02 NOTE — Patient Instructions (Signed)
-   schedule your surgery - continue Prilosec and Famotidine - please try to quit smoking  - continue Famotidine   - Take Reflux Gourmet (natural supplement available on Amazon) to help with symptoms of chronic throat irritation

## 2022-12-02 NOTE — Telephone Encounter (Signed)
   Pre-operative Risk Assessment    Patient Name: Haley Nielsen  DOB: March 04, 1965 MRN: [id number]  Last office visit: 07/08/2022    Request for Surgical Clearance    Procedure:   DML bronch and VF polyp excision  Date of Surgery:  Clearance TBD                                 Surgeon:  Dr. Ashok Croon Surgeon's Group or Practice Name:  [facility]  Phone number:  [phone] Fax number:  [phone]   Type of Clearance Requested:   - Medical  - Pharmacy:  Hold Aspirin 81mg    Type of Anesthesia:  General     Additional requests/questions:    [name]   12/02/2022, 3:55 PM

## 2022-12-02 NOTE — Telephone Encounter (Signed)
Trulance 3 mg was sent to Pharmacy. Patient has been notified and is aware to call our office if she has any issues receiving this medication.

## 2022-12-03 ENCOUNTER — Other Ambulatory Visit (HOSPITAL_COMMUNITY): Payer: Self-pay

## 2022-12-03 ENCOUNTER — Telehealth: Payer: Self-pay | Admitting: Pharmacy Technician

## 2022-12-03 NOTE — Telephone Encounter (Signed)
Pharmacy Patient Advocate Encounter   Received notification from CoverMyMeds that prior authorization for Trulance is required/requested.   Insurance verification completed.   The patient is insured through [facility] .   Per test claim: PA required; PA started via CoverMyMeds. KEY [id number] . Waiting for clinical questions to populate.

## 2022-12-04 ENCOUNTER — Other Ambulatory Visit (HOSPITAL_COMMUNITY): Payer: Self-pay

## 2022-12-04 NOTE — Telephone Encounter (Signed)
Clinical questions have been answered and PA submitted. PA currently Pending. [id number]

## 2022-12-08 ENCOUNTER — Other Ambulatory Visit (HOSPITAL_COMMUNITY): Payer: Self-pay

## 2022-12-08 ENCOUNTER — Telehealth: Payer: Self-pay | Admitting: Pharmacy Technician

## 2022-12-08 NOTE — Telephone Encounter (Signed)
Pharmacy Patient Advocate Encounter  Received notification from [facility] that Prior Authorization for Trulance has been APPROVED from   to 12/04/23  However test claim shows this ins is terminated.   PA #/Case ID/Reference #: PA Case ID #: [id number]

## 2022-12-08 NOTE — Telephone Encounter (Addendum)
Pharmacy Patient Advocate Encounter  (NEW pa started. PA was done on previous ins. Previous test claim did not show it had terminated) Received notification from CoverMyMeds that prior authorization for Trulance is required/requested.   Insurance verification completed.   The patient is insured through [employer] .   Per test claim: PA required; PA submitted to above mentioned insurance via CoverMyMeds Key/confirmation #/EOC  [id number] Status is pending

## 2022-12-08 NOTE — Telephone Encounter (Signed)
Pharmacy Patient Advocate Encounter *Pt's ins changed.* Received notification from [employer] that Prior Authorization for Trulance has been DENIED.  Full denial letter will be uploaded to the media tab. See denial reason below.   Linzess & Lubiprostone both still require a PA, per test claim.  PA #/Case ID/Reference #: PA Case ID #: [id number]

## 2022-12-10 NOTE — Telephone Encounter (Signed)
2nd attempt to reach the pt to set up tele pre op appt.  

## 2022-12-11 ENCOUNTER — Telehealth: Payer: Self-pay | Admitting: *Deleted

## 2022-12-11 NOTE — Telephone Encounter (Signed)
3rd attempt to reach pt to schedule tele visit. No answer. Lvm. Will remove from the pool until patient calls back

## 2022-12-11 NOTE — Telephone Encounter (Addendum)
Patient states she has not heard anything from the pharmacy and she recently checked with them as of yesterday. Informed the patient that we will try to have the medication Amitiza pre-authorized so that hopefully she will be able to obtain the medication. Not certain if the patient understood everything she was informed.

## 2022-12-16 ENCOUNTER — Other Ambulatory Visit (HOSPITAL_COMMUNITY): Payer: Self-pay

## 2022-12-16 ENCOUNTER — Telehealth: Payer: Self-pay | Admitting: Pharmacy Technician

## 2022-12-16 NOTE — Telephone Encounter (Signed)
Pharmacy Patient Advocate Encounter   Received notification from Physician's Office that prior authorization for AMITIZA is required/requested.   Insurance verification completed.   The patient is insured through [facility] .   Per test claim: PA required; PA submitted to above mentioned insurance via CoverMyMeds Key/confirmation #/EOC [id number] Status is pending

## 2022-12-18 ENCOUNTER — Other Ambulatory Visit (HOSPITAL_COMMUNITY): Payer: Self-pay

## 2022-12-21 ENCOUNTER — Ambulatory Visit: Payer: BC Managed Care – PPO | Admitting: Family Medicine

## 2022-12-21 NOTE — Telephone Encounter (Signed)
Pre- authorization was sent 12/16/22, still pending.

## 2022-12-22 DIAGNOSIS — K219 Gastro-esophageal reflux disease without esophagitis: Secondary | ICD-10-CM | POA: Diagnosis not present

## 2022-12-22 DIAGNOSIS — Z8249 Family history of ischemic heart disease and other diseases of the circulatory system: Secondary | ICD-10-CM | POA: Diagnosis not present

## 2022-12-22 DIAGNOSIS — E785 Hyperlipidemia, unspecified: Secondary | ICD-10-CM | POA: Diagnosis not present

## 2022-12-22 DIAGNOSIS — I2510 Atherosclerotic heart disease of native coronary artery without angina pectoris: Secondary | ICD-10-CM | POA: Diagnosis not present

## 2022-12-22 DIAGNOSIS — Z9181 History of falling: Secondary | ICD-10-CM | POA: Diagnosis not present

## 2022-12-22 DIAGNOSIS — Z7982 Long term (current) use of aspirin: Secondary | ICD-10-CM | POA: Diagnosis not present

## 2022-12-22 DIAGNOSIS — E669 Obesity, unspecified: Secondary | ICD-10-CM | POA: Diagnosis not present

## 2022-12-22 DIAGNOSIS — Z880 Allergy status to penicillin: Secondary | ICD-10-CM | POA: Diagnosis not present

## 2022-12-22 DIAGNOSIS — Z79899 Other long term (current) drug therapy: Secondary | ICD-10-CM | POA: Diagnosis not present

## 2022-12-22 DIAGNOSIS — K5900 Constipation, unspecified: Secondary | ICD-10-CM | POA: Diagnosis not present

## 2022-12-22 DIAGNOSIS — Z791 Long term (current) use of non-steroidal anti-inflammatories (NSAID): Secondary | ICD-10-CM | POA: Diagnosis not present

## 2022-12-22 DIAGNOSIS — F322 Major depressive disorder, single episode, severe without psychotic features: Secondary | ICD-10-CM | POA: Diagnosis not present

## 2022-12-22 NOTE — Telephone Encounter (Signed)
Gunnar Fusi; Armitiza, linzess and Trulance have all been denied. Would you like to order anything else?

## 2022-12-24 ENCOUNTER — Other Ambulatory Visit: Payer: Self-pay | Admitting: Family Medicine

## 2022-12-24 ENCOUNTER — Other Ambulatory Visit (HOSPITAL_COMMUNITY): Payer: Self-pay

## 2022-12-24 DIAGNOSIS — E782 Mixed hyperlipidemia: Secondary | ICD-10-CM

## 2022-12-24 DIAGNOSIS — I700 Atherosclerosis of aorta: Secondary | ICD-10-CM

## 2022-12-24 NOTE — Telephone Encounter (Signed)
Called patient to notify of the co-pay at [facility] being 122.95 and they will have to order the medication or she can have the [facility] to process with a co-pay of 40.00. Patient is not available, left message to call back.

## 2022-12-24 NOTE — Telephone Encounter (Signed)
Pharmacy Patient Advocate Encounter  Received notification from [facility] that Prior Authorization for LUBIPROSTONE has been APPROVED from 12/16/22 to 12/15/2025. Spoke to pharmacy to process.Copay is $122.95.  [facility] said they have to order it. We processed it in our pharmacy and it was 40.00. She should be able to go online to the [place] website to get a copay card, if not we would be happy to fill it for her.    PA #/Case ID/Reference #: [id number]

## 2022-12-24 NOTE — Telephone Encounter (Signed)
Called and left message for patient per Ms. Willette Cluster, NP. Informed the patient that she can call her insurance company to find out what medications will be covered so that medication can be ordered. Left call back phone number at [phone].

## 2022-12-24 NOTE — Telephone Encounter (Signed)
Inbound call from patient returning phone call. Requesting a call back. Please advise, thank you.  

## 2022-12-25 ENCOUNTER — Other Ambulatory Visit (HOSPITAL_BASED_OUTPATIENT_CLINIC_OR_DEPARTMENT_OTHER): Payer: Self-pay

## 2022-12-25 ENCOUNTER — Other Ambulatory Visit: Payer: Self-pay | Admitting: *Deleted

## 2022-12-25 ENCOUNTER — Other Ambulatory Visit: Payer: Self-pay | Admitting: Family Medicine

## 2022-12-25 DIAGNOSIS — M62831 Muscle spasm of calf: Secondary | ICD-10-CM

## 2022-12-25 DIAGNOSIS — K5909 Other constipation: Secondary | ICD-10-CM

## 2022-12-25 MED ORDER — LUBIPROSTONE 24 MCG PO CAPS
24.0000 ug | ORAL_CAPSULE | Freq: Two times a day (BID) | ORAL | 6 refills | Status: AC
  Filled 2022-12-25: qty 30, 15d supply, fill #0

## 2022-12-25 NOTE — Telephone Encounter (Signed)
Called patient to inform of the approval for the Amitiza medication. Patient will be using [facility] at [facility]. Location and phone number given to the patient. Patient is pleased with the approval.

## 2022-12-27 ENCOUNTER — Other Ambulatory Visit: Payer: Self-pay | Admitting: Nurse Practitioner

## 2022-12-27 ENCOUNTER — Other Ambulatory Visit: Payer: Self-pay | Admitting: Family Medicine

## 2022-12-27 DIAGNOSIS — G4701 Insomnia due to medical condition: Secondary | ICD-10-CM

## 2022-12-27 DIAGNOSIS — J4540 Moderate persistent asthma, uncomplicated: Secondary | ICD-10-CM

## 2022-12-27 DIAGNOSIS — J3089 Other allergic rhinitis: Secondary | ICD-10-CM

## 2022-12-28 ENCOUNTER — Telehealth: Payer: Self-pay

## 2022-12-28 MED ORDER — CYCLOBENZAPRINE HCL 10 MG PO TABS: 10.0000 mg | ORAL_TABLET | Freq: Three times a day (TID) | ORAL | 3 refills | Status: DC | PRN

## 2022-12-28 NOTE — Telephone Encounter (Signed)
Insurance doesn't cover multivitamin. Needs to request norco from ortho.

## 2022-12-28 NOTE — Telephone Encounter (Signed)
Pharmacy Patient Advocate Encounter   Received notification from CoverMyMeds that prior authorization for Trulance 3 mg tablets is required/requested.   Insurance verification completed.   The patient is insured through [employer] [facility] .   Per test claim: PA required; PA started via CoverMyMeds. KEY [id number] . Waiting for clinical questions to populate.

## 2023-01-04 ENCOUNTER — Other Ambulatory Visit (HOSPITAL_BASED_OUTPATIENT_CLINIC_OR_DEPARTMENT_OTHER): Payer: Self-pay

## 2023-01-06 ENCOUNTER — Other Ambulatory Visit: Payer: Self-pay | Admitting: Family Medicine

## 2023-01-06 ENCOUNTER — Telehealth: Payer: Self-pay | Admitting: Family Medicine

## 2023-01-06 DIAGNOSIS — J4530 Mild persistent asthma, uncomplicated: Secondary | ICD-10-CM

## 2023-01-06 DIAGNOSIS — Z0279 Encounter for issue of other medical certificate: Secondary | ICD-10-CM

## 2023-01-06 NOTE — Telephone Encounter (Signed)
  Name from pharmacy: [facility]        [facility]   Pharmacy comment: NDC not covered by insurance.  DAW 1.

## 2023-01-06 NOTE — Telephone Encounter (Signed)
Pt came in to the office to drop off and pay for Disability form to be filled out. Please call pt when her copy is ready for pick up. Will put form in Cathys box.

## 2023-01-07 NOTE — Telephone Encounter (Signed)
Copied from CRM [id number]. Topic: General - Other >> Jan 07, 2023  8:34 AM Geroge Baseman wrote: Reason for CRM: Patient states she would like a call from Olegario Messier so she can speak with her about her paperwork.

## 2023-01-07 NOTE — Telephone Encounter (Signed)
Talked to pt about paperwork that needed to be filled out. She will be by to sign the form, it was just needing providers she has seen.

## 2023-01-11 ENCOUNTER — Telehealth: Payer: Self-pay | Admitting: Student

## 2023-01-11 ENCOUNTER — Telehealth: Payer: Self-pay | Admitting: Nurse Practitioner

## 2023-01-11 NOTE — Telephone Encounter (Signed)
PT states she is waiting on disability ppwk. Says her job is needing it asap. Sent note to Darilyn to call her @ [phone]

## 2023-01-11 NOTE — Telephone Encounter (Signed)
[facility] has not received any disability paperwork for this patient.  I called her cell phone and spoke to a family member.  I let him know that we did not get any forms from insurance but that a note in her chart states her PCP, Harlow Mares, NP completed a form and faxed it on 01/07/23.

## 2023-01-15 ENCOUNTER — Telehealth: Payer: Self-pay

## 2023-01-15 ENCOUNTER — Ambulatory Visit (AMBULATORY_SURGERY_CENTER): Payer: Self-pay

## 2023-01-15 ENCOUNTER — Telehealth: Payer: Self-pay | Admitting: Family Medicine

## 2023-01-15 VITALS — Ht 64.0 in | Wt 210.0 lb

## 2023-01-15 DIAGNOSIS — K219 Gastro-esophageal reflux disease without esophagitis: Secondary | ICD-10-CM

## 2023-01-15 NOTE — Telephone Encounter (Signed)
Completed pre visit

## 2023-01-15 NOTE — Progress Notes (Signed)
No egg or soy allergy known to patient  No issues known to pt with past sedation with any surgeries or procedures Patient denies ever being told they had issues or difficulty with intubation  No FH of Malignant Hyperthermia Pt is not on diet pills Pt is not on  home 02  Pt is not on blood thinners  Pt denies issues with constipation  No A fib or A flutter Have any cardiac testing pending--no Pt can ambulate independently with walker Pt denies use of chewing tobacco Discussed diabetic I weight loss medication holds Discussed NSAID holds Checked BMI Pt instructed to use Singlecare.com or GoodRx for a price reduction on prep  Patient's chart reviewed by Cathlyn Parsons CNRA prior to previsit and patient appropriate for the [facility].  Pre visit completed and red dot placed by patient's name on their procedure day (on provider's schedule).

## 2023-01-15 NOTE — Telephone Encounter (Signed)
I think this is just for medical records

## 2023-01-15 NOTE — Telephone Encounter (Signed)
Faxed to email listed below.

## 2023-01-15 NOTE — Telephone Encounter (Unsigned)
Copied from CRM [id number]. Topic: Medical Record Request - Other >> Jan 15, 2023 11:02 AM Geroge Baseman wrote: Reason for CRM: Patient called to state that Victorino Dike at [employer] did not receive the patients provider verification that the patient signed in office. Patient would like it to be resent over to  [place] and she provided an email for [email or web address]

## 2023-01-18 NOTE — Telephone Encounter (Signed)
NFN 

## 2023-01-21 ENCOUNTER — Other Ambulatory Visit (HOSPITAL_COMMUNITY): Payer: Self-pay

## 2023-01-22 ENCOUNTER — Encounter: Payer: Self-pay | Admitting: Family Medicine

## 2023-01-22 ENCOUNTER — Ambulatory Visit (INDEPENDENT_AMBULATORY_CARE_PROVIDER_SITE_OTHER): Payer: No Typology Code available for payment source | Admitting: Family Medicine

## 2023-01-22 VITALS — BP 171/97 | HR 98 | Temp 98.1°F | Ht 64.0 in | Wt 195.2 lb

## 2023-01-22 DIAGNOSIS — I10 Essential (primary) hypertension: Secondary | ICD-10-CM | POA: Diagnosis not present

## 2023-01-22 DIAGNOSIS — K219 Gastro-esophageal reflux disease without esophagitis: Secondary | ICD-10-CM

## 2023-01-22 DIAGNOSIS — F339 Major depressive disorder, recurrent, unspecified: Secondary | ICD-10-CM | POA: Insufficient documentation

## 2023-01-22 DIAGNOSIS — M5416 Radiculopathy, lumbar region: Secondary | ICD-10-CM | POA: Diagnosis not present

## 2023-01-22 DIAGNOSIS — R531 Weakness: Secondary | ICD-10-CM | POA: Diagnosis not present

## 2023-01-22 DIAGNOSIS — J441 Chronic obstructive pulmonary disease with (acute) exacerbation: Secondary | ICD-10-CM

## 2023-01-22 DIAGNOSIS — F419 Anxiety disorder, unspecified: Secondary | ICD-10-CM

## 2023-01-22 DIAGNOSIS — M5126 Other intervertebral disc displacement, lumbar region: Secondary | ICD-10-CM | POA: Diagnosis not present

## 2023-01-22 MED ORDER — PREDNISONE 20 MG PO TABS: 40.0000 mg | ORAL_TABLET | Freq: Every day | ORAL | 0 refills | Status: AC

## 2023-01-22 MED ORDER — OLMESARTAN MEDOXOMIL 40 MG PO TABS: 40.0000 mg | ORAL_TABLET | Freq: Every day | ORAL | 3 refills | Status: AC

## 2023-01-22 MED ORDER — AZITHROMYCIN 250 MG PO TABS: ORAL_TABLET | ORAL | 0 refills | Status: DC

## 2023-01-22 MED ORDER — HYDROCHLOROTHIAZIDE 25 MG PO TABS: 25.0000 mg | ORAL_TABLET | Freq: Every day | ORAL | 3 refills | Status: AC

## 2023-01-22 MED ORDER — BUSPIRONE HCL 10 MG PO TABS: 10.0000 mg | ORAL_TABLET | Freq: Two times a day (BID) | ORAL | 3 refills | Status: DC

## 2023-01-22 NOTE — Progress Notes (Signed)
 Established Patient Office Visit  Subjective   Patient ID: Haley Nielsen, female    DOB: 08-Aug-1965  Age: 58 y.o. MRN: [id number]  Chief Complaint  Patient presents with   Back Pain    HPI Haley Nielsen is her for a follow up of her back pain.    Back Pain She continues to have significant pain. Pain is 10/10. Still requring assistance with ADLs, inability to stand for more than a few minutes at a time. Requiring more assistance since her last visit. Also has significant discomfort with sitting and is not able to walk further than very short distances. She had had nerve conduction testing was consistent with lumbar radiculopathy. She has an appt with surgeon on 01/24/22 to discuss next steps- likely surgery. She has failed 2 back injections.  Taking gapapentin TID, cymbalta , norco, and muscle relaxers.   2. HTN Complaint with meds - Yes Current Medications - amlodipine  10 mg, olmesartan -hydrochlorothiazide  40-12.5 Checking BP at home ranging 160s/100s Pertinent ROS:  Visual Disturbances - No Chest pain - No Dyspnea - sometimes with activity Palpitations - No LE edema - No  Established with cardiology.   BP Readings from Last 3 Encounters:  01/22/23 (!) 171/97  12/02/22 123/75  11/12/22 (!) 151/90    3. Cough Productive cough with yellow sputum for the last 3 week. Wheezing intermittently. Shortness of breath with activity. She has been using albuterol  multiple times day. Complaint with trelegy once a day.  Established with pulmonology now. Has follow up next week.   Depression/anxiety Compliant with celexa  20 mg, buspar  5 mg BID. Increased symptoms since back pain started. She would like to adjust medication.      01/22/2023   10:54 AM 11/12/2022    2:47 PM 09/30/2022    2:32 PM  Depression screen PHQ 2/9  Decreased Interest 2 1 2   Down, Depressed, Hopeless 2 1 1   PHQ - 2 Score 4 2 3   Altered sleeping 2 1 1   Tired, decreased energy 3 1 2   Change in appetite 3 1 1   Feeling  bad or failure about yourself  2 1 1   Trouble concentrating 3 1 1   Moving slowly or fidgety/restless 2 1 1   Suicidal thoughts 0 1 0  PHQ-9 Score 19 9 10   Difficult doing work/chores Somewhat difficult Not difficult at all Not difficult at all      01/22/2023   10:55 AM 11/12/2022    2:48 PM 09/30/2022    2:31 PM 09/02/2022    1:33 PM  GAD 7 : Generalized Anxiety Score  Nervous, Anxious, on Edge 2 1 1 1   Control/stop worrying 2 1 1 2   Worry too much - different things 2 1 1 2   Trouble relaxing 3 2 2 3   Restless 2 2 1 2   Easily annoyed or irritable 2 1 0 2  Afraid - awful might happen 2 1 1 1   Total GAD 7 Score 15 9 7 13   Anxiety Difficulty Somewhat difficult Not difficult at all Not difficult at all Somewhat difficult       ROS As per HPI.    Objective:     BP (!) 171/97   Pulse 98   Temp 98.1 F (36.7 C) (Temporal)   Ht 5' 4 (1.626 m)   Wt 195 lb 3.2 oz (88.5 kg)   SpO2 99%   BMI 33.51 kg/m  BP Readings from Last 3 Encounters:  01/22/23 (!) 171/97  12/02/22 123/75  11/12/22 (!) 151/90  Physical Exam Vitals and nursing note reviewed.  Constitutional:      General: She is not in acute distress.    Appearance: She is obese. She is not ill-appearing, toxic-appearing or diaphoretic.  HENT:     Right Ear: Tympanic membrane, ear canal and external ear normal.     Left Ear: Tympanic membrane, ear canal and external ear normal.     Nose: Congestion present.     Mouth/Throat:     Mouth: Mucous membranes are moist.     Pharynx: Posterior oropharyngeal erythema present. No pharyngeal swelling, oropharyngeal exudate, uvula swelling or postnasal drip.     Tonsils: No tonsillar exudate. 1+ on the right. 1+ on the left.  Eyes:     General:        Right eye: No discharge.        Left eye: No discharge.     Extraocular Movements: Extraocular movements intact.     Pupils: Pupils are equal, round, and reactive to light.  Neck:     Vascular: No carotid bruit.   Cardiovascular:     Rate and Rhythm: Regular rhythm.     Heart sounds: Normal heart sounds. No murmur heard. Pulmonary:     Effort: Pulmonary effort is normal. No respiratory distress.     Breath sounds: Normal breath sounds. No stridor. No wheezing or rales.  Abdominal:     General: Bowel sounds are normal. There is no distension.     Palpations: Abdomen is soft.     Tenderness: There is no abdominal tenderness.  Musculoskeletal:        General: No swelling.     Cervical back: Normal range of motion and neck supple. No rigidity or tenderness.     Lumbar back: Tenderness and bony tenderness present. No swelling or edema. Positive left straight leg raise test.     Right lower leg: No edema.     Left lower leg: No edema.  Skin:    General: Skin is warm and dry.  Neurological:     General: No focal deficit present.     Mental Status: She is alert and oriented to person, place, and time.     Cranial Nerves: No facial asymmetry.     Sensory: Sensation is intact.     Motor: Weakness (4/5 LUE and LLE) present.     Gait: Gait abnormal (antalgic gait).  Psychiatric:        Mood and Affect: Mood normal.        Behavior: Behavior normal.      No results found for any visits on 01/22/23.    The 10-year ASCVD risk score (Arnett DK, et al., 2019) is: 21.7%    Assessment & Plan:   Haley Nielsen was seen today for back pain.  Diagnoses and all orders for this visit:  Lumbar radiculopathy Protrusion of lumbar intervertebral disc Left-sided weakness -- CVA ruled out Uncontrolled pain. Requires assistance with ADLs due to pain. Will need to remain out of work. Has appt with surgeon on 01/24/22 to discussed next steps which will likely include surgery. Return to work date is TBD.   Primary hypertension Uncontrolled. Increase hydrochlorothiazide  to 25 mg daily. Continue amlodipine  10 mg and olmesartan  40 mg. Will recheck in 2 week and plan to increase hydrochlorothiazide  to 50 mg daily if BP  not at goal on follow up.  -     olmesartan  (BENICAR ) 40 MG tablet; Take 1 tablet (40 mg total) by mouth daily. -  hydrochlorothiazide  (HYDRODIURIL ) 25 MG tablet; Take 1 tablet (25 mg total) by mouth daily.  Depression, recurrent (HCC) Anxiety Uncontrolled. Denies SI. Continue celexa . Increase buspar  to 10 mg BID. Will recheck in 6 weeks.  -     busPIRone  (BUSPAR ) 10 MG tablet; Take 1 tablet (10 mg total) by mouth 2 (two) times daily.  Gastroesophageal reflux disease, unspecified whether esophagitis present Has EGD upcoming with GI.   Acute exacerbation of COPD with asthma (HCC) Lungs clear on exam today. Prendisone burst as below. Will cover empirically with zpak. Continue trelegy, albuterol  prn. Return to office for new or worsening symptoms, or if symptoms persist.  -     azithromycin  (ZITHROMAX  Z-PAK) 250 MG tablet; As directed -     predniSONE  (DELTASONE ) 20 MG tablet; Take 2 tablets (40 mg total) by mouth daily with breakfast for 5 days.  Return in about 2 weeks (around 02/05/2023) for nurse visit for BP. 6 week chronic with me.   The patient indicates understanding of these issues and agrees with the plan.  Annabella CHRISTELLA Search, FNP

## 2023-01-27 ENCOUNTER — Encounter: Payer: Self-pay | Admitting: Gastroenterology

## 2023-01-27 ENCOUNTER — Encounter: Payer: Self-pay | Admitting: Certified Registered Nurse Anesthetist

## 2023-01-27 ENCOUNTER — Other Ambulatory Visit: Payer: Self-pay | Admitting: Family Medicine

## 2023-01-27 DIAGNOSIS — J4530 Mild persistent asthma, uncomplicated: Secondary | ICD-10-CM

## 2023-01-28 ENCOUNTER — Ambulatory Visit: Payer: BC Managed Care – PPO | Admitting: Nurse Practitioner

## 2023-01-28 ENCOUNTER — Encounter: Payer: BC Managed Care – PPO | Admitting: Gastroenterology

## 2023-01-28 ENCOUNTER — Telehealth: Payer: Self-pay | Admitting: Gastroenterology

## 2023-01-28 NOTE — Telephone Encounter (Signed)
 Good Morning Dr. Chales Abrahams,   Patient called stating that she will not be able to make her appointment with you this morning at 10:00 due to not feeling well.  Patient was rescheduled for 3/11 at 10:00

## 2023-02-09 ENCOUNTER — Ambulatory Visit (INDEPENDENT_AMBULATORY_CARE_PROVIDER_SITE_OTHER): Payer: No Typology Code available for payment source | Admitting: *Deleted

## 2023-02-09 VITALS — BP 127/73 | HR 89

## 2023-02-09 DIAGNOSIS — I10 Essential (primary) hypertension: Secondary | ICD-10-CM

## 2023-02-09 NOTE — Progress Notes (Signed)
Patient in today for BP check. 127/73. Heart rate 89.

## 2023-03-05 ENCOUNTER — Ambulatory Visit: Payer: No Typology Code available for payment source | Admitting: Family Medicine

## 2023-03-08 ENCOUNTER — Encounter: Payer: Self-pay | Admitting: Family Medicine

## 2023-03-08 ENCOUNTER — Ambulatory Visit: Payer: No Typology Code available for payment source | Admitting: Family Medicine

## 2023-03-08 VITALS — BP 137/87 | HR 99 | Temp 98.1°F | Ht 64.0 in | Wt 201.4 lb

## 2023-03-08 DIAGNOSIS — E782 Mixed hyperlipidemia: Secondary | ICD-10-CM

## 2023-03-08 DIAGNOSIS — J4489 Other specified chronic obstructive pulmonary disease: Secondary | ICD-10-CM | POA: Diagnosis not present

## 2023-03-08 DIAGNOSIS — K219 Gastro-esophageal reflux disease without esophagitis: Secondary | ICD-10-CM | POA: Diagnosis not present

## 2023-03-08 DIAGNOSIS — I700 Atherosclerosis of aorta: Secondary | ICD-10-CM

## 2023-03-08 DIAGNOSIS — F419 Anxiety disorder, unspecified: Secondary | ICD-10-CM

## 2023-03-08 DIAGNOSIS — F339 Major depressive disorder, recurrent, unspecified: Secondary | ICD-10-CM

## 2023-03-08 DIAGNOSIS — R7303 Prediabetes: Secondary | ICD-10-CM

## 2023-03-08 DIAGNOSIS — I10 Essential (primary) hypertension: Secondary | ICD-10-CM | POA: Diagnosis not present

## 2023-03-08 DIAGNOSIS — E66811 Obesity, class 1: Secondary | ICD-10-CM

## 2023-03-08 DIAGNOSIS — M5126 Other intervertebral disc displacement, lumbar region: Secondary | ICD-10-CM

## 2023-03-08 DIAGNOSIS — R531 Weakness: Secondary | ICD-10-CM

## 2023-03-08 DIAGNOSIS — Z6834 Body mass index (BMI) 34.0-34.9, adult: Secondary | ICD-10-CM

## 2023-03-08 DIAGNOSIS — E6609 Other obesity due to excess calories: Secondary | ICD-10-CM

## 2023-03-08 LAB — BAYER DCA HB A1C WAIVED: HB A1C (BAYER DCA - WAIVED): 5.6 % (ref 4.8–5.6)

## 2023-03-08 MED ORDER — ALBUTEROL SULFATE HFA 108 (90 BASE) MCG/ACT IN AERS: 2.0000 | INHALATION_SPRAY | Freq: Four times a day (QID) | RESPIRATORY_TRACT | 1 refills | Status: DC | PRN

## 2023-03-08 MED ORDER — AZITHROMYCIN 250 MG PO TABS: ORAL_TABLET | ORAL | 0 refills | Status: DC

## 2023-03-08 MED ORDER — PANTOPRAZOLE SODIUM 40 MG PO TBEC: 40.0000 mg | DELAYED_RELEASE_TABLET | Freq: Every day | ORAL | 3 refills | Status: DC

## 2023-03-08 MED ORDER — PREDNISONE 20 MG PO TABS: 40.0000 mg | ORAL_TABLET | Freq: Every day | ORAL | 0 refills | Status: AC

## 2023-03-08 NOTE — Progress Notes (Unsigned)
Established Patient Office Visit  Subjective   Patient ID: Haley Nielsen, female    DOB: [birth year]  Age: 58 y.o. MRN: [id number]  Chief Complaint  Patient presents with   Medical Management of Chronic Issues    HPI  HTN Complaint with meds - Yes Current Medications - amlodipine 10 mg, olmesartan 40, HCTZ 25 Pertinent ROS:  Headache - Yes Fatigue - No Chest pain - No Dyspnea - stable Palpitations - No LE edema - No  2. HLD On statin and zetia. Denies side effects.   3. Prediabetes Regular diet, no exercise.   4. Asthma/COPD Cough and congestion for 2 weeks. Getting worse. Yellow sputum. Hears rattling. Increased wheezing, more short of breath. No fever, chest pain. Having to use albuterol inhaler multiple times a day. Sinus pressure, worsening.   5. GERD Compliant with medications - Yes Current medications - omperazole 20, pepcid BID Cough - Yes Sore throat - No Voice change - No Hemoptysis - No Dysphagia or dyspepsia - Yes, some dysphagia. Stable.  Water brash - Yes Red Flags (weight loss, hematochezia, melena, weight loss, early satiety, fevers, odynophagia, or persistent vomiting) - No   6. Back Pain She continues to have significant pain. Still requring assistance with ADLs, inability to stand for more than a few minutes. Had to reschedule her latest follow up with neurosurgery due to finances. She has this rescheduled for 03/17/23. Has had 2 injections without relief.   7. Anxiety/depression Stable overall. Related to chronic pain. Denies SI.      03/08/2023    8:59 AM 01/22/2023   10:54 AM 11/12/2022    2:47 PM  Depression screen PHQ 2/9  Decreased Interest 2 2 1   Down, Depressed, Hopeless 2 2 1   PHQ - 2 Score 4 4 2   Altered sleeping 2 2 1   Tired, decreased energy 2 3 1   Change in appetite 1 3 1   Feeling bad or failure about yourself  1 2 1   Trouble concentrating 1 3 1   Moving slowly or fidgety/restless 1 2 1   Suicidal thoughts 0 0 1  PHQ-9 Score  12 19 9   Difficult doing work/chores Not difficult at all Somewhat difficult Not difficult at all      03/08/2023    9:00 AM 01/22/2023   10:55 AM 11/12/2022    2:48 PM 09/30/2022    2:31 PM  GAD 7 : Generalized Anxiety Score  Nervous, Anxious, on Edge 2 2 1 1   Control/stop worrying 2 2 1 1   Worry too much - different things 2 2 1 1   Trouble relaxing 3 3 2 2   Restless 2 2 2 1   Easily annoyed or irritable 1 2 1  0  Afraid - awful might happen 2 2 1 1   Total GAD 7 Score 14 15 9 7   Anxiety Difficulty Not difficult at all Somewhat difficult Not difficult at all Not difficult at all      Past Medical History:  Diagnosis Date   Allergic rhinitis    Allergy    Anemia    Asthma    Blurry vision    Bronchospasm    Chest pain    Dizziness    Elevated CK-MB level    GERD (gastroesophageal reflux disease)    Heart murmur    Hyperlipidemia    Hypertension    Neuropathy 05/15/2021   Syncope    Syncope    Tobacco abuse    Weakness       ROS As  per HPI.    Objective:     BP 137/87   Pulse 99   Temp 98.1 F (36.7 C) (Temporal)   Ht 5\' 4"  (1.626 m)   Wt 201 lb 6.4 oz (91.4 kg)   SpO2 97%   BMI 34.57 kg/m  BP Readings from Last 3 Encounters:  03/08/23 137/87  02/09/23 127/73  01/22/23 (!) 171/97   Wt Readings from Last 3 Encounters:  03/08/23 201 lb 6.4 oz (91.4 kg)  01/22/23 195 lb 3.2 oz (88.5 kg)  01/15/23 210 lb (95.3 kg)     Physical Exam Vitals and nursing note reviewed.  Constitutional:      General: She is not in acute distress.    Appearance: She is not ill-appearing, toxic-appearing or diaphoretic.  HENT:     Right Ear: Tympanic membrane, ear canal and external ear normal.     Left Ear: Tympanic membrane, ear canal and external ear normal.     Nose: Congestion present.     Mouth/Throat:     Mouth: Mucous membranes are moist.     Pharynx: Oropharynx is clear.  Eyes:     General:        Right eye: No discharge.        Left eye: No discharge.      Extraocular Movements: Extraocular movements intact.     Pupils: Pupils are equal, round, and reactive to light.  Neck:     Vascular: No carotid bruit.  Cardiovascular:     Rate and Rhythm: Normal rate and regular rhythm.     Heart sounds: Normal heart sounds. No murmur heard. Pulmonary:     Effort: Pulmonary effort is normal. No respiratory distress.     Breath sounds: Normal breath sounds. No wheezing or rhonchi.  Abdominal:     General: Bowel sounds are normal. There is no distension.     Palpations: Abdomen is soft.     Tenderness: There is no abdominal tenderness.  Musculoskeletal:        General: No swelling.     Cervical back: Normal range of motion and neck supple.     Right lower leg: No edema.     Left lower leg: No edema.  Skin:    General: Skin is warm and dry.  Neurological:     Mental Status: She is alert and oriented to person, place, and time.     Cranial Nerves: No facial asymmetry.     Sensory: Sensation is intact.     Gait: Gait abnormal (antalgic gait).  Psychiatric:        Mood and Affect: Mood normal.        Behavior: Behavior normal.      No results found for any visits on 03/08/23.    The 10-year ASCVD risk score (Arnett DK, et al., 2019) is: 11.6%    Assessment & Plan:   Tereka was seen today for medical management of chronic issues.  Diagnoses and all orders for this visit:  Primary hypertension Well controlled on current regimen.   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 Prednisone burst and zpak as below. Continue albuterol prn.  -     albuterol (VENTOLIN HFA) 108 (90 Base) MCG/ACT inhaler; Inhale 2 puffs into the lungs every 6 (six) hours as needed for wheezing or shortness of breath. -     predniSONE (DELTASONE) 20 MG tablet; Take 2 tablets (40 mg total) by mouth daily with breakfast for 5 days. -  azithromycin (ZITHROMAX Z-PAK) 250 MG tablet; As directed  Aortic atherosclerosis (HCC) On statin.   Gastroesophageal  reflux disease, unspecified whether esophagitis present Refill provided. Follow up with GI.  -     pantoprazole (PROTONIX) 40 MG tablet; Take 1 tablet (40 mg total) by mouth daily.  Prediabetes -     Bayer DCA Hb A1c Waived  Mixed hyperlipidemia On statin and zetia.   Depression, recurrent (HCC) Anxiety Stable but not well controlled. Continue current regimen. Related to pain.   Protrusion of lumbar intervertebral disc Left-sided weakness -- CVA ruled out Continue follow up with ortho, neurosurgery.   Class 1 obesity due to excess calories with serious comorbidity and body mass index (BMI) of 34.0 to 34.9 in adult Trending up from last weight. Well balanced diet. Unable to exercise due to back pain currently.     Return in about 3 months (around 06/05/2023) for chronic follow up.   The patient indicates understanding of these issues and agrees with the plan.  Gabriel Earing, FNP

## 2023-03-30 ENCOUNTER — Encounter: Payer: Self-pay | Admitting: Gastroenterology

## 2023-03-30 ENCOUNTER — Ambulatory Visit: Payer: Self-pay | Admitting: Gastroenterology

## 2023-03-30 VITALS — BP 113/78 | HR 89 | Temp 97.9°F | Resp 18 | Ht 64.0 in | Wt 210.0 lb

## 2023-03-30 DIAGNOSIS — K222 Esophageal obstruction: Secondary | ICD-10-CM

## 2023-03-30 DIAGNOSIS — K219 Gastro-esophageal reflux disease without esophagitis: Secondary | ICD-10-CM | POA: Diagnosis not present

## 2023-03-30 DIAGNOSIS — R1310 Dysphagia, unspecified: Secondary | ICD-10-CM

## 2023-03-30 DIAGNOSIS — K2970 Gastritis, unspecified, without bleeding: Secondary | ICD-10-CM | POA: Diagnosis not present

## 2023-03-30 DIAGNOSIS — K3189 Other diseases of stomach and duodenum: Secondary | ICD-10-CM | POA: Diagnosis not present

## 2023-03-30 DIAGNOSIS — K259 Gastric ulcer, unspecified as acute or chronic, without hemorrhage or perforation: Secondary | ICD-10-CM

## 2023-03-30 MED ORDER — PANTOPRAZOLE SODIUM 40 MG PO TBEC
40.0000 mg | DELAYED_RELEASE_TABLET | Freq: Every day | ORAL | 4 refills | Status: DC
Start: 2023-03-30 — End: 2023-03-31

## 2023-03-30 NOTE — Progress Notes (Signed)
 ASSESSMENT & PLAN    58 y.o.  female known to Dr. Chales Abrahams.  She was last seen (by me) 07/14/2022 for evaluation of constipation, GERD, abdominal pain versus musculoskeletal pain.  Here for follow-up   Chronic constipation, likely medication related.  She requires several doses of opioids daily for chronic back pain.  On other constipating medications as well. No improvement after trial of fiber, stool softeners and MiraLAX.  She drinks several bottles of water a day.  Stools are very hard and difficult to evacuate.   -- Trial of Linzess 145 mcg daily on empty stomach .  -- Continue with good hydration  -- Consider pelvic floor PT, will hold off for now.  On DRE she seems to have good perineal descent when bearing down. Right now will focus on softening stool for easier evacuation.    Chronic GERD on PPI + H2 blocker having breakthrough symptoms and intermittent solid food dysphagia. No prior upper endoscopic evaluation.  --Schedule for EGD. The risks and benefits of EGD with possible biopsies were discussed with the patient who agrees to proceed.  --Swallowing precautions discussed. Advised to eat slowly, chew food well before swallowing. Drink  liquids in between each bite to avoid food impaction. --Continue anti-reflux measures such as avoidance of late meals / bedtime snacks, HOB elevation (or use of wedge pillow), weight reduction ( if applicable)  / maintaining a healthy BMI ( body mass index),  and avoidance of trigger foods and caffeine.  -- For now continue PPI in a.m. and famotidine at bedtime   History of adenomatous polyps. She is up-to-date on colonoscopy, last one November 2023 with removal of a small tubular adenoma   History of asthma, hypertension, anxiety, uterine fibroids.   See PMH below for additional history   HPI    Chief complaint : ongoing constipation, persistent reflux, swallowing problems.    LLQ pain / constipation Chatara did try the Bentyl for LLQ pain and  it helped.  She still feels like the pain is originating from her back.  Unfortunately the constipation has not improved despite 2 week trial of Metamucil followed by a 2-week trial of FiberCon.  Also taking 2 stool softeners a day and daily MiraLAX.  She did not try the glycerin suppositories.  She gets what sounds like normal urges for bowel movements but her stools are so hard she can barely pass them.  She is on a lot of constipating medications especially hydrocodone which she has to take several times a day.    GERD She was having breakthrough pyrosis on daily omeprazole when I saw her in June.  We discussed antireflux measures and added 20 mg of famotidine at bedtime.  The addition of famotidine has not made much of a difference, still having breakthrough pyrosis.  Also complains of feeling like food is hanging up in her esophagus     Previous GI Endoscopies / Labs / Imaging          Latest Ref Rng & Units 05/18/2022    3:03 PM 04/02/2022    8:41 AM 08/04/2021    4:29 PM  CBC  WBC 4.0 - 10.5 K/uL 12.1  8.1  9.7   Hemoglobin 12.0 - 15.0 g/dL 88.4  16.6  06.3   Hematocrit 36.0 - 46.0 % 41.8  42.3  37.2   Platelets 150 - 400 K/uL 369  378  335  Past Medical History:  Diagnosis Date   Allergic rhinitis     Allergy     Blurry vision     Bronchospasm     Chest pain     Dizziness     Elevated CK-MB level     GERD (gastroesophageal reflux disease)     Hyperlipidemia     Hypertension     Neuropathy 05/15/2021   Syncope     Syncope     Tobacco abuse     Weakness                 Past Surgical History:  Procedure Laterality Date   TUBAL LIGATION                   Family History  Problem Relation Age of Onset   Colon polyps Mother     Hyperlipidemia Mother     Hypertension Mother     Stroke Father 34   Heart attack Maternal Grandmother     Cancer Paternal Grandmother          cervical cancer   Colon cancer Neg Hx     Esophageal cancer Neg Hx     Rectal  cancer Neg Hx     Stomach cancer Neg Hx     Breast cancer Neg Hx            Current Medications, Allergies, Family History and Social History were reviewed in [employer] record.           Current Outpatient Medications  Medication Sig Dispense Refill   amLODipine (NORVASC) 10 MG tablet Take 1 tablet (10 mg total) by mouth daily. 90 tablet 3   aspirin EC (EQ ASPIRIN ADULT LOW DOSE) 81 MG tablet TAKE 1 TABLET BY MOUTH ONCE DAILY. SWALLOW WHOLE 90 tablet 1   atorvastatin (LIPITOR) 80 MG tablet Take 1 tablet (80 mg total) by mouth every evening. Keep on file 90 tablet 3   busPIRone (BUSPAR) 5 MG tablet Take 1 tablet (5 mg total) by mouth 2 (two) times daily. 180 tablet 1   DULoxetine (CYMBALTA) 60 MG capsule Take 1 capsule (60 mg total) by mouth daily. 90 capsule 3   ezetimibe (ZETIA) 10 MG tablet Take 1 tablet (10 mg total) by mouth daily. 90 tablet 3   famotidine (PEPCID) 20 MG tablet Take 1 tablet (20 mg total) by mouth at bedtime. 90 tablet 3   fluticasone (FLONASE) 50 MCG/ACT nasal spray Place 2 sprays into both nostrils daily. 16 g 6   Fluticasone-Umeclidin-Vilant (TRELEGY ELLIPTA) 100-62.5-25 MCG/ACT AEPB Inhale 1 puff into the lungs daily. 1 each 11   gabapentin (NEURONTIN) 600 MG tablet Take 1 tablet (600 mg total) by mouth 3 (three) times daily. 270 tablet 1   HYDROcodone-acetaminophen (NORCO) 10-325 MG tablet Take 1 tablet by mouth every 8 (eight) hours as needed (for pain).       lidocaine (LIDODERM) 5 % Place 1 patch onto the skin daily. Remove & Discard patch within 12 hours or as directed by MD 30 patch 0   loratadine (EQ ALL DAY ALLERGY RELIEF) 10 MG tablet Take 1 tablet (10 mg total) by mouth daily. 90 tablet 0   methocarbamol (ROBAXIN) 500 MG tablet Take 1 tablet by mouth 4 times daily 30 tablet 0   montelukast (SINGULAIR) 10 MG tablet Take 1 tablet (10 mg total) by mouth at bedtime. 30 tablet 5   Multiple Vitamin (MULTIVITAMIN WITH MINERALS) TABS  tablet Take  1 tablet by mouth daily.       naproxen (NAPROSYN) 500 MG tablet Take 1 tablet by mouth twice daily 60 tablet 0   olmesartan-hydrochlorothiazide (BENICAR HCT) 20-12.5 MG tablet Take 1 tablet by mouth daily. 90 tablet 3   Omega-3 Fatty Acids (FISH OIL) 1000 MG CAPS Take by mouth.       omeprazole (PRILOSEC) 20 MG capsule Take 1 capsule (20 mg total) by mouth daily. 90 capsule 0   traZODone (DESYREL) 50 MG tablet Take 0.5-1 tablets (25-50 mg total) by mouth at bedtime as needed for sleep. 30 tablet 3   VENTOLIN HFA 108 (90 Base) MCG/ACT inhaler INHALE 1 TO 2 PUFFS BY MOUTH EVERY 4 HOURS AS NEEDED FOR WHEEZING OR SHORTNESS OF BREATH (Patient taking differently: Inhale 1-2 puffs into the lungs every 4 (four) hours as needed for wheezing or shortness of breath.) 18 g 2   linaclotide (LINZESS) 145 MCG CAPS capsule Take 1 capsule (145 mcg total) by mouth daily. Take this medication on an empty stomach. 30 capsule 3      No current facility-administered medications for this visit.        Review of Systems: No chest pain. No shortness of breath. No urinary complaints.      Physical Exam      Wt Readings from Last 3 Encounters:  09/24/22 201 lb 6.4 oz (91.4 kg)  09/02/22 199 lb 8 oz (90.5 kg)  09/01/22 199 lb (90.3 kg)      BP 130/82   Pulse 94   Ht 5\' 4"  (1.626 m)   Wt 201 lb 6.4 oz (91.4 kg)   SpO2 100%   BMI 34.57 kg/m  Constitutional:  Pleasant, generally well appearing female in no acute distress. Psychiatric: Normal mood and affect. Behavior is normal. EENT: Pupils normal.  Conjunctivae are normal. No scleral icterus. Neck supple.  Cardiovascular: Normal rate, regular rhythm.  Pulmonary/chest: Effort normal and breath sounds normal. No wheezing, rales or rhonchi. Abdominal: Soft, nondistended, nontender. Bowel sounds active throughout. There are no masses palpable. No hepatomegaly. Neurological: Alert and oriented to person place and time.      Willette Cluster,  NP   Attending physician's note   I have taken history, reviewed the chart and examined the patient. I performed a substantive portion of this encounter, including complete performance of at least one of the key components, in conjunction with the APP. I agree with the Advanced Practitioner's note, impression and recommendations.    Edman Circle, MD [facility] [phone]

## 2023-03-30 NOTE — Progress Notes (Signed)
 Pt's states no medical or surgical changes since previsit or office visit.

## 2023-03-30 NOTE — Progress Notes (Signed)
 Sedate, gd SR, tolerated procedure well, VSS, report to RN

## 2023-03-30 NOTE — Op Note (Signed)
 [facility] Patient Name: Haley Nielsen Procedure Date: 03/30/2023 10:43 AM MRN: [id number] Endoscopist: Lynann Bologna , MD, [id number] Age: 58 Referring MD:  Date of Birth: July 11, 1965 Gender: Female Account #: [id number] Procedure:                Upper GI endoscopy Indications:              Dysphagia Medicines:                Monitored Anesthesia Care Procedure:                Pre-Anesthesia Assessment:                           - Prior to the procedure, a History and Physical                            was performed, and patient medications and                            allergies were reviewed. The patient's tolerance of                            previous anesthesia was also reviewed. The risks                            and benefits of the procedure and the sedation                            options and risks were discussed with the patient.                            All questions were answered, and informed consent                            was obtained. Prior Anticoagulants: The patient has                            taken no anticoagulant or antiplatelet agents. ASA                            Grade Assessment: II - A patient with mild systemic                            disease. After reviewing the risks and benefits,                            the patient was deemed in satisfactory condition to                            undergo the procedure.                           After obtaining informed consent, the endoscope was  passed under direct vision. Throughout the                            procedure, the patient's blood pressure, pulse, and                            oxygen saturations were monitored continuously. The                            GIF [id number] #[id number] was introduced through the                            mouth, and advanced to the second part of duodenum.                            The upper GI endoscopy was accomplished  without                            difficulty. The patient tolerated the procedure                            well. Scope In: Scope Out: Findings:                 One benign-appearing, intrinsic mild stenosis was                            found 35 cm from the incisors. This stenosis                            measured 1.2 cm (inner diameter) x less than one cm                            (in length). The stenosis was traversed. The scope                            was withdrawn. Dilation was performed with a                            Maloney dilator with mild resistance at 52 Fr and                            54 Fr. Biopsies were obtained from the proximal and                            distal esophagus with cold forceps for histology of                            suspected eosinophilic esophagitis.                           Localized moderate inflammation characterized by  erosions, erythema, granularity and shallow                            confluent ulcerations was found in the gastric                            antrum and in the prepyloric region of the stomach.                            Biopsies were taken with a cold forceps for                            histology.                           The examined duodenum was normal. Complications:            No immediate complications. Estimated Blood Loss:     Estimated blood loss: none. Impression:               - Benign-appearing esophageal stenosis. Dilated.                           - Gastritis with superficial ulcerations/erosions.                            Biopsied. Recommendation:           - Patient has a contact number available for                            emergencies. The signs and symptoms of potential                            delayed complications were discussed with the                            patient. Return to normal activities tomorrow.                            Written discharge  instructions were provided to the                            patient.                           - Postdil diet.                           - Continue present medications.                           - Await pathology results.                           - Use Protonix (pantoprazole) 40 mg PO daily #90,  4RF. Take pepcid at bed time                           - The findings and recommendations were discussed                            with the patient's family. Lynann Bologna, MD 03/30/2023 11:03:09 AM This report has been signed electronically.

## 2023-03-30 NOTE — Progress Notes (Signed)
 Called to room to assist during endoscopic procedure.  Patient ID and intended procedure confirmed with present staff. Received instructions for my participation in the procedure from the performing physician.

## 2023-03-30 NOTE — Patient Instructions (Signed)
 Handout provided on post esophageal dilation diet.  Use Protonix 40mg  daily (medication sent to pharmacy) and Pepcid at bedtime.    YOU HAD AN ENDOSCOPIC PROCEDURE TODAY AT THE [facility]:   Refer to the procedure report that was given to you for any specific questions about what was found during the examination.  If the procedure report does not answer your questions, please call your gastroenterologist to clarify.  If you requested that your care partner not be given the details of your procedure findings, then the procedure report has been included in a sealed envelope for you to review at your convenience later.  YOU SHOULD EXPECT: Some feelings of bloating in the abdomen. Passage of more gas than usual.  Walking can help get rid of the air that was put into your GI tract during the procedure and reduce the bloating. If you had a lower endoscopy (such as a colonoscopy or flexible sigmoidoscopy) you may notice spotting of blood in your stool or on the toilet paper. If you underwent a bowel prep for your procedure, you may not have a normal bowel movement for a few days.  Please Note:  You might notice some irritation and congestion in your nose or some drainage.  This is from the oxygen used during your procedure.  There is no need for concern and it should clear up in a day or so.  SYMPTOMS TO REPORT IMMEDIATELY:  Following lower endoscopy (colonoscopy or flexible sigmoidoscopy):  Excessive amounts of blood in the stool  Significant tenderness or worsening of abdominal pains  Swelling of the abdomen that is new, acute  Fever of 100F or higher  Following upper endoscopy (EGD)  Vomiting of blood or coffee ground material  New chest pain or pain under the shoulder blades  Painful or persistently difficult swallowing  New shortness of breath  Fever of 100F or higher  Black, tarry-looking stools  For urgent or emergent issues, a gastroenterologist can be reached at any hour by  calling [phone]. Do not use MyChart messaging for urgent concerns.    DIET:  Follow post dilation diet handout.  Drink plenty of fluids but you should avoid alcoholic beverages for 24 hours.  ACTIVITY:  You should plan to take it easy for the rest of today and you should NOT DRIVE or use heavy machinery until tomorrow (because of the sedation medicines used during the test).    FOLLOW UP: Our staff will call the number listed on your records the next business day following your procedure.  We will call around 7:15- 8:00 am to check on you and address any questions or concerns that you may have regarding the information given to you following your procedure. If we do not reach you, we will leave a message.     If any biopsies were taken you will be contacted by phone or by letter within the next 1-3 weeks.  Please call us at [phone] if you have not heard about the biopsies in 3 weeks.    SIGNATURES/CONFIDENTIALITY: You and/or your care partner have signed paperwork which will be entered into your electronic medical record.  These signatures attest to the fact that that the information above on your After Visit Summary has been reviewed and is understood.  Full responsibility of the confidentiality of this discharge information lies with you and/or your care-partner.

## 2023-03-31 ENCOUNTER — Telehealth: Payer: Self-pay | Admitting: Gastroenterology

## 2023-03-31 ENCOUNTER — Telehealth: Payer: Self-pay | Admitting: *Deleted

## 2023-03-31 DIAGNOSIS — K219 Gastro-esophageal reflux disease without esophagitis: Secondary | ICD-10-CM

## 2023-03-31 MED ORDER — PANTOPRAZOLE SODIUM 40 MG PO TBEC: 40.0000 mg | DELAYED_RELEASE_TABLET | Freq: Every day | ORAL | 4 refills | Status: AC

## 2023-03-31 NOTE — Telephone Encounter (Signed)
 Inbound call from patient stating her pharmacy has not received pantoprazole medication that was sent after yesterday's 3/11 procedure. Patient is requesting a call back. Please advise, thank you.

## 2023-03-31 NOTE — Telephone Encounter (Signed)
 Pantoprazole resend to [facility] in [place]. Voicemail left for patient with the info.

## 2023-03-31 NOTE — Telephone Encounter (Signed)
 No answer on  follow up call. Left message.

## 2023-04-04 ENCOUNTER — Other Ambulatory Visit: Payer: Self-pay | Admitting: Family Medicine

## 2023-04-04 ENCOUNTER — Other Ambulatory Visit: Payer: Self-pay | Admitting: Nurse Practitioner

## 2023-04-04 DIAGNOSIS — J4540 Moderate persistent asthma, uncomplicated: Secondary | ICD-10-CM

## 2023-04-04 DIAGNOSIS — J3089 Other allergic rhinitis: Secondary | ICD-10-CM

## 2023-04-04 DIAGNOSIS — E782 Mixed hyperlipidemia: Secondary | ICD-10-CM

## 2023-04-05 LAB — SURGICAL PATHOLOGY

## 2023-04-27 ENCOUNTER — Other Ambulatory Visit: Payer: Self-pay | Admitting: Family Medicine

## 2023-04-27 ENCOUNTER — Encounter: Payer: Self-pay | Admitting: Gastroenterology

## 2023-04-27 DIAGNOSIS — J4489 Other specified chronic obstructive pulmonary disease: Secondary | ICD-10-CM

## 2023-04-28 ENCOUNTER — Other Ambulatory Visit: Payer: Self-pay | Admitting: Family Medicine

## 2023-04-28 DIAGNOSIS — J309 Allergic rhinitis, unspecified: Secondary | ICD-10-CM

## 2023-04-30 ENCOUNTER — Other Ambulatory Visit: Payer: Self-pay | Admitting: Family Medicine

## 2023-04-30 DIAGNOSIS — I700 Atherosclerosis of aorta: Secondary | ICD-10-CM

## 2023-04-30 DIAGNOSIS — Z1231 Encounter for screening mammogram for malignant neoplasm of breast: Secondary | ICD-10-CM

## 2023-05-02 ENCOUNTER — Other Ambulatory Visit: Payer: Self-pay | Admitting: Nurse Practitioner

## 2023-05-02 DIAGNOSIS — J4540 Moderate persistent asthma, uncomplicated: Secondary | ICD-10-CM

## 2023-05-02 DIAGNOSIS — J3089 Other allergic rhinitis: Secondary | ICD-10-CM

## 2023-05-03 ENCOUNTER — Telehealth: Payer: Self-pay | Admitting: Cardiology

## 2023-05-03 ENCOUNTER — Telehealth: Payer: Self-pay

## 2023-05-03 NOTE — Telephone Encounter (Signed)
 Spoke to patient. Verified name and DOB.  Patient states has been feeling chest pain and numbness in left arm on and off for 2 weeks. States having shortness of breath and low energy as well these past 2 weeks.  Patient has not taken nitroglycerin and no prescription in chart.  Patient denies dizziness, headache, jaw pain, and arm/shoulder pain. Patient checked blood pressure while on phone. It read 111/64. Made patient appt with DOD Thursday 05/06/23 at 2:20.  Advised patient to go to ER to be evaluated if symptoms worsen prior to appointment. Patient verbalized understanding.  Josie LPN

## 2023-05-03 NOTE — Telephone Encounter (Signed)
   Pt c/o of Chest Pain: STAT if active CP, including tightness, pressure, jaw pain, radiating pain to shoulder/upper arm/back, CP unrelieved by Nitro. Symptoms reported of SOB, nausea, vomiting, sweating.  1. Are you having CP right now? Yes    2. Are you experiencing any other symptoms (ex. SOB, nausea, vomiting, sweating)? Fatigue, SOB    3. Is your CP continuous or coming and going? Coming and going    4. Have you taken Nitroglycerin? No    5. How long have you been experiencing CP? Two weeks     6. If NO CP at time of call then end call with telling Pt to call back or call 911 if Chest pain returns prior to return call from triage team.

## 2023-05-03 NOTE — Telephone Encounter (Signed)
 Copied from CRM [id number]. Topic: Clinical - Red Word Triage >> May 03, 2023 11:41 AM Elle L wrote: Red Word that prompted transfer to Nurse Triage: The patient called as she was having chest pain and wanted her Cardiologist's number. I provided her with the number and she declined speaking to a Nurse. However, per Supervisor I am sending to the Nurses. Her call back number is [phone].

## 2023-05-06 ENCOUNTER — Ambulatory Visit: Admitting: Cardiology

## 2023-05-06 ENCOUNTER — Telehealth: Payer: Self-pay | Admitting: Cardiology

## 2023-05-06 NOTE — Telephone Encounter (Signed)
 Patient called c/o of chest pain that comes and goes that been going on for 3 weeks. Laying down makes it better. C/o of mild headache, dizziness, denies nausea,. Bp 142/86 and heart rate 91. Advise patient to go to Emergency room.

## 2023-05-06 NOTE — Telephone Encounter (Signed)
   Pt c/o of Chest Pain: STAT if active CP, including tightness, pressure, jaw pain, radiating pain to shoulder/upper arm/back, CP unrelieved by Nitro. Symptoms reported of SOB, nausea, vomiting, sweating.  1. Are you having CP right now? Not at [facility], but earlier    2. Are you experiencing any other symptoms (ex. SOB, nausea, vomiting, sweating)? Short of breath at this time, headache   3. Is your CP continuous or coming and going? Comes and goes   4. Have you taken Nitroglycerin? no   5. How long have you been experiencing CP? A couple of week    6. If NO CP at time of call then end call with telling Pt to call back or call 911 if Chest pain returns prior to return call from triage team.

## 2023-05-08 ENCOUNTER — Other Ambulatory Visit: Payer: Self-pay | Admitting: Family Medicine

## 2023-05-08 DIAGNOSIS — M62831 Muscle spasm of calf: Secondary | ICD-10-CM

## 2023-05-11 NOTE — Telephone Encounter (Signed)
 Last OV 03/08/23. Last RF 12/28/22 #30 3RF Next OV 06/11/23  Morgan's patient

## 2023-05-11 NOTE — Telephone Encounter (Signed)
 Will bridge until follow up with PCP. Recommend that she follow up with neurosurgery as planned if continuing to have pain.

## 2023-05-20 ENCOUNTER — Other Ambulatory Visit (HOSPITAL_COMMUNITY): Payer: Self-pay

## 2023-05-20 ENCOUNTER — Telehealth: Payer: Self-pay | Admitting: Pharmacy Technician

## 2023-05-20 NOTE — Telephone Encounter (Signed)
 Pharmacy Patient Advocate Encounter   Received notification from CoverMyMeds that prior authorization for Albuterol  Sulfate HFA 108 (90 Base)MCG/ACT aerosol  is required/requested.   Insurance verification completed.   The patient is insured through [facility] [place] .   Per test claim:  VENTOLIN  HFA is preferred by the insurance.  If suggested medication is appropriate, Please send in a new RX and discontinue this one. If not, please advise as to why it's not appropriate so that we may request a Prior Authorization. Please note, some preferred medications may still require a PA.  If the suggested medications have not been trialed and there are no contraindications to their use, the PA will not be submitted, as it will not be approved.   I called [facility] in [place] this morning and they are asking that the provider send in a new prescription for Ventolin  HFA 18 grams because the order from 04/27/23 they used Proair  previously and she can not change the qty on that order because Proair  is for 6.7 grams while Ventolin  is for 18 grams.  Thanks so much!!

## 2023-05-21 ENCOUNTER — Other Ambulatory Visit (HOSPITAL_COMMUNITY): Payer: Self-pay

## 2023-06-11 ENCOUNTER — Ambulatory Visit: Payer: No Typology Code available for payment source | Admitting: Family Medicine

## 2023-06-15 NOTE — Progress Notes (Deleted)
 Cardiology Clinic Note   Patient Name: Haley Nielsen Date of Encounter: 06/15/2023  Primary Care Provider:  Albertha Huger, FNP Primary Cardiologist:  None  Patient Profile    Haley Nielsen 58 year old female presents to the clinic today for evaluation of her chest pain and shortness of breath.  Past Medical History    Past Medical History:  Diagnosis Date   Allergic rhinitis    Allergy    Anemia    Asthma    Blurry vision    Bronchospasm    Chest pain    Dizziness    Elevated CK-MB level    GERD (gastroesophageal reflux disease)    Heart murmur    Hyperlipidemia    Hypertension    Neuropathy 05/15/2021   Syncope    Syncope    Tobacco abuse    Weakness    Past Surgical History:  Procedure Laterality Date   TUBAL LIGATION      Allergies  Allergies  Allergen Reactions   Penicillins Hives    History of Present Illness    Haley Nielsen has a PMH of chest discomfort, syncope and collapse, shortness of breath, HLD, and HTN.  She was previously followed by Dr. Felipe Horton in 2019.  Her echocardiogram 2024 showed an EF of 60 to 65%.  She was hospitalized in 2024 for left-sided weakness.  She reported that she was unable to move her arm or leg.  Her MRI and CT were unremarkable.  She followed up with neurology.  Echocardiogram was ordered at that time.  She was treated with aspirin .  She noted back pain.  She did have cervical disc issues which were noted on x-ray.  She was also noted to have degenerative disc problems on MRI in the lumbar region.  She was seen in follow-up by Dr. Lavonne Prairie on 07/08/2022.  During that time she reported chest discomfort.  She described her episodes as fleeting sharp discomfort.  She occasionally noted palpitations.  She reported a couple of episodes of syncope which were unwitnessed and were months prior.  She reported that she would wake up on the floor.  She denied injury.  She reported that she was not working due to persistent  left-sided weakness.  She noted that she had been using a walker occasionally.  Her chest CT was reviewed.  This was ordered for chest discomfort and due to her smoking history.  It was felt that she had several somatic complaints.  Cardiac event monitor was ordered and showed no significant arrhythmias.  Her symptoms occurred with normal sinus rhythm no further workup was recommended.  She contacted nurse triage line on 05/06/2023.  She reported that for the last several weeks she had been having shortness of breath and headache.  She denied chest pain but noted she had previously had chest discomfort.  She noted her blood pressure was 142/86 and her heart rate was 91.  She presents to the clinic today for follow-up evaluation and states***.  *** denies chest pain, shortness of breath, lower extremity edema, fatigue, palpitations, melena, hematuria, hemoptysis, diaphoresis, weakness, presyncope, syncope, orthopnea, and PND.  Chest discomfort-Notes brief intermittent episodes of sharp chest discomfort.  These have been occurring for several weeks.  I explained that these are related to precordial type pain.  She denies exertional type symptoms. Patient reassured that her symptoms were not related to cardiac issues. No plans for ischemic evaluation  Shortness of breath, dyspnea-Notes episodes of shortness of breath again for several weeks.  She denies sick contacts.  Episodes are intermittent and not always related to or accompanied with physical activity.  Euvolemic. Order echocardiogram Heart healthy low-sodium diet Increase physical activity as tolerated  Palpitations-denies recent episodes of accelerated or irregular heartbeat.  Previous echocardiogram 7/24 showed no significant arrhythmias and normal sinus rhythm.  Symptoms occurred during normal sinus rhythm. Avoid triggers caffeine, chocolate, EtOH, dehydration etc.  Disposition: Follow-up with Dr. Lavonne Prairie or me after  echocardiogram.  Home Medications    Prior to Admission medications   Medication Sig Start Date End Date Taking? Authorizing Provider  albuterol  (VENTOLIN  HFA) 108 (90 Base) MCG/ACT inhaler INHALE 2 PUFFS BY MOUTH EVERY 6 HOURS AS NEEDED FOR WHEEZING AND FOR SHORTNESS OF BREATH 04/27/23   Albertha Huger, FNP  amLODipine  (NORVASC ) 10 MG tablet Take 1 tablet (10 mg total) by mouth daily. 07/03/22   Albertha Huger, FNP  aspirin  EC (EQ ASPIRIN  ADULT LOW DOSE) 81 MG tablet TAKE 1 TABLET BY MOUTH ONCE DAILY. SWALLOW WHOLE 05/03/23   Albertha Huger, FNP  atorvastatin  (LIPITOR) 80 MG tablet Take 1 tablet (80 mg total) by mouth every evening. Keep on file 05/19/22   Rayfield Cairo, MD  azithromycin  (ZITHROMAX  Z-PAK) 250 MG tablet As directed 03/08/23   Albertha Huger, FNP  busPIRone  (BUSPAR ) 10 MG tablet Take 1 tablet (10 mg total) by mouth 2 (two) times daily. 01/22/23   Albertha Huger, FNP  citalopram  (CELEXA ) 20 MG tablet Take 20 mg by mouth daily. 07/27/22   [provider]  cyclobenzaprine  (FLEXERIL ) 10 MG tablet TAKE 1 TABLET BY MOUTH THREE TIMES DAILY AS NEEDED FOR MUSCLE SPASM DISCONTINUE  METHOCARBAMOL  05/11/23   Milian, Winda Hastings, FNP  DULoxetine  (CYMBALTA ) 60 MG capsule Take 60 mg by mouth daily. 02/21/23   [provider]  ezetimibe  (ZETIA ) 10 MG tablet Take 1 tablet by mouth once daily 04/05/23   Albertha Huger, FNP  famotidine  (PEPCID ) 20 MG tablet Take 1 tablet (20 mg total) by mouth at bedtime. 07/14/22   Arlee Bellows, NP  fluticasone  (FLONASE ) 50 MCG/ACT nasal spray Place 2 sprays into both nostrils daily. 09/30/22   Albertha Huger, FNP  Fluticasone -Umeclidin-Vilant (TRELEGY ELLIPTA ) 200-62.5-25 MCG/ACT AEPB Inhale 1 puff into the lungs daily. 09/28/22   Cobb, Mariah Shines, NP  gabapentin  (NEURONTIN ) 600 MG tablet Take 1 tablet (600 mg total) by mouth 3 (three) times daily. 09/02/22   Albertha Huger, FNP  hydrochlorothiazide  (HYDRODIURIL ) 25 MG tablet  Take 1 tablet (25 mg total) by mouth daily. 01/22/23   Albertha Huger, FNP  lidocaine  (LIDODERM ) 5 % Place 1 patch onto the skin daily. Remove & Discard patch within 12 hours or as directed by MD 11/05/21   Albertha Huger, FNP  loratadine  (EQ ALL DAY ALLERGY RELIEF) 10 MG tablet Take 1 tablet by mouth once daily 04/28/23   Albertha Huger, FNP  lubiprostone  (AMITIZA ) 24 MCG capsule Take 1 capsule (24 mcg total) by mouth 2 (two) times daily with a meal. 12/25/22   Arlee Bellows, NP  montelukast  (SINGULAIR ) 10 MG tablet TAKE 1 TABLET BY MOUTH AT BEDTIME 05/04/23   Cobb, Katherine V, NP  Multiple Vitamin (MULTIVITAMIN WITH MINERALS) TABS tablet Take 1 tablet by mouth daily.    [provider]  naloxone [facility]) nasal spray 4 mg/0.1 mL SMARTSIG:1 Both Nares Daily 01/08/23   [provider]  olmesartan  (BENICAR ) 40 MG tablet Take 1 tablet (40 mg total) by mouth  daily. 01/22/23   Albertha Huger, FNP  Omega-3 Fatty Acids (FISH OIL) 1000 MG CAPS Take by mouth.    [provider]  pantoprazole  (PROTONIX ) 40 MG tablet Take 1 tablet (40 mg total) by mouth daily. 03/31/23   Lajuan Pila, MD  PERCOCET 10-325 MG tablet Take 1 tablet 4 times a day by oral route as needed for pain for 7 days. 01/07/23   [provider]    Family History    Family History  Problem Relation Age of Onset   Colon polyps Mother    Hyperlipidemia Mother    Hypertension Mother    Stroke Father 52   Heart attack Maternal Grandmother    Cancer Paternal Grandmother        cervical cancer   Colon cancer Neg Hx    Esophageal cancer Neg Hx    Rectal cancer Neg Hx    Stomach cancer Neg Hx    Breast cancer Neg Hx    She indicated that her mother is alive. She indicated that her father is deceased. She indicated that her maternal grandmother is deceased. She indicated that her maternal grandfather is deceased. She indicated that her paternal grandmother is deceased. She indicated that her  paternal grandfather is deceased. She indicated that her daughter is alive. She indicated that her son is deceased. She indicated that the status of her neg hx is unknown.  Social History    Social History   Socioeconomic History   Marital status: Married    Spouse name: Not on file   Number of children: Not on file   Years of education: 11   Highest education level: 11th grade  Occupational History   Not on file  Tobacco Use   Smoking status: Every Day    Current packs/day: 0.50    Average packs/day: 0.5 packs/day for 15.0 years (7.5 ttl pk-yrs)    Types: Cigarettes   Smokeless tobacco: Never  Vaping Use   Vaping status: Never Used  Substance and Sexual Activity   Alcohol use: Not Currently   Drug use: Never   Sexual activity: Yes    Birth control/protection: Surgical  Other Topics Concern   Not on file  Social History Narrative   Not on file   Social Drivers of Health   Financial Resource Strain: Not on file  Food Insecurity: No Food Insecurity (05/18/2022)   Hunger Vital Sign    Worried About Running Out of Food in the Last Year: Never true    Ran Out of Food in the Last Year: Never true  Transportation Needs: No Transportation Needs (05/18/2022)   PRAPARE - Administrator, Civil Service (Medical): No    Lack of Transportation (Non-Medical): No  Physical Activity: Not on file  Stress: Not on file  Social Connections: Not on file  Intimate Partner Violence: Not At Risk (05/18/2022)   Humiliation, Afraid, Rape, and Kick questionnaire    Fear of Current or Ex-Partner: No    Emotionally Abused: No    Physically Abused: No    Sexually Abused: No     Review of Systems    General:  No chills, fever, night sweats or weight changes.  Cardiovascular:  No chest pain, dyspnea on exertion, edema, orthopnea, palpitations, paroxysmal nocturnal dyspnea. Dermatological: No rash, lesions/masses Respiratory: No cough, dyspnea Urologic: No hematuria,  dysuria Abdominal:   No nausea, vomiting, diarrhea, bright red blood per rectum, melena, or hematemesis Neurologic:  No visual changes, wkns, changes  in mental status. All other systems reviewed and are otherwise negative except as noted above.  Physical Exam    VS:  There were no vitals taken for this visit. , BMI There is no height or weight on file to calculate BMI. GEN: Well nourished, well developed, in no acute distress. HEENT: normal. Neck: Supple, no JVD, carotid bruits, or masses. Cardiac: RRR, no murmurs, rubs, or gallops. No clubbing, cyanosis, edema.  Radials/DP/PT 2+ and equal bilaterally.  Respiratory:  Respirations regular and unlabored, clear to auscultation bilaterally. GI: Soft, nontender, nondistended, BS + x 4. MS: no deformity or atrophy. Skin: warm and dry, no rash. Neuro:  Strength and sensation are intact. Psych: Normal affect.  Accessory Clinical Findings    Recent Labs: 09/30/2022: ALT 27; BUN 9; Creatinine, Ser 0.75; Hemoglobin 14.4; Platelets 381; Potassium 4.3; Sodium 142   Recent Lipid Panel    Component Value Date/Time   CHOL 138 05/19/2022 0452   CHOL 182 04/02/2022 0841   TRIG 68 05/19/2022 0452   HDL 46 05/19/2022 0452   HDL 47 04/02/2022 0841   CHOLHDL 3.0 05/19/2022 0452   VLDL 14 05/19/2022 0452   LDLCALC 78 05/19/2022 0452   LDLCALC 114 (H) 04/02/2022 0841    No BP recorded.  {Refresh Note OR Click here to enter BP  :1}***    ECG personally reviewed by me today- ***      Cardiac event monitor 08/19/2022   Normal sinus rhythm No sustained arrhythmias Symptoms of light headedness, chest pain, shortness of breath, skipped beats occurred during normal sinus rhythm   Assessment & Plan   1.  ***   Chet Cota. Vincient Vanaman NP-C     06/15/2023, 1:19 PM [facility] [address] Office [phone] Fax [phone]    I spent***minutes examining this patient, reviewing medications, and using  patient centered shared decision making involving their cardiac care.   I spent  20 minutes reviewing past medical history,  medications, and prior cardiac tests.

## 2023-06-16 ENCOUNTER — Ambulatory Visit: Attending: General Practice | Admitting: General Practice

## 2023-06-16 ENCOUNTER — Ambulatory Visit: Admitting: Family Medicine

## 2023-06-17 ENCOUNTER — Encounter: Payer: Self-pay | Admitting: Family Medicine

## 2023-06-21 ENCOUNTER — Other Ambulatory Visit: Payer: Self-pay | Admitting: Family Medicine

## 2023-06-21 DIAGNOSIS — I10 Essential (primary) hypertension: Secondary | ICD-10-CM

## 2023-06-24 ENCOUNTER — Other Ambulatory Visit: Payer: Self-pay | Admitting: Family Medicine

## 2023-06-24 DIAGNOSIS — M62831 Muscle spasm of calf: Secondary | ICD-10-CM

## 2023-06-28 ENCOUNTER — Ambulatory Visit

## 2023-06-30 ENCOUNTER — Ambulatory Visit (INDEPENDENT_AMBULATORY_CARE_PROVIDER_SITE_OTHER): Admitting: Family Medicine

## 2023-06-30 VITALS — BP 95/56 | HR 89 | Temp 98.0°F | Ht 64.0 in | Wt 205.6 lb

## 2023-06-30 DIAGNOSIS — I700 Atherosclerosis of aorta: Secondary | ICD-10-CM

## 2023-06-30 DIAGNOSIS — J309 Allergic rhinitis, unspecified: Secondary | ICD-10-CM

## 2023-06-30 DIAGNOSIS — F419 Anxiety disorder, unspecified: Secondary | ICD-10-CM

## 2023-06-30 DIAGNOSIS — F339 Major depressive disorder, recurrent, unspecified: Secondary | ICD-10-CM

## 2023-06-30 DIAGNOSIS — I10 Essential (primary) hypertension: Secondary | ICD-10-CM | POA: Diagnosis not present

## 2023-06-30 DIAGNOSIS — R7303 Prediabetes: Secondary | ICD-10-CM | POA: Diagnosis not present

## 2023-06-30 DIAGNOSIS — E782 Mixed hyperlipidemia: Secondary | ICD-10-CM

## 2023-06-30 DIAGNOSIS — J4489 Other specified chronic obstructive pulmonary disease: Secondary | ICD-10-CM

## 2023-06-30 MED ORDER — BUSPIRONE HCL 15 MG PO TABS: 15.0000 mg | ORAL_TABLET | Freq: Two times a day (BID) | ORAL | 3 refills | Status: AC

## 2023-06-30 MED ORDER — FLUTICASONE PROPIONATE 50 MCG/ACT NA SUSP: 2.0000 | Freq: Every day | NASAL | 6 refills | Status: AC

## 2023-06-30 MED ORDER — ALBUTEROL SULFATE HFA 108 (90 BASE) MCG/ACT IN AERS: 2.0000 | INHALATION_SPRAY | Freq: Four times a day (QID) | RESPIRATORY_TRACT | 1 refills | Status: DC | PRN

## 2023-06-30 NOTE — Progress Notes (Signed)
 Established Patient Office Visit  Subjective   Patient ID: Haley Nielsen, female    DOB: 1965/01/31  Age: 58 y.o. MRN: [id number]  Chief Complaint  Patient presents with   Medical Management of Chronic Issues    HPI  HTN Complaint with meds - Yes Current Medications - amlodipine  10 mg, olmesartan  40, HCTZ 25 Pertinent ROS:  Headache - Yes Fatigue - No Chest pain - yes, stable. She has been having cardic work up. Has follow up with cardiology next week Dyspnea - stable Palpitations - No LE edema - No  2. HLD On statin and zetia . Denies side effects.   3. Prediabetes Regular diet, no exercise.   4. Asthma/COPD Compliant with trelegy. Having shortness of breath, wheezing on most days. Dry cough. She is using albuterol  daily. She needs a refill on this and her flonase  nasal spray. Established with pulmonology.   5. GERD Compliant with medications - Yes Current medications - protonix   Cough - Yes Sore throat - No Voice change - No Hemoptysis - No Dysphagia or dyspepsia - Yes, mild. Stable.  Water brash - Yes Red Flags (weight loss, hematochezia, melena, weight loss, early satiety, fevers, odynophagia, or persistent vomiting) - No   6. Back Pain She continues to have significant pain. Still requring assistance with ADLs, inability to stand for more than a few minutes. Has had epidural injection x 4 now without relief. Had appt with Dr. Rexanne Catalina earlier this week. Has MRI scheduled and will follow up. Discussing possibility of surgery. She has been taken out of work permanently.   7. Anxiety/depression Taking cymbalta  daily. Buspar  10 mg BID. Denies SI. Stable symptoms. Related to chronic pain and changes in lifestyle because of it.      06/30/2023    1:30 PM 03/08/2023    8:59 AM 01/22/2023   10:54 AM  Depression screen PHQ 2/9  Decreased Interest 3 2 2   Down, Depressed, Hopeless 2 2 2   PHQ - 2 Score 5 4 4   Altered sleeping 3 2 2   Tired, decreased energy 3 2 3   Change  in appetite 2 1 3   Feeling bad or failure about yourself  1 1 2   Trouble concentrating 2 1 3   Moving slowly or fidgety/restless 1 1 2   Suicidal thoughts 0 0 0  PHQ-9 Score 17 12 19   Difficult doing work/chores Not difficult at all Not difficult at all Somewhat difficult      06/30/2023    1:32 PM 03/08/2023    9:00 AM 01/22/2023   10:55 AM 11/12/2022    2:48 PM  GAD 7 : Generalized Anxiety Score  Nervous, Anxious, on Edge 1 2 2 1   Control/stop worrying 2 2 2 1   Worry too much - different things 3 2 2 1   Trouble relaxing 3 3 3 2   Restless 1 2 2 2   Easily annoyed or irritable 3 1 2 1   Afraid - awful might happen 3 2 2 1   Total GAD 7 Score 16 14 15 9   Anxiety Difficulty Not difficult at all Not difficult at all Somewhat difficult Not difficult at all      Past Medical History:  Diagnosis Date   Allergic rhinitis    Allergy    Anemia    Asthma    Blurry vision    Bronchospasm    Chest pain    Dizziness    Elevated CK-MB level    GERD (gastroesophageal reflux disease)    Heart murmur  Hyperlipidemia    Hypertension    Neuropathy 05/15/2021   Syncope    Syncope    Tobacco abuse    Weakness       ROS As per HPI.    Objective:     BP (!) 95/56   Pulse 89   Temp 98 F (36.7 C) (Temporal)   Ht 5' 4 (1.626 m)   Wt 205 lb 9.6 oz (93.3 kg)   SpO2 95%   BMI 35.29 kg/m  BP Readings from Last 3 Encounters:  06/30/23 (!) 95/56  03/30/23 113/78  03/08/23 137/87   Wt Readings from Last 3 Encounters:  06/30/23 205 lb 9.6 oz (93.3 kg)  03/30/23 210 lb (95.3 kg)  03/08/23 201 lb 6.4 oz (91.4 kg)     Physical Exam Vitals and nursing note reviewed.  Constitutional:      General: She is not in acute distress.    Appearance: She is not ill-appearing, toxic-appearing or diaphoretic.   Cardiovascular:     Rate and Rhythm: Normal rate and regular rhythm.     Heart sounds: Normal heart sounds. No murmur heard. Pulmonary:     Effort: Pulmonary effort is normal.  No respiratory distress.     Breath sounds: Normal breath sounds. No wheezing or rhonchi.  Abdominal:     General: Bowel sounds are normal. There is no distension.     Palpations: Abdomen is soft.     Tenderness: There is no abdominal tenderness.   Musculoskeletal:        General: No swelling.     Right lower leg: No edema.     Left lower leg: No edema.   Skin:    General: Skin is warm and dry.   Neurological:     Mental Status: She is alert and oriented to person, place, and time.     Cranial Nerves: No facial asymmetry.     Sensory: Sensation is intact.     Gait: Gait abnormal (antalgic gait).   Psychiatric:        Mood and Affect: Mood normal.        Behavior: Behavior normal.      No results found for any visits on 06/30/23.    The 10-year ASCVD risk score (Arnett DK, et al., 2019) is: 3.8%    Assessment & Plan:   Violanda was seen today for medical management of chronic issues.  Diagnoses and all orders for this visit:  Primary hypertension BP is a little soft today. Denies symptoms. Monitor at home and notify for elevated or low readings.  -     CBC with Differential/Platelet; Future -     CMP14+EGFR; Future -     TSH; Future  Prediabetes A1c pending.  -     Bayer DCA Hb A1c Waived; Future  Aortic atherosclerosis (HCC) On statin and aspirin .   Mixed hyperlipidemia On statin, omega 3, and zetia . She will return for fasting lipid panel.  -     Lipid panel; Future  Chronic allergic rhinitis Stable. Continue flonase , loratadine .  -     fluticasone  (FLONASE ) 50 MCG/ACT nasal spray; Place 2 sprays into both nostrils daily.  COPD with asthma (HCC) Stable. Continue trelegy. Albuterol  prn.  -     albuterol  (VENTOLIN  HFA) 108 (90 Base) MCG/ACT inhaler; Inhale 2 puffs into the lungs every 6 (six) hours as needed for wheezing or shortness of breath.  Anxiety Depression, recurrent (HCC) Not well controlled. Denies SI. Increase buspar  to 15 mg  BID. Continue  cymbalta .  -     busPIRone  (BUSPAR ) 15 MG tablet; Take 1 tablet (15 mg total) by mouth 2 (two) times daily.   Return in about 3 months (around 09/30/2023) for chronic follow up.   The patient indicates understanding of these issues and agrees with the plan.  Albertha Huger, FNP

## 2023-07-02 ENCOUNTER — Other Ambulatory Visit: Payer: Self-pay | Admitting: Family Medicine

## 2023-07-02 DIAGNOSIS — E782 Mixed hyperlipidemia: Secondary | ICD-10-CM

## 2023-07-05 ENCOUNTER — Encounter: Payer: Self-pay | Admitting: Family Medicine

## 2023-07-05 NOTE — Progress Notes (Unsigned)
 Cardiology Clinic Note   Patient Name: Haley Nielsen Date of Encounter: 07/06/2023  Primary Care Provider:  Albertha Huger, FNP Primary Cardiologist:  None  Patient Profile    Haley Nielsen 58 year old female presents to the clinic today for evaluation of her chest pain and shortness of breath.  Past Medical History    Past Medical History:  Diagnosis Date   Allergic rhinitis    Allergy    Anemia    Asthma    Blurry vision    Bronchospasm    Chest pain    Dizziness    Elevated CK-MB level    GERD (gastroesophageal reflux disease)    Heart murmur    Hyperlipidemia    Hypertension    Neuropathy 05/15/2021   Syncope    Syncope    Tobacco abuse    Weakness    Past Surgical History:  Procedure Laterality Date   TUBAL LIGATION      Allergies  Allergies  Allergen Reactions   Penicillins Hives    History of Present Illness    Haley Nielsen has a PMH of chest discomfort, syncope and collapse, shortness of breath, HLD, and HTN.  She was previously followed by Dr. Felipe Horton in 2019.  Her echocardiogram 2024 showed an EF of 60 to 65%.  She was hospitalized in 2024 for left-sided weakness.  She reported that she was unable to move her arm or leg.  Her MRI and CT were unremarkable.  She followed up with neurology.  Echocardiogram was ordered at that time.  She was treated with aspirin .  She noted back pain.  She did have cervical disc issues which were noted on x-ray.  She was also noted to have degenerative disc problems on MRI in the lumbar region.  She was seen in follow-up by Dr. Lavonne Prairie on 07/08/2022.  During that time she reported chest discomfort.  She described her episodes as fleeting sharp discomfort.  She occasionally noted palpitations.  She reported a couple of episodes of syncope which were unwitnessed and were months prior.  She reported that she would wake up on the floor.  She denied injury.  She reported that she was not working due to persistent  left-sided weakness.  She noted that she had been using a walker occasionally.  Her chest CT was reviewed.  This was ordered for chest discomfort and due to her smoking history.  It was felt that she had several somatic complaints.  Cardiac event monitor was ordered and showed no significant arrhythmias.  Her symptoms occurred with normal sinus rhythm no further workup was recommended.  She contacted nurse triage line on 05/06/2023.  She reported that for the last several weeks she had been having shortness of breath and headache.  She denied chest pain but noted she had previously had chest discomfort.  She noted her blood pressure was 142/86 and her heart rate was 91.  She presents to the clinic today for follow-up evaluation and states she has been noticing chest discomfort for several months.  She describes it as sharp pain that is intermittent.  We reviewed her previous clinic visit and cardiac event monitor.  She expressed understanding.  She notes that she is right-handed.  She continues to smoke about 5 cigarettes/day.  She does note some pain with coughing.  She notes that she will have lab work drawn with her primary care physician tomorrow.  On exam her chest discomfort and back discomfort is reproducible with deep palpation.  I explained that this is related to musculoskeletal soreness.  I recommended heat, ice, over-the-counter medications and rest.  She expressed understanding.  I will plan follow-up in 12 months..  Today she denies increase shortness of breath, lower extremity edema, fatigue, melena, hematuria, hemoptysis, diaphoresis, weakness, presyncope, syncope, orthopnea, and PND.    Home Medications    Prior to Admission medications   Medication Sig Start Date End Date Taking? Authorizing Provider  albuterol  (VENTOLIN  HFA) 108 (90 Base) MCG/ACT inhaler INHALE 2 PUFFS BY MOUTH EVERY 6 HOURS AS NEEDED FOR WHEEZING AND FOR SHORTNESS OF BREATH 04/27/23   Albertha Huger, FNP   amLODipine  (NORVASC ) 10 MG tablet Take 1 tablet (10 mg total) by mouth daily. 07/03/22   Albertha Huger, FNP  aspirin  EC (EQ ASPIRIN  ADULT LOW DOSE) 81 MG tablet TAKE 1 TABLET BY MOUTH ONCE DAILY. SWALLOW WHOLE 05/03/23   Albertha Huger, FNP  atorvastatin  (LIPITOR) 80 MG tablet Take 1 tablet (80 mg total) by mouth every evening. Keep on file 05/19/22   Rayfield Cairo, MD  azithromycin  (ZITHROMAX  Z-PAK) 250 MG tablet As directed 03/08/23   Albertha Huger, FNP  busPIRone  (BUSPAR ) 10 MG tablet Take 1 tablet (10 mg total) by mouth 2 (two) times daily. 01/22/23   Albertha Huger, FNP  citalopram  (CELEXA ) 20 MG tablet Take 20 mg by mouth daily. 07/27/22   [provider]  cyclobenzaprine  (FLEXERIL ) 10 MG tablet TAKE 1 TABLET BY MOUTH THREE TIMES DAILY AS NEEDED FOR MUSCLE SPASM DISCONTINUE  METHOCARBAMOL  05/11/23   Milian, Winda Hastings, FNP  DULoxetine  (CYMBALTA ) 60 MG capsule Take 60 mg by mouth daily. 02/21/23   [provider]  ezetimibe  (ZETIA ) 10 MG tablet Take 1 tablet by mouth once daily 04/05/23   Albertha Huger, FNP  famotidine  (PEPCID ) 20 MG tablet Take 1 tablet (20 mg total) by mouth at bedtime. 07/14/22   Guenther, Paula M, NP  fluticasone  (FLONASE ) 50 MCG/ACT nasal spray Place 2 sprays into both nostrils daily. 09/30/22   Albertha Huger, FNP  Fluticasone -Umeclidin-Vilant (TRELEGY ELLIPTA ) 200-62.5-25 MCG/ACT AEPB Inhale 1 puff into the lungs daily. 09/28/22   Cobb, Mariah Shines, NP  gabapentin  (NEURONTIN ) 600 MG tablet Take 1 tablet (600 mg total) by mouth 3 (three) times daily. 09/02/22   Albertha Huger, FNP  hydrochlorothiazide  (HYDRODIURIL ) 25 MG tablet Take 1 tablet (25 mg total) by mouth daily. 01/22/23   Albertha Huger, FNP  lidocaine  (LIDODERM ) 5 % Place 1 patch onto the skin daily. Remove & Discard patch within 12 hours or as directed by MD 11/05/21   Albertha Huger, FNP  loratadine  (EQ ALL DAY ALLERGY RELIEF) 10 MG tablet Take 1 tablet by mouth once  daily 04/28/23   Albertha Huger, FNP  lubiprostone  (AMITIZA ) 24 MCG capsule Take 1 capsule (24 mcg total) by mouth 2 (two) times daily with a meal. 12/25/22   Arlee Bellows, NP  montelukast  (SINGULAIR ) 10 MG tablet TAKE 1 TABLET BY MOUTH AT BEDTIME 05/04/23   Cobb, Katherine V, NP  Multiple Vitamin (MULTIVITAMIN WITH MINERALS) TABS tablet Take 1 tablet by mouth daily.    [provider]  naloxone [facility]) nasal spray 4 mg/0.1 mL SMARTSIG:1 Both Nares Daily 01/08/23   [provider]  olmesartan  (BENICAR ) 40 MG tablet Take 1 tablet (40 mg total) by mouth daily. 01/22/23   Albertha Huger, FNP  Omega-3 Fatty Acids (FISH OIL) 1000 MG CAPS Take by mouth.  [provider]  pantoprazole  (PROTONIX ) 40 MG tablet Take 1 tablet (40 mg total) by mouth daily. 03/31/23   Lajuan Pila, MD  PERCOCET 10-325 MG tablet Take 1 tablet 4 times a day by oral route as needed for pain for 7 days. 01/07/23   [provider]    Family History    Family History  Problem Relation Age of Onset   Colon polyps Mother    Hyperlipidemia Mother    Hypertension Mother    Stroke Father 70   Heart attack Maternal Grandmother    Cancer Paternal Grandmother        cervical cancer   Colon cancer Neg Hx    Esophageal cancer Neg Hx    Rectal cancer Neg Hx    Stomach cancer Neg Hx    Breast cancer Neg Hx    She indicated that her mother is alive. She indicated that her father is deceased. She indicated that her maternal grandmother is deceased. She indicated that her maternal grandfather is deceased. She indicated that her paternal grandmother is deceased. She indicated that her paternal grandfather is deceased. She indicated that her daughter is alive. She indicated that her son is deceased. She indicated that the status of her neg hx is unknown.  Social History    Social History   Socioeconomic History   Marital status: Married    Spouse name: Not on file   Number of children:  Not on file   Years of education: 11   Highest education level: 11th grade  Occupational History   Not on file  Tobacco Use   Smoking status: Every Day    Current packs/day: 0.50    Average packs/day: 0.5 packs/day for 15.0 years (7.5 ttl pk-yrs)    Types: Cigarettes   Smokeless tobacco: Never  Vaping Use   Vaping status: Never Used  Substance and Sexual Activity   Alcohol use: Not Currently   Drug use: Never   Sexual activity: Yes    Birth control/protection: Surgical  Other Topics Concern   Not on file  Social History Narrative   Not on file   Social Drivers of Health   Financial Resource Strain: Not on file  Food Insecurity: No Food Insecurity (05/18/2022)   Hunger Vital Sign    Worried About Running Out of Food in the Last Year: Never true    Ran Out of Food in the Last Year: Never true  Transportation Needs: No Transportation Needs (05/18/2022)   PRAPARE - Administrator, Civil Service (Medical): No    Lack of Transportation (Non-Medical): No  Physical Activity: Not on file  Stress: Not on file  Social Connections: Not on file  Intimate Partner Violence: Not At Risk (05/18/2022)   Humiliation, Afraid, Rape, and Kick questionnaire    Fear of Current or Ex-Partner: No    Emotionally Abused: No    Physically Abused: No    Sexually Abused: No     Review of Systems    General:  No chills, fever, night sweats or weight changes.  Cardiovascular:  No chest pain, dyspnea on exertion, edema, orthopnea, palpitations, paroxysmal nocturnal dyspnea. Dermatological: No rash, lesions/masses Respiratory: No cough, dyspnea Urologic: No hematuria, dysuria Abdominal:   No nausea, vomiting, diarrhea, bright red blood per rectum, melena, or hematemesis Neurologic:  No visual changes, wkns, changes in mental status. All other systems reviewed and are otherwise negative except as noted above.  Physical Exam    VS:  BP  110/66   Pulse 83   Ht 5' 4 (1.626 m)   Wt 205  lb (93 kg)   SpO2 93%   BMI 35.19 kg/m  , BMI Body mass index is 35.19 kg/m. GEN: Well nourished, well developed, in no acute distress. HEENT: normal. Neck: Supple, no JVD, carotid bruits, or masses. Cardiac: RRR, no murmurs, rubs, or gallops. No clubbing, cyanosis, edema.  Radials/DP/PT 2+ and equal bilaterally.  Respiratory:  Respirations regular and unlabored, clear to auscultation bilaterally. GI: Soft, nontender, nondistended, BS + x 4. MS: no deformity or atrophy. Skin: warm and dry, no rash. Neuro:  Strength and sensation are intact. Psych: Normal affect.  Accessory Clinical Findings    Recent Labs: 09/30/2022: ALT 27; BUN 9; Creatinine, Ser 0.75; Hemoglobin 14.4; Platelets 381; Potassium 4.3; Sodium 142   Recent Lipid Panel    Component Value Date/Time   CHOL 138 05/19/2022 0452   CHOL 182 04/02/2022 0841   TRIG 68 05/19/2022 0452   HDL 46 05/19/2022 0452   HDL 47 04/02/2022 0841   CHOLHDL 3.0 05/19/2022 0452   VLDL 14 05/19/2022 0452   LDLCALC 78 05/19/2022 0452   LDLCALC 114 (H) 04/02/2022 0841         ECG personally reviewed by me today- EKG Interpretation Date/Time:  Tuesday July 06 2023 11:25:39 EDT Ventricular Rate:  83 PR Interval:  176 QRS Duration:  60 QT Interval:  372 QTC Calculation: 437 R Axis:   64  Text Interpretation: Normal sinus rhythm Normal ECG When compared with ECG of 18-May-2022 14:53, PREVIOUS ECG IS PRESENT Confirmed by Lawana Pray [phone]) on 07/06/2023 11:46:19 AM     Cardiac event monitor 08/19/2022   Normal sinus rhythm No sustained arrhythmias Symptoms of light headedness, chest pain, shortness of breath, skipped beats occurred during normal sinus rhythm   Assessment & Plan   1.  Chest discomfort-Notes  intermittent episodes of sharp chest discomfort.  These have been occurring for several weeks.  I explained that these are related to MSK type pain.   Patient reassured that her symptoms were not related to cardiac  issues. No plans for ischemic evaluation  Shortness of breath, dyspnea-Notes episodes of shortness of breath again for several weeks.  She denies sick contacts.  Episodes are intermittent and not always related to or accompanied with physical activity.  We reviewed her previous cardiac event monitor.  Euvolemic. Heart healthy low-sodium diet Increase physical activity as tolerated Smoking cessation information provided  Palpitations-occasional episodes of accelerated or irregular heartbeat.  Previous echocardiogram 7/24 showed no significant arrhythmias and normal sinus rhythm.  Symptoms occurred during normal sinus rhythm. Avoid triggers caffeine, chocolate, EtOH, dehydration etc.  Disposition: Follow-up with Dr. Lavonne Prairie or me in 12 months   Chet Cota. Larosa Rhines NP-C     07/06/2023, 11:46 AM [facility] [address] Office [phone] Fax [phone]    I spent 14 minutes examining this patient, reviewing medications, and using patient centered shared decision making involving their cardiac care.   I spent  20 minutes reviewing past medical history,  medications, and prior cardiac tests.

## 2023-07-06 ENCOUNTER — Other Ambulatory Visit

## 2023-07-06 ENCOUNTER — Encounter: Payer: Self-pay | Admitting: General Practice

## 2023-07-06 ENCOUNTER — Ambulatory Visit: Attending: General Practice | Admitting: General Practice

## 2023-07-06 VITALS — BP 110/66 | HR 83 | Ht 64.0 in | Wt 205.0 lb

## 2023-07-06 DIAGNOSIS — R002 Palpitations: Secondary | ICD-10-CM | POA: Diagnosis present

## 2023-07-06 DIAGNOSIS — R0789 Other chest pain: Secondary | ICD-10-CM | POA: Insufficient documentation

## 2023-07-06 DIAGNOSIS — R0602 Shortness of breath: Secondary | ICD-10-CM | POA: Diagnosis not present

## 2023-07-06 NOTE — Patient Instructions (Signed)
 Medication Instructions:  No medication changes were made at this visit. Continue current regimen.   *If you need a refill on your cardiac medications before your next appointment, please call your pharmacy*  Lab Work: None ordered today. If you have labs (blood work) drawn today and your tests are completely normal, you will receive your results only by: MyChart Message (if you have MyChart) OR A paper copy in the mail If you have any lab test that is abnormal or we need to change your treatment, we will call you to review the results.  Testing/Procedures: None ordered today.  Follow-Up: At [facility], you and your health needs are our priority.  As part of our continuing mission to provide you with exceptional heart care, our providers are all part of one team.  This team includes your primary Cardiologist (physician) and Advanced Practice Providers or APPs (Physician Assistants and Nurse Practitioners) who all work together to provide you with the care you need, when you need it.  Your next appointment:   1 year(s)  Provider:   Lawana Pray, NP   We recommend signing up for the patient portal called MyChart.  Sign up information is provided on this After Visit Summary.  MyChart is used to connect with patients for Virtual Visits (Telemedicine).  Patients are able to view lab/test results, encounter notes, upcoming appointments, etc.  Non-urgent messages can be sent to your provider as well.   To learn more about what you can do with MyChart, go to [email or web address].   Other Instructions

## 2023-07-07 ENCOUNTER — Other Ambulatory Visit

## 2023-07-07 DIAGNOSIS — I10 Essential (primary) hypertension: Secondary | ICD-10-CM

## 2023-07-07 DIAGNOSIS — R7303 Prediabetes: Secondary | ICD-10-CM

## 2023-07-07 DIAGNOSIS — E782 Mixed hyperlipidemia: Secondary | ICD-10-CM

## 2023-07-07 LAB — LIPID PANEL

## 2023-07-07 LAB — BAYER DCA HB A1C WAIVED: HB A1C (BAYER DCA - WAIVED): 5.5 % (ref 4.8–5.6)

## 2023-07-08 ENCOUNTER — Ambulatory Visit: Payer: Self-pay | Admitting: Family Medicine

## 2023-07-08 LAB — CBC WITH DIFFERENTIAL/PLATELET
Basophils Absolute: 0.1 10*3/uL (ref 0.0–0.2)
Basos: 1 %
EOS (ABSOLUTE): 0.5 10*3/uL — ABNORMAL HIGH (ref 0.0–0.4)
Eos: 4 %
Hematocrit: 39.5 % (ref 34.0–46.6)
Hemoglobin: 13.1 g/dL (ref 11.1–15.9)
Immature Grans (Abs): 0 10*3/uL (ref 0.0–0.1)
Immature Granulocytes: 0 %
Lymphocytes Absolute: 2.9 10*3/uL (ref 0.7–3.1)
Lymphs: 24 %
MCH: 32.3 pg (ref 26.6–33.0)
MCHC: 33.2 g/dL (ref 31.5–35.7)
MCV: 98 fL — ABNORMAL HIGH (ref 79–97)
Monocytes Absolute: 0.8 10*3/uL (ref 0.1–0.9)
Monocytes: 7 %
Neutrophils Absolute: 7.6 10*3/uL — ABNORMAL HIGH (ref 1.4–7.0)
Neutrophils: 64 %
Platelets: 407 10*3/uL (ref 150–450)
RBC: 4.05 x10E6/uL (ref 3.77–5.28)
RDW: 13.2 % (ref 11.7–15.4)
WBC: 11.8 10*3/uL — ABNORMAL HIGH (ref 3.4–10.8)

## 2023-07-08 LAB — CMP14+EGFR
ALT: 23 IU/L (ref 0–32)
AST: 21 IU/L (ref 0–40)
Albumin: 3.8 g/dL (ref 3.8–4.9)
Alkaline Phosphatase: 68 IU/L (ref 44–121)
BUN/Creatinine Ratio: 12 (ref 9–23)
BUN: 10 mg/dL (ref 6–24)
Bilirubin Total: 0.3 mg/dL (ref 0.0–1.2)
CO2: 23 mmol/L (ref 20–29)
Calcium: 9.5 mg/dL (ref 8.7–10.2)
Chloride: 101 mmol/L (ref 96–106)
Creatinine, Ser: 0.85 mg/dL (ref 0.57–1.00)
Globulin, Total: 1.9 g/dL (ref 1.5–4.5)
Glucose: 114 mg/dL — ABNORMAL HIGH (ref 70–99)
Potassium: 4 mmol/L (ref 3.5–5.2)
Sodium: 142 mmol/L (ref 134–144)
Total Protein: 5.7 g/dL — ABNORMAL LOW (ref 6.0–8.5)
eGFR: 79 mL/min/{1.73_m2} (ref >59)

## 2023-07-08 LAB — LIPID PANEL
Chol/HDL Ratio: 3.4 ratio (ref 0.0–4.4)
Cholesterol, Total: 148 mg/dL (ref 100–199)
HDL: 44 mg/dL (ref >39)
LDL Chol Calc (NIH): 76 mg/dL (ref 0–99)
Triglycerides: 166 mg/dL — ABNORMAL HIGH (ref 0–149)
VLDL Cholesterol Cal: 28 mg/dL (ref 5–40)

## 2023-07-08 LAB — TSH: TSH: 2.2 u[IU]/mL (ref 0.450–4.500)

## 2023-07-15 ENCOUNTER — Other Ambulatory Visit: Payer: Self-pay | Admitting: Family Medicine

## 2023-07-15 DIAGNOSIS — I700 Atherosclerosis of aorta: Secondary | ICD-10-CM

## 2023-07-15 DIAGNOSIS — E782 Mixed hyperlipidemia: Secondary | ICD-10-CM

## 2023-08-25 ENCOUNTER — Ambulatory Visit
Admission: RE | Admit: 2023-08-25 | Discharge: 2023-08-25 | Disposition: A | Discharge status: Home / Self Care | Payer: BC Managed Care – PPO | Source: Ambulatory Visit | Attending: Nurse Practitioner

## 2023-08-25 DIAGNOSIS — R918 Other nonspecific abnormal finding of lung field: Secondary | ICD-10-CM

## 2023-09-07 ENCOUNTER — Ambulatory Visit: Admitting: Cardiology

## 2023-09-09 ENCOUNTER — Ambulatory Visit: Payer: Self-pay | Admitting: Nurse Practitioner

## 2023-09-09 DIAGNOSIS — F17200 Nicotine dependence, unspecified, uncomplicated: Secondary | ICD-10-CM

## 2023-09-09 NOTE — Progress Notes (Signed)
 CT chest with scattered, small nodules and stable. Mild chronic plaque buildup in arteries. With smoking history, recommend yearly CT chest with lung cancer screening program, if she is a candidate.

## 2023-09-13 ENCOUNTER — Other Ambulatory Visit: Payer: Self-pay | Admitting: Family Medicine

## 2023-09-13 DIAGNOSIS — J4489 Other specified chronic obstructive pulmonary disease: Secondary | ICD-10-CM

## 2023-09-13 NOTE — Progress Notes (Signed)
 Called the pt and there was no answer- LMTCB

## 2023-09-21 ENCOUNTER — Other Ambulatory Visit: Payer: Self-pay | Admitting: Family Medicine

## 2023-09-22 ENCOUNTER — Other Ambulatory Visit: Payer: Self-pay | Admitting: Family Medicine

## 2023-09-22 DIAGNOSIS — I10 Essential (primary) hypertension: Secondary | ICD-10-CM

## 2023-09-29 ENCOUNTER — Other Ambulatory Visit: Payer: Self-pay | Admitting: Family Medicine

## 2023-09-29 DIAGNOSIS — E782 Mixed hyperlipidemia: Secondary | ICD-10-CM

## 2023-09-30 ENCOUNTER — Ambulatory Visit: Admitting: Family Medicine

## 2023-10-09 ENCOUNTER — Other Ambulatory Visit: Payer: Self-pay | Admitting: Family Medicine

## 2023-10-09 DIAGNOSIS — M62831 Muscle spasm of calf: Secondary | ICD-10-CM

## 2023-10-20 ENCOUNTER — Encounter: Payer: Self-pay | Admitting: Family Medicine

## 2023-10-20 ENCOUNTER — Ambulatory Visit: Admitting: Family Medicine

## 2023-10-25 ENCOUNTER — Ambulatory Visit: Admitting: Family Medicine

## 2023-11-01 ENCOUNTER — Telehealth: Payer: Self-pay | Admitting: *Deleted

## 2023-11-01 ENCOUNTER — Other Ambulatory Visit: Payer: Self-pay | Admitting: Family Medicine

## 2023-11-01 DIAGNOSIS — M5416 Radiculopathy, lumbar region: Secondary | ICD-10-CM

## 2023-11-01 DIAGNOSIS — M62831 Muscle spasm of calf: Secondary | ICD-10-CM

## 2023-11-01 NOTE — Telephone Encounter (Signed)
 Copied from CRM [id number]. Topic: Medical Record Request - Records Request >> Nov 01, 2023  2:41 PM Ivette P wrote: Reason for CRM: Pt called in about getting a copy medical records. Pt has a new a provider and will need records for new providing office.   Please follow up with pt regarding time frame or when ready for pick up

## 2023-11-01 NOTE — Telephone Encounter (Signed)
 Pt aware to come to office to sign release.

## 2023-12-14 ENCOUNTER — Other Ambulatory Visit: Payer: Self-pay | Admitting: Family Medicine

## 2023-12-14 DIAGNOSIS — J309 Allergic rhinitis, unspecified: Secondary | ICD-10-CM

## 2024-01-19 ENCOUNTER — Other Ambulatory Visit: Payer: Self-pay | Admitting: Family Medicine

## 2024-01-19 DIAGNOSIS — G4701 Insomnia due to medical condition: Secondary | ICD-10-CM

## 2024-01-20 IMAGING — MG MM DIGITAL SCREENING BILAT W/ TOMO AND CAD
8 series · 8 of 24 positions shown · non-contrast
Comparison: Previous exam(s).

CLINICAL DATA: Screening.

EXAM:
DIGITAL SCREENING BILATERAL MAMMOGRAM WITH TOMOSYNTHESIS AND CAD
TECHNIQUE: Bilateral screening digital craniocaudal and mediolateral oblique
mammograms were obtained. Bilateral screening digital breast
tomosynthesis was performed. The images were evaluated with
computer-aided detection.

[R MLO synth-2D]
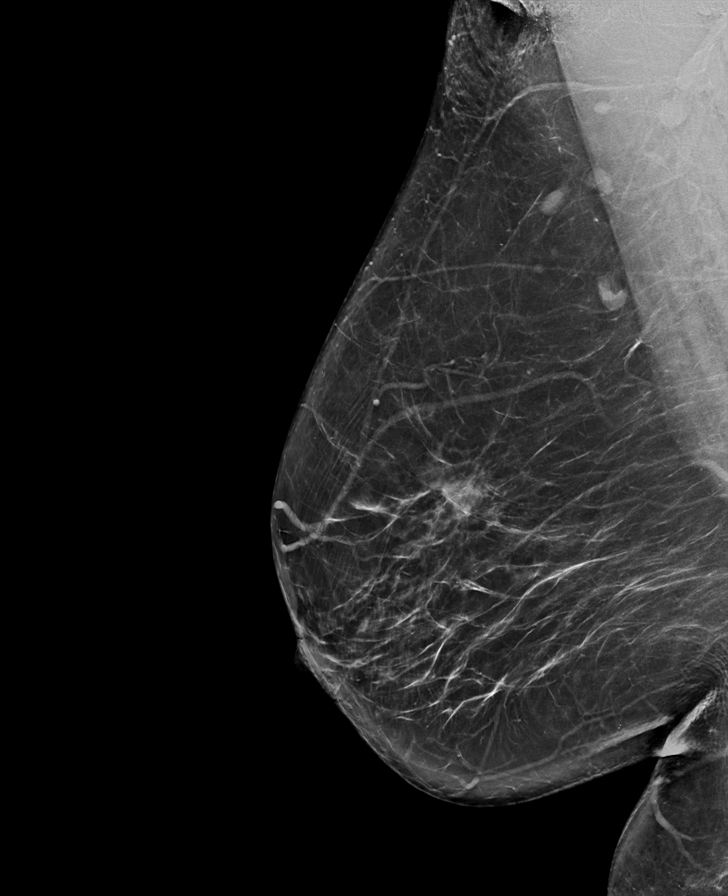

[L CC synth-2D]
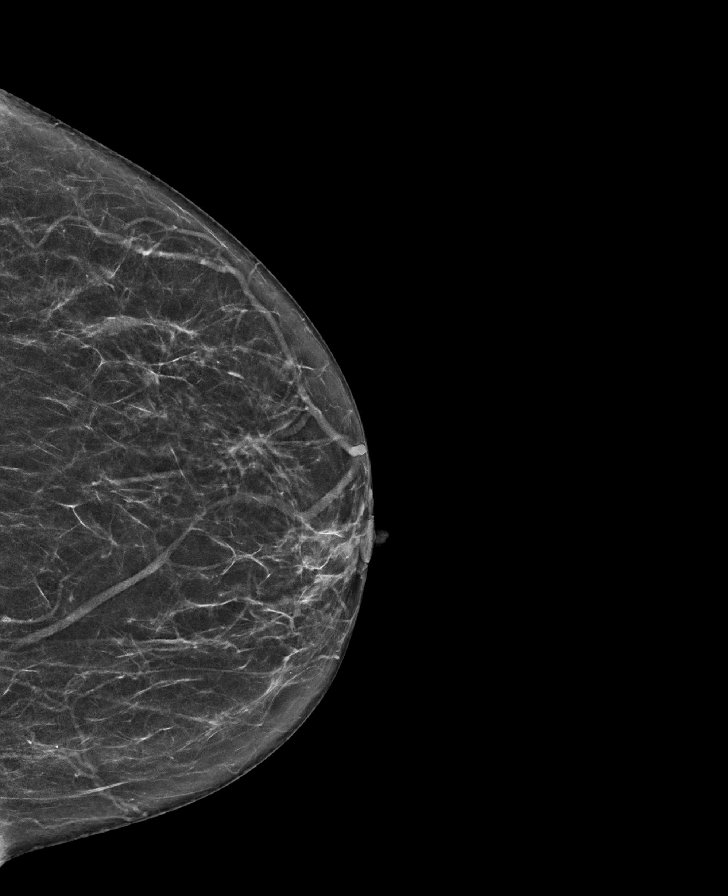

[L MLO synth-2D]
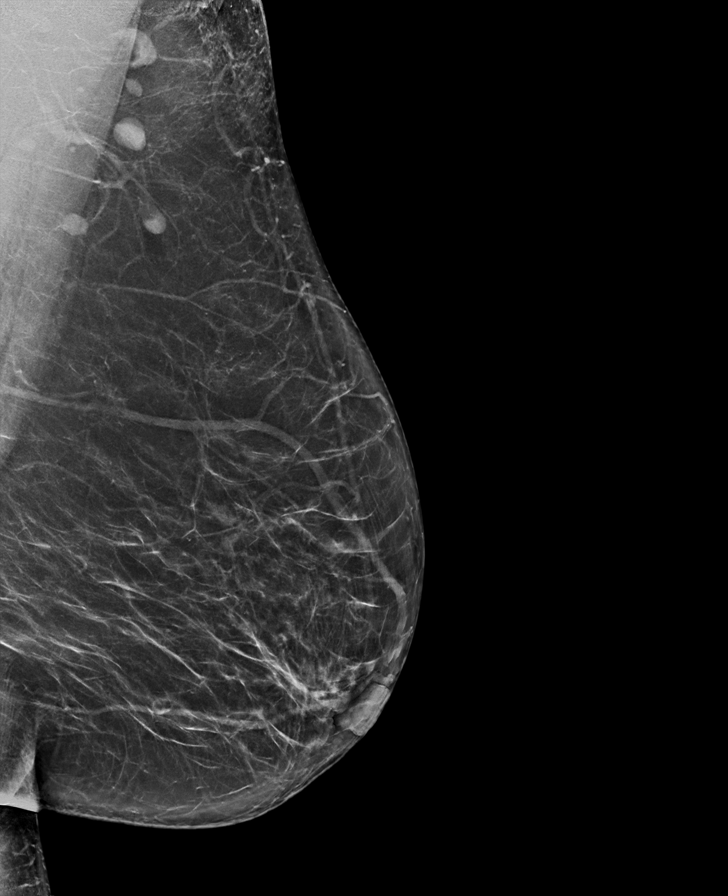

[R CC synth-2D]
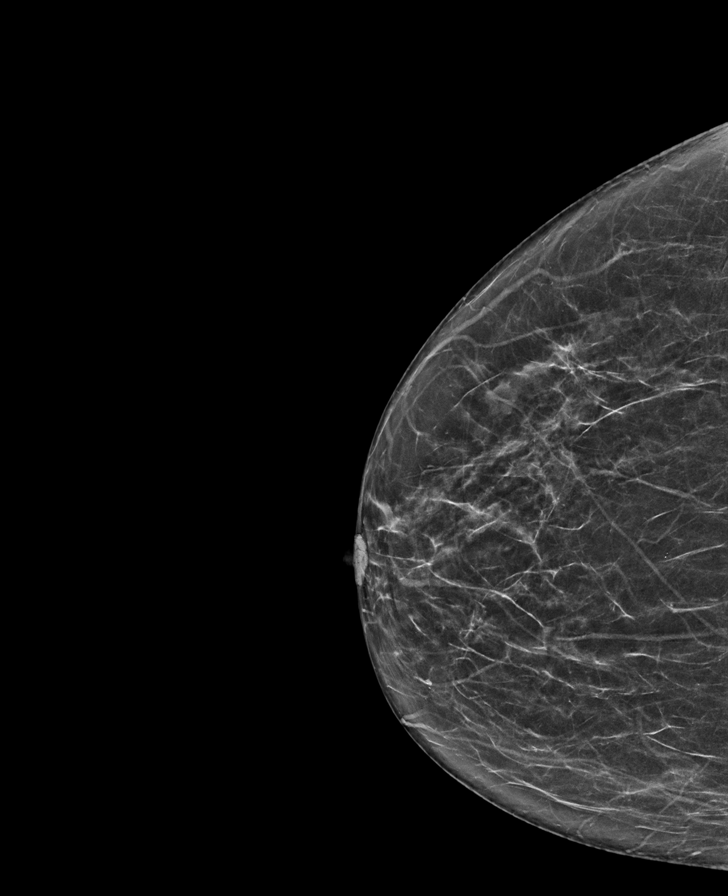

[L MLO tomo · tomo slice 41/81.0]
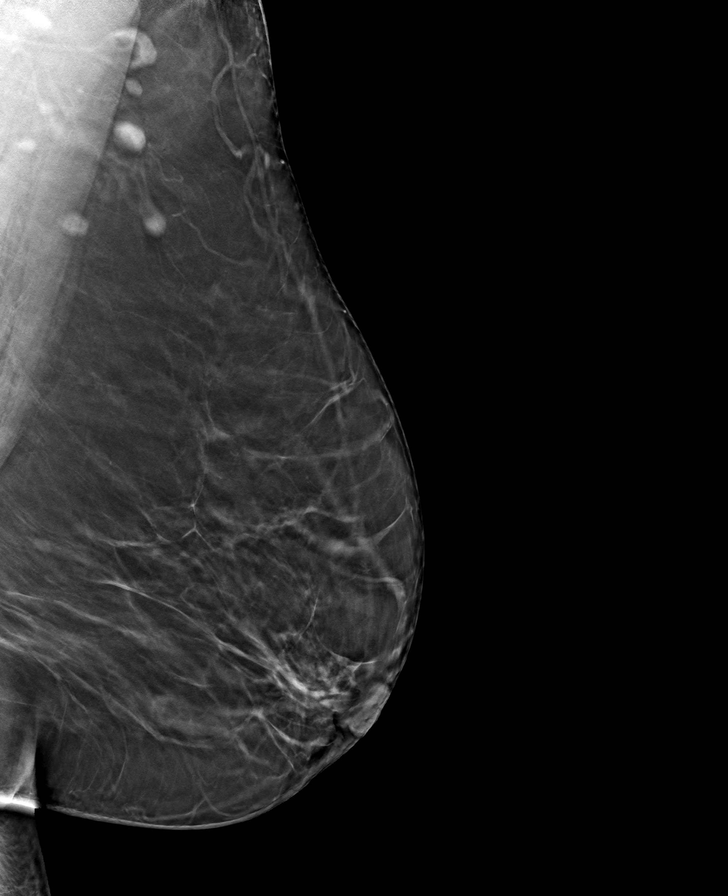

[L CC tomo · tomo slice 33/64.0]
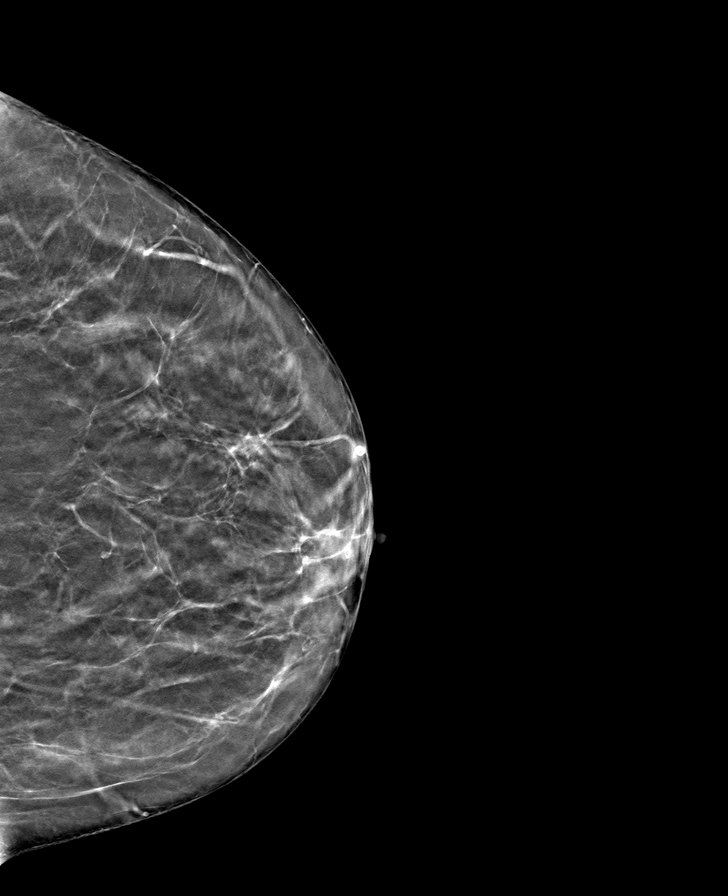

[R MLO tomo · tomo slice 43/85.0]
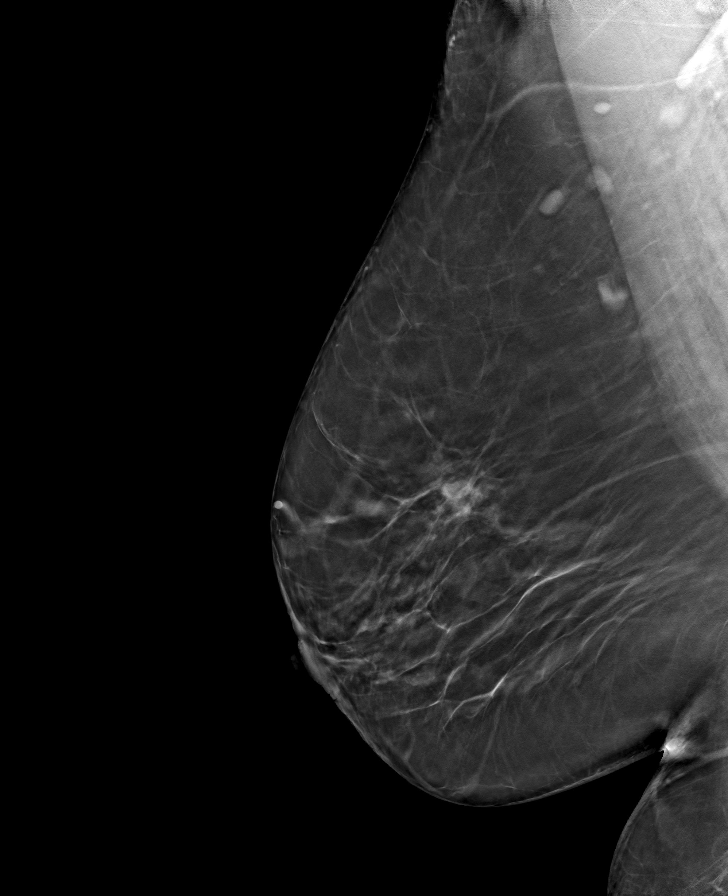

[R CC tomo · tomo slice 33/66.0]
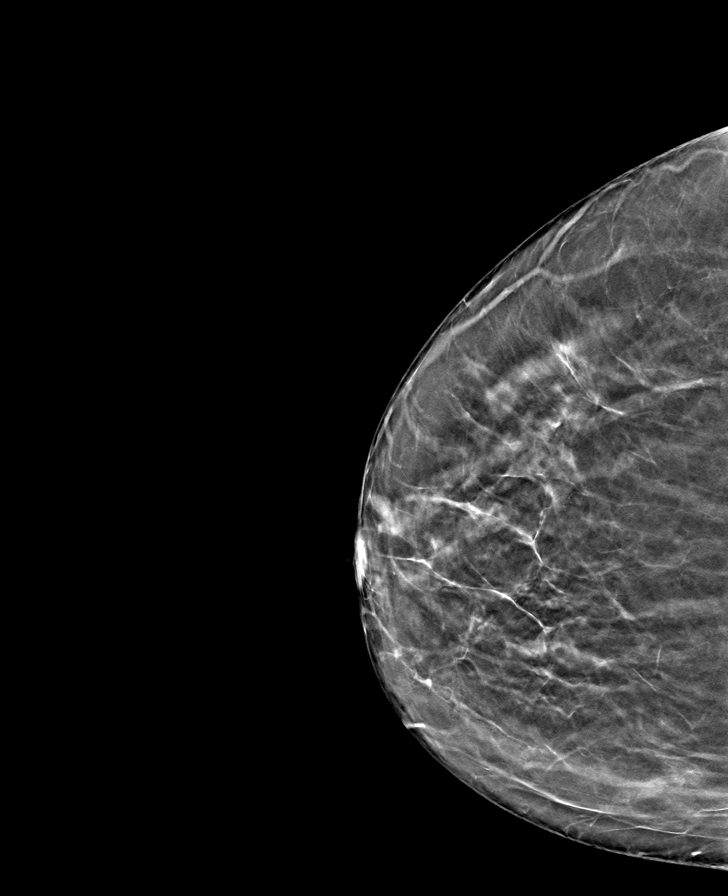

[8 of 24 positions shown; findings below may reference images not displayed]

ACR Breast Density Category b: There are scattered areas of
fibroglandular density.
FINDINGS: There are no findings suspicious for malignancy.
IMPRESSION: No mammographic evidence of malignancy. A result letter of this
screening mammogram will be mailed directly to the patient.

RECOMMENDATION:
Screening mammogram in one year. (Code:51-O-LD2)

BI-RADS CATEGORY  1: Negative.

## 2024-01-24 ENCOUNTER — Other Ambulatory Visit: Payer: Self-pay | Admitting: *Deleted

## 2024-01-24 DIAGNOSIS — I10 Essential (primary) hypertension: Secondary | ICD-10-CM
